# Patient Record
Sex: Female | Born: 1975 | Race: Black or African American | Hispanic: No | Smoking: Former smoker
Health system: Southern US, Community
[De-identification: ages and names within clinical notes are randomized; demographics above are authoritative.]

## PROBLEM LIST (undated history)

## (undated) DIAGNOSIS — E0590 Thyrotoxicosis, unspecified without thyrotoxic crisis or storm: Secondary | ICD-10-CM

## (undated) DIAGNOSIS — M169 Osteoarthritis of hip, unspecified: Secondary | ICD-10-CM

## (undated) DIAGNOSIS — F50819 Binge eating disorder, unspecified: Secondary | ICD-10-CM

## (undated) DIAGNOSIS — J302 Other seasonal allergic rhinitis: Secondary | ICD-10-CM

## (undated) DIAGNOSIS — G4730 Sleep apnea, unspecified: Secondary | ICD-10-CM

## (undated) DIAGNOSIS — K219 Gastro-esophageal reflux disease without esophagitis: Secondary | ICD-10-CM

## (undated) DIAGNOSIS — J310 Chronic rhinitis: Secondary | ICD-10-CM

## (undated) DIAGNOSIS — F32A Depression, unspecified: Secondary | ICD-10-CM

## (undated) DIAGNOSIS — L80 Vitiligo: Secondary | ICD-10-CM

## (undated) DIAGNOSIS — R7303 Prediabetes: Secondary | ICD-10-CM

## (undated) DIAGNOSIS — G4710 Hypersomnia, unspecified: Secondary | ICD-10-CM

## (undated) DIAGNOSIS — G4733 Obstructive sleep apnea (adult) (pediatric): Secondary | ICD-10-CM

## (undated) DIAGNOSIS — R32 Unspecified urinary incontinence: Secondary | ICD-10-CM

## (undated) DIAGNOSIS — E669 Obesity, unspecified: Secondary | ICD-10-CM

## (undated) DIAGNOSIS — I10 Essential (primary) hypertension: Secondary | ICD-10-CM

## (undated) DIAGNOSIS — E0500 Thyrotoxicosis with diffuse goiter without thyrotoxic crisis or storm: Secondary | ICD-10-CM

## (undated) DIAGNOSIS — F419 Anxiety disorder, unspecified: Secondary | ICD-10-CM

## (undated) HISTORY — PX: TOOTH EXTRACTION: SUR596

## (undated) HISTORY — DX: Hypersomnia, unspecified: G47.10

## (undated) HISTORY — DX: Obstructive sleep apnea (adult) (pediatric): G47.33

## (undated) HISTORY — DX: Unspecified urinary incontinence: R32

## (undated) HISTORY — DX: Other seasonal allergic rhinitis: J30.2

## (undated) HISTORY — DX: Chronic rhinitis: J31.0

## (undated) HISTORY — DX: Hypersomnia, unspecified: G47.30

## (undated) HISTORY — DX: Anxiety disorder, unspecified: F41.9

## (undated) HISTORY — DX: Thyrotoxicosis with diffuse goiter without thyrotoxic crisis or storm: E05.00

## (undated) HISTORY — DX: Vitiligo: L80

## (undated) HISTORY — DX: Thyrotoxicosis, unspecified without thyrotoxic crisis or storm: E05.90

## (undated) HISTORY — PX: CHOLECYSTECTOMY: SHX55

## (undated) HISTORY — DX: Depression, unspecified: F32.A

## (undated) HISTORY — DX: Prediabetes: R73.03

## (undated) HISTORY — DX: Osteoarthritis of hip, unspecified: M16.9

## (undated) HISTORY — DX: Obesity, unspecified: E66.9

## (undated) HISTORY — DX: Gastro-esophageal reflux disease without esophagitis: K21.9

## (undated) HISTORY — DX: Binge eating disorder, unspecified: F50.819

---

## 1998-03-18 ENCOUNTER — Emergency Department (HOSPITAL_COMMUNITY): Admission: EM | Admit: 1998-03-18 | Discharge: 1998-03-18 | Payer: Self-pay | Admitting: Emergency Medicine

## 1998-06-17 ENCOUNTER — Emergency Department (HOSPITAL_COMMUNITY): Admission: EM | Admit: 1998-06-17 | Discharge: 1998-06-17 | Payer: Self-pay | Admitting: Emergency Medicine

## 1998-11-28 ENCOUNTER — Other Ambulatory Visit: Admission: RE | Admit: 1998-11-28 | Discharge: 1998-11-28 | Payer: Self-pay | Admitting: Obstetrics

## 1999-02-06 ENCOUNTER — Other Ambulatory Visit: Admission: RE | Admit: 1999-02-06 | Discharge: 1999-02-06 | Payer: Self-pay | Admitting: Obstetrics

## 1999-03-27 ENCOUNTER — Encounter: Payer: Self-pay | Admitting: Emergency Medicine

## 1999-03-27 ENCOUNTER — Emergency Department (HOSPITAL_COMMUNITY): Admission: EM | Admit: 1999-03-27 | Discharge: 1999-03-27 | Payer: Self-pay | Admitting: Emergency Medicine

## 1999-07-26 ENCOUNTER — Emergency Department (HOSPITAL_COMMUNITY): Admission: EM | Admit: 1999-07-26 | Discharge: 1999-07-26 | Payer: Self-pay | Admitting: Obstetrics

## 1999-11-20 ENCOUNTER — Other Ambulatory Visit: Admission: RE | Admit: 1999-11-20 | Discharge: 1999-11-20 | Payer: Self-pay | Admitting: Obstetrics

## 2000-03-27 ENCOUNTER — Inpatient Hospital Stay (HOSPITAL_COMMUNITY): Admission: EM | Admit: 2000-03-27 | Discharge: 2000-03-27 | Payer: Self-pay | Admitting: Obstetrics

## 2001-05-24 ENCOUNTER — Emergency Department (HOSPITAL_COMMUNITY): Admission: EM | Admit: 2001-05-24 | Discharge: 2001-05-24 | Payer: Self-pay | Admitting: Emergency Medicine

## 2001-07-11 ENCOUNTER — Encounter: Admission: RE | Admit: 2001-07-11 | Discharge: 2001-10-03 | Payer: Self-pay | Admitting: Orthopedic Surgery

## 2001-08-08 ENCOUNTER — Emergency Department (HOSPITAL_COMMUNITY): Admission: EM | Admit: 2001-08-08 | Discharge: 2001-08-08 | Payer: Self-pay | Admitting: *Deleted

## 2001-08-14 ENCOUNTER — Encounter: Admission: RE | Admit: 2001-08-14 | Discharge: 2001-08-14 | Payer: Self-pay | Admitting: Cardiology

## 2001-08-14 ENCOUNTER — Encounter: Payer: Self-pay | Admitting: Cardiology

## 2001-11-03 ENCOUNTER — Ambulatory Visit (HOSPITAL_BASED_OUTPATIENT_CLINIC_OR_DEPARTMENT_OTHER): Admission: RE | Admit: 2001-11-03 | Discharge: 2001-11-03 | Payer: Self-pay | Admitting: Cardiology

## 2001-11-25 ENCOUNTER — Emergency Department (HOSPITAL_COMMUNITY): Admission: EM | Admit: 2001-11-25 | Discharge: 2001-11-25 | Payer: Self-pay | Admitting: Emergency Medicine

## 2002-02-05 ENCOUNTER — Ambulatory Visit (HOSPITAL_BASED_OUTPATIENT_CLINIC_OR_DEPARTMENT_OTHER): Admission: RE | Admit: 2002-02-05 | Discharge: 2002-02-05 | Payer: Self-pay | Admitting: Internal Medicine

## 2002-03-12 ENCOUNTER — Encounter: Payer: Self-pay | Admitting: Emergency Medicine

## 2002-03-12 ENCOUNTER — Emergency Department (HOSPITAL_COMMUNITY): Admission: EM | Admit: 2002-03-12 | Discharge: 2002-03-12 | Payer: Self-pay | Admitting: Emergency Medicine

## 2002-07-13 ENCOUNTER — Encounter: Payer: Self-pay | Admitting: Cardiology

## 2002-07-13 ENCOUNTER — Encounter: Admission: RE | Admit: 2002-07-13 | Discharge: 2002-07-13 | Payer: Self-pay | Admitting: Cardiology

## 2003-01-10 ENCOUNTER — Emergency Department (HOSPITAL_COMMUNITY): Admission: EM | Admit: 2003-01-10 | Discharge: 2003-01-10 | Payer: Self-pay | Admitting: Emergency Medicine

## 2003-03-25 ENCOUNTER — Emergency Department (HOSPITAL_COMMUNITY): Admission: EM | Admit: 2003-03-25 | Discharge: 2003-03-26 | Payer: Self-pay

## 2004-04-29 ENCOUNTER — Emergency Department (HOSPITAL_COMMUNITY): Admission: EM | Admit: 2004-04-29 | Discharge: 2004-04-29 | Payer: Self-pay | Admitting: Emergency Medicine

## 2006-06-30 ENCOUNTER — Inpatient Hospital Stay (HOSPITAL_COMMUNITY): Admission: AD | Admit: 2006-06-30 | Discharge: 2006-07-03 | Payer: Self-pay | Admitting: Obstetrics

## 2008-01-31 ENCOUNTER — Emergency Department (HOSPITAL_COMMUNITY): Admission: EM | Admit: 2008-01-31 | Discharge: 2008-01-31 | Payer: Self-pay | Admitting: Emergency Medicine

## 2010-06-11 ENCOUNTER — Emergency Department (HOSPITAL_COMMUNITY): Admission: EM | Admit: 2010-06-11 | Discharge: 2010-06-11 | Payer: Self-pay | Admitting: Emergency Medicine

## 2010-10-16 ENCOUNTER — Ambulatory Visit
Admission: RE | Admit: 2010-10-16 | Discharge: 2010-10-16 | Payer: Self-pay | Source: Home / Self Care | Attending: Internal Medicine | Admitting: Internal Medicine

## 2010-10-16 DIAGNOSIS — G4710 Hypersomnia, unspecified: Secondary | ICD-10-CM | POA: Insufficient documentation

## 2010-10-16 DIAGNOSIS — G4730 Sleep apnea, unspecified: Secondary | ICD-10-CM

## 2010-10-17 ENCOUNTER — Encounter: Payer: Self-pay | Admitting: Internal Medicine

## 2010-10-17 ENCOUNTER — Ambulatory Visit (HOSPITAL_BASED_OUTPATIENT_CLINIC_OR_DEPARTMENT_OTHER)
Admission: RE | Admit: 2010-10-17 | Discharge: 2010-10-17 | Payer: Self-pay | Source: Home / Self Care | Attending: Internal Medicine | Admitting: Internal Medicine

## 2010-11-01 ENCOUNTER — Ambulatory Visit
Admission: RE | Admit: 2010-11-01 | Discharge: 2010-11-01 | Payer: Self-pay | Source: Home / Self Care | Attending: Internal Medicine | Admitting: Internal Medicine

## 2010-11-01 DIAGNOSIS — R404 Transient alteration of awareness: Secondary | ICD-10-CM | POA: Insufficient documentation

## 2010-11-09 NOTE — Assessment & Plan Note (Signed)
Summary: sleep study f/u ///kp   Copy to:  Maurice Small MD Primary Provider/Referring Provider:  Maurice Small MD  CC:  Follow upvisit-sleep study results..  History of Present Illness:  CC:  Sleep Consult. Pt states she hasn't used her cpap for more than 10 years. Pt c/o fatigue, frequent migraines, and short term memory focusing. Marland Kitchen  History of Present Illness: October 16, 2010- Sleep Consult at kind request of Dr Maurice Small for this 6 yoF with obstructive sleep apnea. . Pt states she hasn't used her cpap for more than 10 years. Pt c/o fatigue, frequent migraines, short term memory focusing, loud snoring Former patient at the old practice. NPSG 11/03/01 RDI 45/hr with CPAP to 15.  She didn't returnfor follow-up after finding CPAP uncomfortable. She now admits tiredness while driving or sitting quietly. Bedtime 10-12MN, latency 1-2 minutes, waking 1-2 times before waking at 6 AM. No ENT surgery except dental extraction.   November 01, 2010- OSA Nurse-CC: Follow up visit-sleep study results. NPSG- 10/17/10- AHI 0.9/hr- some leg jerks, but not OSA. Dozes off easily in car. Bedtime around 1130, up 7AM sleeps through night but tired everyday. It interferes with on-line school. Never an alert time of day. No caffeine- migraine trigger. Thyroid ok. Work sits at [employer]. Denies cataplexy.   Preventive Screening-Counseling & Management  Alcohol-Tobacco     Smoking Status: quit     Packs/Day: 0.5     Year Started: early 20's     Year Quit: 2001  Current Medications (verified): 1)  Tylenol Allergy Sinus 2-30-500 Mg Tabs (Chlorphen-Pseudoephed-Apap) .... As Needed 2)  Fluticasone Propionate 50 Mcg/act Susp (Fluticasone Propionate) .Marland Kitchen.. 1 Puff Each Nostrile Once Daily 3)  Sprintec 28 0.25-35 Mg-Mcg Tabs (Norgestimate-Eth Estradiol) .... Once Daily 4)  Imitrex 25 Mg Tabs (Sumatriptan Succinate) .... As Needed  Allergies (verified): No Known Drug Allergies  Past History:  Past  Surgical History: Last updated: 10/16/2010 Cholecystectomy tooth extraction  Family History: Last updated: 10/16/2010 diabetes: father, mother  Father- died DM htn: mother, brother COPD: mother high cholesterol: brother Rheumatism--mother  Social History: Last updated: 10/16/2010 Patient states former smoker. 2001. 1/2 ppd Occupation: [employer] married- husband has had second dual lung transplant children: 3 natural, 3 step  Risk Factors: Smoking Status: quit (11/01/2010) Packs/Day: 0.5 (11/01/2010)  Past Medical History: Asthma Rhinitis GERD Migraine Hypersomnia with obstructive sleep apnea- NPSG 11/05/01- AHI 45/hr                                                                        NPSG 10/17/10- WNL AHI 0.9/hr  Review of Systems      See HPI  The patient denies shortness of breath with activity, shortness of breath at rest, productive cough, non-productive cough, coughing up blood, chest pain, irregular heartbeats, acid heartburn, indigestion, loss of appetite, weight change, abdominal pain, difficulty swallowing, sore throat, tooth/dental problems, headaches, nasal congestion/difficulty breathing through nose, and sneezing.    Vital Signs:  Patient profile:   35 year old female Height:      64 inches Weight:      303.25 pounds BMI:     52.24 O2 Sat:      100 % on Room air Pulse rate:  77 / minute BP sitting:   122 / 82  (left arm) Cuff size:   large  Vitals Entered By: Reynaldo Minium CMA (November 01, 2010 9:50 AM)  O2 Flow:  Room air CC: Follow upvisit-sleep study results.   Physical Exam  Additional Exam:  General: A/Ox3; pleasant and cooperative, NAD, obese, intelligent  SKIN: no rash, lesions NODES: no lymphadenopathy HEENT: [place]/AT, EOM- WNL, Conjuctivae- clear, PERRLA, TM-WNL, Nose- clear, Throat- clear and wnl, Mallampati  IV NECK: Supple w/ fair ROM, JVD- none, normal carotid impulses w/o bruits Thyroid- normal to palpation CHEST: Clear to  P&A, clear, unlabored, no cough or wheeze HEART: RRR, no m/g/r heard ABDOMEN: Soft and nl;  ZOX:WRUE, nl pulses, no edema  NEURO: Grossly intact to observation      Impression & Recommendations:  Problem # 1:  HYPERSOMNIA WITH SLEEP APNEA UNSPECIFIED (ICD-780.53)  We can't demonstrate OSA now. We have worked on sleep hygiene and ways including contolled naps, exercise, caffeine, to stay awake in daytime especially for driving safety and work. We will have her try light dose caffeine. Considering need for an MSLT looking for ideopathic hypersomnia. She assures me thyroid tested normal. Weight loss and regular exercise may help. Disucssed how to control naps at work.   Other Orders: Est. Patient Level IV (45409)  Patient Instructions: 1)  Please schedule a follow-up appointment in 6 months. 2)  If the sslpeepiness doesn't get better, then we can discuss a different kind of sleep test.  3)  Try contoilled naps at lunch time 4)  Try light use of caffeine as tolerated. One way is to get caffeine tablet "caplets" otc. You can break these in half to adjust dose and take a couple of times a day if needed.

## 2010-11-09 NOTE — Assessment & Plan Note (Signed)
Summary: f/u and reset cpap/jd   Visit Type:  Initial Consult Copy to:  Maurice Small MD Primary Provider/Referring Provider:  Maurice Small MD  CC:  Sleep Consult. Pt states she hasn't used her cpap for more than 10 years. Pt c/o fatigue, frequent migraines, and short term memory focusing. Marland Kitchen  History of Present Illness: October 16, 2010- Sleep Consult at kind request of Dr Maurice Small for this 35 yoF with obstructive sleep apnea. . Pt states she hasn't used her cpap for more than 10 years. Pt c/o fatigue, frequent migraines, short term memory focusing, loud snoring Former patient at the old practice. NPSG 11/03/01 RDI 45/hr with CPAP to 15.  She didn't returnfor follow-up after finding CPAP uncomfortable. She now admits tiredness while driving or sitting quietly. Bedtime 10-12MN, latency 1-2 minutes, waking 1-2 times before waking at 6 AM. No ENT surgery except dental extraction.   Preventive Screening-Counseling & Management  Alcohol-Tobacco     Smoking Status: quit     Packs/Day: 0.5     Year Started: early 20's     Year Quit: 2001  Current Medications (verified): 1)  Tylenol Allergy Sinus 2-30-500 Mg Tabs (Chlorphen-Pseudoephed-Apap) .... As Needed 2)  Fluticasone Propionate 50 Mcg/act Susp (Fluticasone Propionate) .Marland Kitchen.. 1 Puff Each Nostrile Once Daily 3)  Sprintec 28 0.25-35 Mg-Mcg Tabs (Norgestimate-Eth Estradiol) .... Once Daily 4)  Imitrex 25 Mg Tabs (Sumatriptan Succinate) .... As Needed  Allergies (verified): No Known Drug Allergies  Past History:  Family History: Last updated: 10/16/2010 diabetes: father, mother  Father- died DM htn: mother, brother COPD: mother high cholesterol: brother Rheumatism--mother  Social History: Last updated: 10/16/2010 Patient states former smoker. 2001. 1/2 ppd Occupation: [employer] married- husband has had second dual lung transplant children: 3 natural, 3 step  Risk Factors: Smoking Status: quit  (10/16/2010) Packs/Day: 0.5 (10/16/2010)  Past Medical History: Asthma Rhinitis GERD Migraine Obstructive sleep apnea.  Past Surgical History: Cholecystectomy tooth extraction  Family History: diabetes: father, mother  Father- died DM htn: mother, brother COPD: mother high cholesterol: brother Rheumatism--mother  Social History: Patient states former smoker. 2001. 1/2 ppd Occupation: [employer] married- husband has had second dual lung transplant children: 3 natural, 3 step Packs/Day:  0.5  Review of Systems      See HPI       The patient complains of headaches.  The patient denies shortness of breath with activity, shortness of breath at rest, productive cough, non-productive cough, coughing up blood, chest pain, irregular heartbeats, acid heartburn, indigestion, loss of appetite, weight change, abdominal pain, difficulty swallowing, sore throat, tooth/dental problems, nasal congestion/difficulty breathing through nose, sneezing, itching, ear ache, anxiety, depression, hand/feet swelling, joint stiffness or pain, rash, change in color of mucus, and fever.    Vital Signs:  Patient profile:   35 year old female Height:      64 inches Weight:      297.50 pounds BMI:     51.25 O2 Sat:      100 % on Room air Pulse rate:   88 / minute BP sitting:   160 / 90  (left arm) Cuff size:   large  Vitals Entered By: Carver Fila (October 16, 2010 3:48 PM)  O2 Flow:  Room air CC: Sleep Consult. Pt states she hasn't used her cpap for more than 10 years. Pt c/o fatigue, frequent migraines, short term memory focusing.  Comments meds and allergies updated Phone number updated [facility]  October 16, 2010 3:48 PM    Physical Exam  Additional Exam:  General: A/Ox3; pleasant and cooperative, NAD, obese, intelligent  SKIN: no rash, lesions NODES: no lymphadenopathy HEENT: [place]/AT, EOM- WNL, Conjuctivae- clear, PERRLA, TM-WNL, Nose- clear, Throat- clear and wnl, Mallampati   IV NECK: Supple w/ fair ROM, JVD- none, normal carotid impulses w/o bruits Thyroid- normal to palpation CHEST: Clear to P&A, clear, unlabored, no cough or wheeze HEART: RRR, no m/g/r heard ABDOMEN: Soft and nl; nml bowel sounds; no organomegaly or masses noted QIO:NGEX, nl pulses, no edema  NEURO: Grossly intact to observation      Impression & Recommendations:  Problem # 1:  HYPERSOMNIA WITH SLEEP APNEA UNSPECIFIED (ICD-780.53)  We reviewed the physiology and diagnostic process and will get an updated sleep study since her last was 8 years ago. We discussed weight loss as a goal and discussed her responsibility to drive safely.   Medications Added to Medication List This Visit: 1)  Imitrex 25 Mg Tabs (Sumatriptan succinate) .... As needed  Other Orders: Est. Patient Level IV (52841) Sleep Study (Sleep Study)  Patient Instructions: 1)  Please schedule a follow-up appointment in 1 month. 2)  See [facility] to schedule sleep study.    Immunization History:  Influenza Immunization History:    Influenza:  historical (07/08/2010)

## 2011-05-02 ENCOUNTER — Ambulatory Visit: Payer: Self-pay | Admitting: Internal Medicine

## 2012-01-28 ENCOUNTER — Other Ambulatory Visit (HOSPITAL_COMMUNITY): Payer: Self-pay | Admitting: Obstetrics

## 2012-01-28 DIAGNOSIS — Z1231 Encounter for screening mammogram for malignant neoplasm of breast: Secondary | ICD-10-CM

## 2012-02-20 ENCOUNTER — Ambulatory Visit (HOSPITAL_COMMUNITY): Payer: Self-pay | Attending: Obstetrics

## 2013-02-14 ENCOUNTER — Emergency Department (HOSPITAL_COMMUNITY): Payer: 59

## 2013-02-14 ENCOUNTER — Emergency Department (HOSPITAL_COMMUNITY)
Admission: EM | Admit: 2013-02-14 | Discharge: 2013-02-14 | Disposition: A | Discharge status: Home / Self Care | Payer: 59 | Attending: Emergency Medicine | Admitting: Emergency Medicine

## 2013-02-14 ENCOUNTER — Encounter (HOSPITAL_COMMUNITY): Payer: Self-pay

## 2013-02-14 DIAGNOSIS — Z8719 Personal history of other diseases of the digestive system: Secondary | ICD-10-CM | POA: Insufficient documentation

## 2013-02-14 DIAGNOSIS — Z8669 Personal history of other diseases of the nervous system and sense organs: Secondary | ICD-10-CM | POA: Insufficient documentation

## 2013-02-14 DIAGNOSIS — J45909 Unspecified asthma, uncomplicated: Secondary | ICD-10-CM | POA: Insufficient documentation

## 2013-02-14 DIAGNOSIS — G8929 Other chronic pain: Secondary | ICD-10-CM

## 2013-02-14 DIAGNOSIS — G43909 Migraine, unspecified, not intractable, without status migrainosus: Secondary | ICD-10-CM | POA: Insufficient documentation

## 2013-02-14 DIAGNOSIS — M25579 Pain in unspecified ankle and joints of unspecified foot: Secondary | ICD-10-CM | POA: Insufficient documentation

## 2013-02-14 DIAGNOSIS — Z87891 Personal history of nicotine dependence: Secondary | ICD-10-CM | POA: Insufficient documentation

## 2013-02-14 DIAGNOSIS — J310 Chronic rhinitis: Secondary | ICD-10-CM | POA: Insufficient documentation

## 2013-02-14 DIAGNOSIS — Z79899 Other long term (current) drug therapy: Secondary | ICD-10-CM | POA: Insufficient documentation

## 2013-02-14 MED ORDER — TRAMADOL HCL 50 MG PO TABS
50.0000 mg | ORAL_TABLET | Freq: Once | ORAL | Status: AC
  Administered 2013-02-14: 50 mg via ORAL
  Filled 2013-02-14: qty 1

## 2013-02-14 MED ORDER — TRAMADOL HCL 50 MG PO TABS: 50.0000 mg | ORAL_TABLET | Freq: Four times a day (QID) | ORAL | Status: DC | PRN

## 2013-02-14 MED ORDER — IBUPROFEN 200 MG PO TABS
400.0000 mg | ORAL_TABLET | Freq: Once | ORAL | Status: AC
  Administered 2013-02-14: 400 mg via ORAL
  Filled 2013-02-14: qty 2

## 2013-02-14 NOTE — ED Notes (Signed)
Pt c/o LT ankle pain "for a while."  States she was dx'd w/bursitis.  States she did jumping jacks yesterday and made it worse.

## 2013-02-14 NOTE — ED Provider Notes (Signed)
History     CSN: [id number]  Arrival date & time 02/14/13  1722   First MD Initiated Contact with Patient 02/14/13 1736      Chief Complaint  Patient presents with  . Ankle Pain    (Consider location/radiation/quality/duration/timing/severity/associated sxs/prior treatment) HPI  Alicia Hoover is a 37 y.o. female complaining of exacerbation of left Achilles pain over the last 24 hours. Patient states she has a history of bursitis in that area she was jumping rope with her child yesterday and the pain has become more severe, it is rated as 6/10, she has not taken any pain medication. She denies rolling the ankle, numbness, paresthesia.  Past Medical History  Diagnosis Date  . Asthma   . Rhinitis   . GERD (gastroesophageal reflux disease)   . Migraine   . Hypersomnia with sleep apnea     Past Surgical History  Procedure Laterality Date  . Cholecystectomy    . Tooth extraction      Family History  Problem Relation Age of Onset  . Diabetes Father   . Diabetes Mother   . COPD Mother   . Hyperlipidemia Brother   . Rheum arthritis Mother     History  Substance Use Topics  . Smoking status: Former Smoker    Quit date: 10/09/1999  . Smokeless tobacco: Not on file  . Alcohol Use: No    OB History   Grav Para Term Preterm Abortions TAB SAB Ect Mult Living                  Review of Systems  Constitutional: Negative for fever.  Respiratory: Negative for shortness of breath.   Cardiovascular: Negative for chest pain.  Gastrointestinal: Negative for nausea, vomiting, abdominal pain and diarrhea.  Musculoskeletal: Positive for arthralgias.  All other systems reviewed and are negative.    Allergies  Review of patient's allergies indicates not on file.  Home Medications   Current Outpatient Rx  Name  Route  Sig  Dispense  Refill  . chlorpheniramine-pseudoephedrine-acetaminophen (SINUTAB) 2-30-500 MG per tablet   Oral   Take 1 tablet by mouth every 4 (four)  hours as needed.           . fluticasone (FLOVENT DISKUS) 50 MCG/BLIST diskus inhaler   Inhalation   Inhale 1 puff into the lungs daily.           . norgestimate-ethinyl estradiol (SPRINTEC 28) 0.25-35 MG-MCG per tablet   Oral   Take 1 tablet by mouth daily.           . SUMAtriptan (IMITREX) 25 MG tablet   Oral   Take 25 mg by mouth as needed.             BP 124/72  Pulse 98  Temp(Src) 98.2 F (36.8 C) (Oral)  SpO2 99%  LMP 01/23/2013  Physical Exam  Nursing note and vitals reviewed. Constitutional: She is oriented to person, place, and time. She appears well-developed and well-nourished. No distress.  HENT:  Head: Normocephalic.  Eyes: Conjunctivae and EOM are normal.  Cardiovascular: Normal rate.   Pulmonary/Chest: Effort normal. No stridor.  Musculoskeletal: Normal range of motion.  Patient and relates with mildly antalgic gait. No deformity to the left ankle, neurovascularly intact, excellent range of motion to toes and ankle. Patient has tenderness to palpation of the Achilles tendon, pain with dorsi flexion.   Neurological: She is alert and oriented to person, place, and time.  Psychiatric: She has a normal  mood and affect.    ED Course  Procedures (including critical care time)  Labs Reviewed - No data to display Dg Ankle Complete Left  02/14/2013  *RADIOLOGY REPORT*  Clinical Data: Heel pain.  LEFT ANKLE COMPLETE - 3+ VIEW  Comparison: None.  Findings: Mild soft tissue swelling is present over lateral malleolus.  There is no underlying fracture.  Degenerative changes are noted at the Achilles tendon insertion.  The calcaneus is otherwise unremarkable.  There is no significant ankle effusion. The ankle joint is located.  IMPRESSION:  1.  Mild soft tissue swelling over lateral malleolus without an underlying fracture. 2.  No acute abnormality of the calcaneus.   Original Report Authenticated By: Marin Roberts, M.D.      1. Ankle pain, chronic, left        MDM   Alicia Hoover is a 37 y.o. female with exacerbation of chronic left Achilles tendon pain. Plain films are negative. Pain is well-controlled with Motrin and tramadol. Orthopedic followup given.    Filed Vitals:   02/14/13 1735  BP: 124/72  Pulse: 98  Temp: 98.2 F (36.8 C)  TempSrc: Oral  SpO2: 99%     VSS and patient is appropriate for, and amenable to, discharge at this time. Pt verbalized understanding and agrees with care plan. Outpatient follow-up and return precautions given.    Discharge Medication List as of 02/14/2013  6:46 PM    START taking these medications   Details  traMADol (ULTRAM) 50 MG tablet Take 1 tablet (50 mg total) by mouth every 6 (six) hours as needed for pain., Starting 02/14/2013, Until Discontinued, Print              Alicia Emery, PA-C 02/14/13 1949

## 2013-02-14 NOTE — ED Notes (Signed)
Pt reports she was exercising, using jump rope yesterday and she has L ankle pain rated 6/10 today.

## 2013-02-15 NOTE — ED Provider Notes (Signed)
Medical screening examination/treatment/procedure(s) were performed by non-physician practitioner and as supervising physician I was immediately available for consultation/collaboration.  Journee Bobrowski, MD 02/15/13 1542 

## 2013-02-19 ENCOUNTER — Other Ambulatory Visit (HOSPITAL_COMMUNITY): Payer: Self-pay | Admitting: Obstetrics

## 2013-02-19 DIAGNOSIS — Z1231 Encounter for screening mammogram for malignant neoplasm of breast: Secondary | ICD-10-CM

## 2013-02-20 ENCOUNTER — Ambulatory Visit (HOSPITAL_COMMUNITY)
Admission: RE | Admit: 2013-02-20 | Discharge: 2013-02-20 | Disposition: A | Discharge status: Home / Self Care | Payer: 59 | Source: Ambulatory Visit | Attending: Obstetrics | Admitting: Obstetrics

## 2013-02-20 DIAGNOSIS — Z1231 Encounter for screening mammogram for malignant neoplasm of breast: Secondary | ICD-10-CM | POA: Insufficient documentation

## 2013-06-18 ENCOUNTER — Emergency Department (HOSPITAL_COMMUNITY)
Admission: EM | Admit: 2013-06-18 | Discharge: 2013-06-18 | Disposition: A | Discharge status: Home / Self Care | Payer: 59 | Attending: Emergency Medicine | Admitting: Emergency Medicine

## 2013-06-18 ENCOUNTER — Encounter (HOSPITAL_COMMUNITY): Payer: Self-pay | Admitting: Family Medicine

## 2013-06-18 DIAGNOSIS — M545 Low back pain, unspecified: Secondary | ICD-10-CM | POA: Insufficient documentation

## 2013-06-18 DIAGNOSIS — Z87891 Personal history of nicotine dependence: Secondary | ICD-10-CM | POA: Insufficient documentation

## 2013-06-18 DIAGNOSIS — J45909 Unspecified asthma, uncomplicated: Secondary | ICD-10-CM | POA: Insufficient documentation

## 2013-06-18 DIAGNOSIS — I10 Essential (primary) hypertension: Secondary | ICD-10-CM | POA: Insufficient documentation

## 2013-06-18 DIAGNOSIS — Z8719 Personal history of other diseases of the digestive system: Secondary | ICD-10-CM | POA: Insufficient documentation

## 2013-06-18 DIAGNOSIS — Z79899 Other long term (current) drug therapy: Secondary | ICD-10-CM | POA: Insufficient documentation

## 2013-06-18 DIAGNOSIS — IMO0002 Reserved for concepts with insufficient information to code with codable children: Secondary | ICD-10-CM | POA: Insufficient documentation

## 2013-06-18 HISTORY — DX: Essential (primary) hypertension: I10

## 2013-06-18 MED ORDER — METHOCARBAMOL 500 MG PO TABS: 500.0000 mg | ORAL_TABLET | Freq: Two times a day (BID) | ORAL | Status: DC | PRN

## 2013-06-18 MED ORDER — OXYCODONE-ACETAMINOPHEN 5-325 MG PO TABS
2.0000 | ORAL_TABLET | Freq: Once | ORAL | Status: AC
  Administered 2013-06-18: 2 via ORAL
  Filled 2013-06-18: qty 2

## 2013-06-18 MED ORDER — TRAMADOL HCL 50 MG PO TABS: 50.0000 mg | ORAL_TABLET | Freq: Four times a day (QID) | ORAL | Status: DC | PRN

## 2013-06-18 MED ORDER — MELOXICAM 7.5 MG PO TABS: 15.0000 mg | ORAL_TABLET | Freq: Every day | ORAL | Status: DC

## 2013-06-18 NOTE — ED Provider Notes (Signed)
Medical screening examination/treatment/procedure(s) were performed by non-physician practitioner and as supervising physician I was immediately available for consultation/collaboration.  Artez Regis M Jobe Mutch, MD 06/18/13 0644 

## 2013-06-18 NOTE — ED Notes (Signed)
Patient states that she has had lower left side back pain for 3 days. States pain in left leg with ambulation. Denies dysuria or fever. Took Tylenol at 945pm and put Flector patch on her back without relief of symptoms.

## 2013-06-18 NOTE — ED Provider Notes (Signed)
CSN: [id number]     Arrival date & time 06/18/13  0034 History   First MD Initiated Contact with Patient 06/18/13 0054     Chief Complaint  Patient presents with  . Back Pain   (Consider location/radiation/quality/duration/timing/severity/associated sxs/prior Treatment) HPI Comments: Patient is a 37 y/o female who presents for L lumbar back pain x 3 days. Patient states that pain is intermittent, throbbing/sharp, and radiating to her L hip. Patient states pain is improved with nonmovement and worsened when ambulating. She has tried OTC antiinflammatories without relief. She denies any trauma or injury inciting her pain. Patient further denies associated fever, dysuria, hematuria, N/V, diarrhea, vaginal c/o, perianal numbness, bowel/bladder incontinence, numbess/tingling, and a hx of CA and IVDU. Patient has been ambulatory without assistance.  Patient is a 37 y.o. female presenting with back pain. The history is provided by the patient. No language interpreter was used.  Back Pain Associated symptoms: no dysuria, no fever, no numbness and no weakness     Past Medical History  Diagnosis Date  . Asthma   . Rhinitis   . GERD (gastroesophageal reflux disease)   . Migraine   . Hypersomnia with sleep apnea   . Hypertension    Past Surgical History  Procedure Laterality Date  . Cholecystectomy    . Tooth extraction     Family History  Problem Relation Age of Onset  . Diabetes Father   . Diabetes Mother   . COPD Mother   . Hyperlipidemia Brother   . Rheum arthritis Mother    History  Substance Use Topics  . Smoking status: Former Smoker    Quit date: 10/09/1999  . Smokeless tobacco: Not on file  . Alcohol Use: No   OB History   Grav Para Term Preterm Abortions TAB SAB Ect Mult Living                 Review of Systems  Constitutional: Negative for fever.  Gastrointestinal: Negative for nausea and vomiting.  Genitourinary: Negative for dysuria.  Musculoskeletal: Positive  for back pain.  Neurological: Negative for weakness and numbness.  All other systems reviewed and are negative.    Allergies  Codeine  Home Medications   Current Outpatient Rx  Name  Route  Sig  Dispense  Refill  . benazepril-hydrochlorthiazide (LOTENSIN HCT) 20-12.5 MG per tablet   Oral   Take 1 tablet by mouth daily.         . cetirizine (ZYRTEC) 10 MG tablet   Oral   Take 10 mg by mouth daily.         . montelukast (SINGULAIR) 10 MG tablet   Oral   Take 10 mg by mouth at bedtime.         . norgestimate-ethinyl estradiol (SPRINTEC 28) 0.25-35 MG-MCG per tablet   Oral   Take 1 tablet by mouth daily.           . fluticasone (FLONASE) 50 MCG/ACT nasal spray   Nasal   Place 2 sprays into the nose daily as needed for allergies.          . meloxicam (MOBIC) 7.5 MG tablet   Oral   Take 2 tablets (15 mg total) by mouth daily.   30 tablet   0   . methocarbamol (ROBAXIN) 500 MG tablet   Oral   Take 1 tablet (500 mg total) by mouth 2 (two) times daily as needed.   20 tablet   0   . traMADol (ULTRAM)  50 MG tablet   Oral   Take 1 tablet (50 mg total) by mouth every 6 (six) hours as needed for pain.   13 tablet   0    BP 118/78  Pulse 74  Temp(Src) 98.5 F (36.9 C) (Oral)  Resp 21  Ht 5\' 5"  (1.651 m)  Wt 315 lb (142.883 kg)  BMI 52.42 kg/m2  SpO2 96%  LMP 06/14/2013  Physical Exam  Nursing note and vitals reviewed. Constitutional: She is oriented to person, place, and time. She appears well-developed and well-nourished. No distress.  HENT:  Head: Normocephalic and atraumatic.  Eyes: Conjunctivae and EOM are normal. No scleral icterus.  Neck: Normal range of motion.  Cardiovascular: Normal rate, regular rhythm and intact distal pulses.   DP and PT pulses 2+ bilaterally  Pulmonary/Chest: Effort normal. No respiratory distress.  Musculoskeletal: Normal range of motion.  Tenderness to palpation of the left lumbar paraspinal muscles. No tenderness to  palpation of the lumbosacral midline. No bony deformities or step-offs palpated.  Neurological: She is alert and oriented to person, place, and time.  No sensory or motor deficits appreciated. DTRs normal and symmetric.  Skin: Skin is warm and dry. No rash noted. She is not diaphoretic. No erythema. No pallor.  Psychiatric: She has a normal mood and affect. Her behavior is normal.    ED Course  Procedures (including critical care time) Labs Review Labs Reviewed - No data to display  Imaging Review No results found.  MDM   1. Back pain, lumbosacral    Uncomplicated low-back pain. Patient well and nontoxic appearing, hemodynamically stable, and afebrile. Physical exam findings as above. Patient neurovascularly intact. No red flags or signs concerning for cauda equina. Patient denies history of cancer and IV drug use. Do not believe further workup with imaging is warranted at this time. Patient appropriate for discharge with instructions for rest, ice to the affected area, and anti-inflammatories as well as muscle relaxer for symptoms. Short course of tramadol prescribed for severe pain. Return precautions discussed and patient agreeable to plan with no unaddressed concerns.    Antony Madura, PA-C 06/18/13 [phone]

## 2013-08-13 ENCOUNTER — Other Ambulatory Visit: Payer: Self-pay

## 2013-12-11 ENCOUNTER — Emergency Department (HOSPITAL_COMMUNITY)
Admission: EM | Admit: 2013-12-11 | Discharge: 2013-12-11 | Discharge status: Absent without leave | Payer: 59 | Source: Home / Self Care

## 2013-12-29 ENCOUNTER — Emergency Department (HOSPITAL_COMMUNITY)
Admission: EM | Admit: 2013-12-29 | Discharge: 2013-12-29 | Disposition: A | Discharge status: Home / Self Care | Payer: 59 | Attending: Emergency Medicine | Admitting: Emergency Medicine

## 2013-12-29 ENCOUNTER — Encounter (HOSPITAL_COMMUNITY): Payer: Self-pay | Admitting: Emergency Medicine

## 2013-12-29 DIAGNOSIS — I10 Essential (primary) hypertension: Secondary | ICD-10-CM | POA: Insufficient documentation

## 2013-12-29 DIAGNOSIS — S4980XA Other specified injuries of shoulder and upper arm, unspecified arm, initial encounter: Secondary | ICD-10-CM | POA: Insufficient documentation

## 2013-12-29 DIAGNOSIS — S46909A Unspecified injury of unspecified muscle, fascia and tendon at shoulder and upper arm level, unspecified arm, initial encounter: Secondary | ICD-10-CM | POA: Insufficient documentation

## 2013-12-29 DIAGNOSIS — S0993XA Unspecified injury of face, initial encounter: Secondary | ICD-10-CM | POA: Insufficient documentation

## 2013-12-29 DIAGNOSIS — Z8719 Personal history of other diseases of the digestive system: Secondary | ICD-10-CM | POA: Insufficient documentation

## 2013-12-29 DIAGNOSIS — Z87891 Personal history of nicotine dependence: Secondary | ICD-10-CM | POA: Insufficient documentation

## 2013-12-29 DIAGNOSIS — J45909 Unspecified asthma, uncomplicated: Secondary | ICD-10-CM | POA: Insufficient documentation

## 2013-12-29 DIAGNOSIS — Z79899 Other long term (current) drug therapy: Secondary | ICD-10-CM | POA: Insufficient documentation

## 2013-12-29 DIAGNOSIS — T148XXA Other injury of unspecified body region, initial encounter: Secondary | ICD-10-CM

## 2013-12-29 DIAGNOSIS — S199XXA Unspecified injury of neck, initial encounter: Secondary | ICD-10-CM

## 2013-12-29 DIAGNOSIS — Y92410 Unspecified street and highway as the place of occurrence of the external cause: Secondary | ICD-10-CM | POA: Insufficient documentation

## 2013-12-29 DIAGNOSIS — IMO0002 Reserved for concepts with insufficient information to code with codable children: Secondary | ICD-10-CM | POA: Insufficient documentation

## 2013-12-29 DIAGNOSIS — H538 Other visual disturbances: Secondary | ICD-10-CM | POA: Insufficient documentation

## 2013-12-29 DIAGNOSIS — Y9389 Activity, other specified: Secondary | ICD-10-CM | POA: Insufficient documentation

## 2013-12-29 DIAGNOSIS — Z791 Long term (current) use of non-steroidal anti-inflammatories (NSAID): Secondary | ICD-10-CM | POA: Insufficient documentation

## 2013-12-29 MED ORDER — METHOCARBAMOL 500 MG PO TABS: 1000.0000 mg | ORAL_TABLET | Freq: Four times a day (QID) | ORAL | Status: DC

## 2013-12-29 MED ORDER — KETOROLAC TROMETHAMINE 60 MG/2ML IM SOLN
60.0000 mg | Freq: Once | INTRAMUSCULAR | Status: AC
  Administered 2013-12-29: 60 mg via INTRAMUSCULAR
  Filled 2013-12-29: qty 2

## 2013-12-29 MED ORDER — NAPROXEN 500 MG PO TABS: 500.0000 mg | ORAL_TABLET | Freq: Two times a day (BID) | ORAL | Status: DC
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
## 2013-12-30 NOTE — ED Provider Notes (Signed)
  Medical screening examination/treatment/procedure(s) were performed by non-physician practitioner and as supervising physician I was immediately available for consultation/collaboration.   EKG Interpretation None         Carmin Muskrat, MD 12/30/13 0002

## 2014-01-20 LAB — HM PAP SMEAR: HM Pap smear: NORMAL

## 2014-02-24 ENCOUNTER — Other Ambulatory Visit (INDEPENDENT_AMBULATORY_CARE_PROVIDER_SITE_OTHER): Payer: 59

## 2014-02-24 ENCOUNTER — Ambulatory Visit: Payer: 59 | Admitting: Physician Assistant

## 2014-02-24 ENCOUNTER — Encounter: Payer: Self-pay | Admitting: Internal Medicine

## 2014-02-24 ENCOUNTER — Ambulatory Visit (INDEPENDENT_AMBULATORY_CARE_PROVIDER_SITE_OTHER): Payer: 59 | Admitting: Internal Medicine

## 2014-02-24 VITALS — BP 138/82 | HR 81 | Temp 98.5°F | Resp 16 | Ht 65.5 in | Wt 321.0 lb

## 2014-02-24 DIAGNOSIS — I10 Essential (primary) hypertension: Secondary | ICD-10-CM

## 2014-02-24 DIAGNOSIS — R404 Transient alteration of awareness: Secondary | ICD-10-CM

## 2014-02-24 DIAGNOSIS — A5901 Trichomonal vulvovaginitis: Secondary | ICD-10-CM | POA: Insufficient documentation

## 2014-02-24 DIAGNOSIS — J309 Allergic rhinitis, unspecified: Secondary | ICD-10-CM

## 2014-02-24 DIAGNOSIS — Z0000 Encounter for general adult medical examination without abnormal findings: Secondary | ICD-10-CM | POA: Insufficient documentation

## 2014-02-24 DIAGNOSIS — G4710 Hypersomnia, unspecified: Secondary | ICD-10-CM

## 2014-02-24 DIAGNOSIS — G4730 Sleep apnea, unspecified: Principal | ICD-10-CM

## 2014-02-24 LAB — COMPREHENSIVE METABOLIC PANEL
ALK PHOS: 57 U/L (ref 39–117)
ALT: 23 U/L (ref 0–35)
AST: 19 U/L (ref 0–37)
Albumin: 3.6 g/dL (ref 3.5–5.2)
BILIRUBIN TOTAL: 0.3 mg/dL (ref 0.2–1.2)
BUN: 13 mg/dL (ref 6–23)
CALCIUM: 8.8 mg/dL (ref 8.4–10.5)
CHLORIDE: 103 meq/L (ref 96–112)
CO2: 29 mEq/L (ref 19–32)
CREATININE: 0.9 mg/dL (ref 0.4–1.2)
GFR: 89.63 mL/min (ref >60.00)
Glucose, Bld: 85 mg/dL (ref 70–99)
Potassium: 3.8 mEq/L (ref 3.5–5.1)
Sodium: 138 mEq/L (ref 135–145)
Total Protein: 6.7 g/dL (ref 6.0–8.3)

## 2014-02-24 LAB — CBC WITH DIFFERENTIAL/PLATELET
BASOS PCT: 0.4 % (ref 0.0–3.0)
Basophils Absolute: 0 10*3/uL (ref 0.0–0.1)
Eosinophils Absolute: 0.2 10*3/uL (ref 0.0–0.7)
Eosinophils Relative: 1.9 % (ref 0.0–5.0)
HEMATOCRIT: 38.5 % (ref 36.0–46.0)
Hemoglobin: 12.3 g/dL (ref 12.0–15.0)
LYMPHS ABS: 2.8 10*3/uL (ref 0.7–4.0)
LYMPHS PCT: 31.7 % (ref 12.0–46.0)
MCHC: 31.9 g/dL (ref 30.0–36.0)
MCV: 76.2 fl — ABNORMAL LOW (ref 78.0–100.0)
Monocytes Absolute: 0.6 10*3/uL (ref 0.1–1.0)
Monocytes Relative: 7 % (ref 3.0–12.0)
NEUTROS ABS: 5.2 10*3/uL (ref 1.4–7.7)
Neutrophils Relative %: 59 % (ref 43.0–77.0)
Platelets: 374 10*3/uL (ref 150.0–400.0)
RBC: 5.06 Mil/uL (ref 3.87–5.11)
RDW: 15.3 % (ref 11.5–15.5)
WBC: 8.9 10*3/uL (ref 4.0–10.5)

## 2014-02-24 LAB — URINALYSIS, ROUTINE W REFLEX MICROSCOPIC
BILIRUBIN URINE: NEGATIVE
Ketones, ur: NEGATIVE
NITRITE: NEGATIVE
Specific Gravity, Urine: 1.015 (ref 1.000–1.030)
Urine Glucose: NEGATIVE
Urobilinogen, UA: 0.2 (ref 0.0–1.0)
pH: 7.5 (ref 5.0–8.0)

## 2014-02-24 LAB — LIPID PANEL
Cholesterol: 127 mg/dL (ref 0–200)
HDL: 40.6 mg/dL (ref >39.00)
LDL CALC: 66 mg/dL (ref 0–99)
TRIGLYCERIDES: 100 mg/dL (ref 0.0–149.0)
Total CHOL/HDL Ratio: 3
VLDL: 20 mg/dL (ref 0.0–40.0)

## 2014-02-24 LAB — HCG, QUANTITATIVE, PREGNANCY: hCG, Beta Chain, Quant, S: 0.67 m[IU]/mL

## 2014-02-24 LAB — TSH: TSH: 1.83 u[IU]/mL (ref 0.35–4.50)

## 2014-02-24 LAB — HEMOGLOBIN A1C: Hgb A1c MFr Bld: 6.1 % (ref 4.6–6.5)

## 2014-02-24 MED ORDER — METRONIDAZOLE 500 MG PO TABS
500.0000 mg | ORAL_TABLET | Freq: Three times a day (TID) | ORAL | Status: AC
Start: 2014-02-24 — End: 2014-03-06

## 2014-02-24 MED ORDER — NALTREXONE-BUPROPION HCL ER 8-90 MG PO TB12: 2.0000 | ORAL_TABLET | Freq: Two times a day (BID) | ORAL | Status: DC

## 2014-02-24 NOTE — Assessment & Plan Note (Signed)
I think she has a lot of s/s related to this so I have asked her to f/up with sleep medicine

## 2014-02-24 NOTE — Assessment & Plan Note (Signed)
F/up with sleep med

## 2014-02-24 NOTE — Progress Notes (Signed)
Subjective:    Patient ID: Alicia Hoover, female    DOB: 10-26-1975, 38 y.o.   MRN: [id number]  Hypertension This is a chronic problem. The current episode started more than 1 year ago. The problem has been gradually improving since onset. The problem is controlled. Associated symptoms include headaches (she has headaches in the AM that resolve during the day) and malaise/fatigue. Pertinent negatives include no anxiety, blurred vision, chest pain, neck pain, orthopnea, palpitations, peripheral edema, PND, shortness of breath or sweats. Past treatments include ACE inhibitors and diuretics. The current treatment provides moderate improvement. Compliance problems include diet and exercise.  Identifiable causes of hypertension include sleep apnea.      Review of Systems  Constitutional: Positive for malaise/fatigue, fatigue and unexpected weight change (wt gain). Negative for fever, chills, diaphoresis, activity change and appetite change.  HENT: Positive for congestion, postnasal drip, rhinorrhea and sneezing. Negative for ear pain, nosebleeds, sinus pressure, sore throat, tinnitus, trouble swallowing and voice change.   Eyes: Negative.  Negative for blurred vision.  Respiratory: Positive for apnea. Negative for cough, choking, chest tightness, shortness of breath, wheezing and stridor.   Cardiovascular: Negative.  Negative for chest pain, palpitations, orthopnea, leg swelling and PND.  Gastrointestinal: Negative.  Negative for nausea, vomiting, abdominal pain, constipation and blood in stool.  Endocrine: Negative.   Genitourinary: Negative.  Negative for difficulty urinating.  Musculoskeletal: Negative.  Negative for neck pain.  Skin: Negative.   Allergic/Immunologic: Negative.   Neurological: Positive for headaches (she has headaches in the AM that resolve during the day). Negative for dizziness, tremors, seizures, syncope, facial asymmetry, speech difficulty, weakness, light-headedness and  numbness.  Hematological: Negative.  Negative for adenopathy. Does not bruise/bleed easily.  Psychiatric/Behavioral: Negative.        Objective:   Physical Exam  Vitals reviewed. Constitutional: She is oriented to person, place, and time. She appears well-developed and well-nourished. No distress.  HENT:  Head: Normocephalic and atraumatic.  Right Ear: Hearing, tympanic membrane, external ear and ear canal normal.  Left Ear: Hearing, tympanic membrane, external ear and ear canal normal.  Nose: Mucosal edema present. No rhinorrhea, nose lacerations, sinus tenderness, nasal deformity, septal deviation or nasal septal hematoma. No epistaxis.  No foreign bodies. Right sinus exhibits no maxillary sinus tenderness and no frontal sinus tenderness. Left sinus exhibits no maxillary sinus tenderness and no frontal sinus tenderness.  Mouth/Throat: Oropharynx is clear and moist and mucous membranes are normal. Mucous membranes are not pale, not dry and not cyanotic. No oral lesions. No trismus in the jaw. No uvula swelling. No oropharyngeal exudate, posterior oropharyngeal edema, posterior oropharyngeal erythema or tonsillar abscesses.  Eyes: Conjunctivae are normal. Right eye exhibits no discharge. Left eye exhibits no discharge. No scleral icterus.  Neck: Normal range of motion. Neck supple. No JVD present. No tracheal deviation present. No thyromegaly present.  Cardiovascular: Normal rate, regular rhythm, normal heart sounds and intact distal pulses.  Exam reveals no gallop and no friction rub.   No murmur heard. Pulmonary/Chest: Effort normal and breath sounds normal. No stridor. No respiratory distress. She has no wheezes. She has no rales. She exhibits no tenderness.  Abdominal: Soft. Bowel sounds are normal. She exhibits no distension and no mass. There is no tenderness. There is no rebound and no guarding.  Musculoskeletal: Normal range of motion. She exhibits no edema and no tenderness.    Lymphadenopathy:    She has no cervical adenopathy.  Neurological: She is alert and oriented to  person, place, and time. She has normal reflexes. She displays normal reflexes. No cranial nerve deficit. She exhibits normal muscle tone. Coordination normal.  Skin: Skin is warm and dry. No rash noted. She is not diaphoretic. No erythema. No pallor.  Psychiatric: She has a normal mood and affect. Her behavior is normal. Judgment and thought content normal.     No results found for this basename: WBC, HGB, HCT, PLT, GLUCOSE, CHOL, TRIG, HDL, LDLDIRECT, LDLCALC, ALT, AST, NA, K, CL, CREATININE, BUN, CO2, TSH, PSA, INR, GLUF, HGBA1C, MICROALBUR       Assessment & Plan:

## 2014-02-24 NOTE — Assessment & Plan Note (Signed)
Her BP is well controlled I will monitor her lytes and renal function today 

## 2014-02-24 NOTE — Assessment & Plan Note (Signed)
She is ready to lose weight so I have asked her to try contrave and to cont working on her lifestyle modifications

## 2014-02-24 NOTE — Progress Notes (Signed)
Pre visit review using our clinic review tool, if applicable. No additional management support is needed unless otherwise documented below in the visit note. 

## 2014-02-24 NOTE — Assessment & Plan Note (Signed)
Exam done Vaccines were documented Labs ordered Pt ed material was given

## 2014-02-24 NOTE — Addendum Note (Signed)
Addended by: [name] on: 02/24/2014 02:00 PM   Modules accepted: Orders

## 2014-02-24 NOTE — Patient Instructions (Signed)
Preventive Care for Adults, Female A healthy lifestyle and preventive care can promote health and wellness. Preventive health guidelines for women include the following key practices.  A routine yearly physical is a good way to check with your health care provider about your health and preventive screening. It is a chance to share any concerns and updates on your health and to receive a thorough exam.  Visit your dentist for a routine exam and preventive care every 6 months. Brush your teeth twice a day and floss once a day. Good oral hygiene prevents tooth decay and gum disease.  The frequency of eye exams is based on your age, health, family medical history, use of contact lenses, and other factors. Follow your health care provider's recommendations for frequency of eye exams.  Eat a healthy diet. Foods like vegetables, fruits, whole grains, low-fat dairy products, and lean protein foods contain the nutrients you need without too many calories. Decrease your intake of foods high in solid fats, added sugars, and salt. Eat the right amount of calories for you.Get information about a proper diet from your health care provider, if necessary.  Regular physical exercise is one of the most important things you can do for your health. Most adults should get at least 150 minutes of moderate-intensity exercise (any activity that increases your heart rate and causes you to sweat) each week. In addition, most adults need muscle-strengthening exercises on 2 or more days a week.  Maintain a healthy weight. The body mass index (BMI) is a screening tool to identify possible weight problems. It provides an estimate of body fat based on height and weight. Your health care provider can find your BMI, and can help you achieve or maintain a healthy weight.For adults 20 years and older:  A BMI below 18.5 is considered underweight.  A BMI of 18.5 to 24.9 is normal.  A BMI of 25 to 29.9 is considered overweight.  A  BMI of 30 and above is considered obese.  Maintain normal blood lipids and cholesterol levels by exercising and minimizing your intake of saturated fat. Eat a balanced diet with plenty of fruit and vegetables. Blood tests for lipids and cholesterol should begin at age 62 and be repeated every 5 years. If your lipid or cholesterol levels are high, you are over 50, or you are at high risk for heart disease, you may need your cholesterol levels checked more frequently.Ongoing high lipid and cholesterol levels should be treated with medicines if diet and exercise are not working.  If you smoke, find out from your health care provider how to quit. If you do not use tobacco, do not start.  Lung cancer screening is recommended for adults aged 36 80 years who are at high risk for developing lung cancer because of a history of smoking. A yearly low-dose CT scan of the lungs is recommended for people who have at least a 30-pack-year history of smoking and are a current smoker or have quit within the past 15 years. A pack year of smoking is smoking an average of 1 pack of cigarettes a day for 1 year (for example: 1 pack a day for 30 years or 2 packs a day for 15 years). Yearly screening should continue until the smoker has stopped smoking for at least 15 years. Yearly screening should be stopped for people who develop a health problem that would prevent them from having lung cancer treatment.  If you are pregnant, do not drink alcohol. If you  are breastfeeding, be very cautious about drinking alcohol. If you are not pregnant and choose to drink alcohol, do not have more than 1 drink per day. One drink is considered to be 12 ounces (355 mL) of beer, 5 ounces (148 mL) of wine, or 1.5 ounces (44 mL) of liquor.  Avoid use of street drugs. Do not share needles with anyone. Ask for help if you need support or instructions about stopping the use of drugs.  High blood pressure causes heart disease and increases the risk  of stroke. Your blood pressure should be checked at least every 1 to 2 years. Ongoing high blood pressure should be treated with medicines if weight loss and exercise do not work.  If you are 39 39 years old, ask your health care provider if you should take aspirin to prevent strokes.  Diabetes screening involves taking a blood sample to check your fasting blood sugar level. This should be done once every 3 years, after age 56, if you are within normal weight and without risk factors for diabetes. Testing should be considered at a younger age or be carried out more frequently if you are overweight and have at least 1 risk factor for diabetes.  Breast cancer screening is essential preventive care for women. You should practice "breast self-awareness." This means understanding the normal appearance and feel of your breasts and may include breast self-examination. Any changes detected, no matter how small, should be reported to a health care provider. Women in their 40s and 30s should have a clinical breast exam (CBE) by a health care provider as part of a regular health exam every 1 to 3 years. After age 28, women should have a CBE every year. Starting at age 72, women should consider having a mammogram (breast X-ray test) every year. Women who have a family history of breast cancer should talk to their health care provider about genetic screening. Women at a high risk of breast cancer should talk to their health care providers about having an MRI and a mammogram every year.  Breast cancer gene (BRCA)-related cancer risk assessment is recommended for women who have family members with BRCA-related cancers. BRCA-related cancers include breast, ovarian, tubal, and peritoneal cancers. Having family members with these cancers may be associated with an increased risk for harmful changes (mutations) in the breast cancer genes BRCA1 and BRCA2. Results of the assessment will determine the need for genetic counseling  and BRCA1 and BRCA2 testing.  The Pap test is a screening test for cervical cancer. A Pap test can show cell changes on the cervix that might become cervical cancer if left untreated. A Pap test is a procedure in which cells are obtained and examined from the lower end of the uterus (cervix).  Women should have a Pap test starting at age 59.  Between ages 42 and 13, Pap tests should be repeated every 2 years.  Beginning at age 53, you should have a Pap test every 3 years as long as the past 3 Pap tests have been normal.  Some women have medical problems that increase the chance of getting cervical cancer. Talk to your health care provider about these problems. It is especially important to talk to your health care provider if a new problem develops soon after your last Pap test. In these cases, your health care provider may recommend more frequent screening and Pap tests.  The above recommendations are the same for women who have or have not gotten the vaccine  for human papillomavirus (HPV).  If you had a hysterectomy for a problem that was not cancer or a condition that could lead to cancer, then you no longer need Pap tests. Even if you no longer need a Pap test, a regular exam is a good idea to make sure no other problems are starting.  If you are between ages 58 and 10 years, and you have had normal Pap tests going back 10 years, you no longer need Pap tests. Even if you no longer need a Pap test, a regular exam is a good idea to make sure no other problems are starting.  If you have had past treatment for cervical cancer or a condition that could lead to cancer, you need Pap tests and screening for cancer for at least 20 years after your treatment.  If Pap tests have been discontinued, risk factors (such as a new sexual partner) need to be reassessed to determine if screening should be resumed.  The HPV test is an additional test that may be used for cervical cancer screening. The HPV test  looks for the virus that can cause the cell changes on the cervix. The cells collected during the Pap test can be tested for HPV. The HPV test could be used to screen women aged 67 years and older, and should be used in women of any age who have unclear Pap test results. After the age of 65, women should have HPV testing at the same frequency as a Pap test.  Colorectal cancer can be detected and often prevented. Most routine colorectal cancer screening begins at the age of 25 years and continues through age 66 years. However, your health care provider may recommend screening at an earlier age if you have risk factors for colon cancer. On a yearly basis, your health care provider may provide home test kits to check for hidden blood in the stool. Use of a small camera at the end of a tube, to directly examine the colon (sigmoidoscopy or colonoscopy), can detect the earliest forms of colorectal cancer. Talk to your health care provider about this at age 79, when routine screening begins. Direct exam of the colon should be repeated every 5 10 years through age 47 years, unless early forms of pre-cancerous polyps or small growths are found.  People who are at an increased risk for hepatitis B should be screened for this virus. You are considered at high risk for hepatitis B if:  You were born in a country where hepatitis B occurs often. Talk with your health care provider about which countries are considered high risk.  Your parents were born in a high-risk country and you have not received a shot to protect against hepatitis B (hepatitis B vaccine).  You have HIV or AIDS.  You use needles to inject street drugs.  You live with, or have sex with, someone who has Hepatitis B.  You get hemodialysis treatment.  You take certain medicines for conditions like cancer, organ transplantation, and autoimmune conditions.  Hepatitis C blood testing is recommended for all people born from [birth year] through 1965 and  any individual with known risks for hepatitis C.  Practice safe sex. Use condoms and avoid high-risk sexual practices to reduce the spread of sexually transmitted infections (STIs). STIs include gonorrhea, chlamydia, syphilis, trichomonas, herpes, HPV, and human immunodeficiency virus (HIV). Herpes, HIV, and HPV are viral illnesses that have no cure. They can result in disability, cancer, and death. Sexually active women aged 66  years and younger should be checked for chlamydia. Older women with new or multiple partners should also be tested for chlamydia. Testing for other STIs is recommended if you are sexually active and at increased risk.  Osteoporosis is a disease in which the bones lose minerals and strength with aging. This can result in serious bone fractures or breaks. The risk of osteoporosis can be identified using a bone density scan. Women ages 18 years and over and women at risk for fractures or osteoporosis should discuss screening with their health care providers. Ask your health care provider whether you should take a calcium supplement or vitamin D to reduce the rate of osteoporosis.  Menopause can be associated with physical symptoms and risks. Hormone replacement therapy is available to decrease symptoms and risks. You should talk to your health care provider about whether hormone replacement therapy is right for you.  Use sunscreen. Apply sunscreen liberally and repeatedly throughout the day. You should seek shade when your shadow is shorter than you. Protect yourself by wearing long sleeves, pants, a wide-brimmed hat, and sunglasses year round, whenever you are outdoors.  Once a month, do a whole body skin exam, using a mirror to look at the skin on your back. Tell your health care provider of new moles, moles that have irregular borders, moles that are larger than a pencil eraser, or moles that have changed in shape or color.  Stay current with required vaccines  (immunizations).  Influenza vaccine. All adults should be immunized every year.  Tetanus, diphtheria, and acellular pertussis (Td, Tdap) vaccine. Pregnant women should receive 1 dose of Tdap vaccine during each pregnancy. The dose should be obtained regardless of the length of time since the last dose. Immunization is preferred during the 27th 36th week of gestation. An adult who has not previously received Tdap or who does not know her vaccine status should receive 1 dose of Tdap. This initial dose should be followed by tetanus and diphtheria toxoids (Td) booster doses every 10 years. Adults with an unknown or incomplete history of completing a 3-dose immunization series with Td-containing vaccines should begin or complete a primary immunization series including a Tdap dose. Adults should receive a Td booster every 10 years.  Varicella vaccine. An adult without evidence of immunity to varicella should receive 2 doses or a second dose if she has previously received 1 dose. Pregnant females who do not have evidence of immunity should receive the first dose after pregnancy. This first dose should be obtained before leaving the health care facility. The second dose should be obtained 4 8 weeks after the first dose.  Human papillomavirus (HPV) vaccine. Females aged 9 26 years who have not received the vaccine previously should obtain the 3-dose series. The vaccine is not recommended for use in pregnant females. However, pregnancy testing is not needed before receiving a dose. If a female is found to be pregnant after receiving a dose, no treatment is needed. In that case, the remaining doses should be delayed until after the pregnancy. Immunization is recommended for any person with an immunocompromised condition through the age of 51 years if she did not get any or all doses earlier. During the 3-dose series, the second dose should be obtained 4 8 weeks after the first dose. The third dose should be obtained  24 weeks after the first dose and 16 weeks after the second dose.  Zoster vaccine. One dose is recommended for adults aged 57 years or older unless certain  conditions are present.  Measles, mumps, and rubella (MMR) vaccine. Adults born before [birth year] generally are considered immune to measles and mumps. Adults born in [birth year] or later should have 1 or more doses of MMR vaccine unless there is a contraindication to the vaccine or there is laboratory evidence of immunity to each of the three diseases. A routine second dose of MMR vaccine should be obtained at least 28 days after the first dose for students attending postsecondary schools, health care workers, or international travelers. People who received inactivated measles vaccine or an unknown type of measles vaccine during 1963 1967 should receive 2 doses of MMR vaccine. People who received inactivated mumps vaccine or an unknown type of mumps vaccine before 1979 and are at high risk for mumps infection should consider immunization with 2 doses of MMR vaccine. For females of childbearing age, rubella immunity should be determined. If there is no evidence of immunity, females who are not pregnant should be vaccinated. If there is no evidence of immunity, females who are pregnant should delay immunization until after pregnancy. Unvaccinated health care workers born before [birth year] who lack laboratory evidence of measles, mumps, or rubella immunity or laboratory confirmation of disease should consider measles and mumps immunization with 2 doses of MMR vaccine or rubella immunization with 1 dose of MMR vaccine.  Pneumococcal 13-valent conjugate (PCV13) vaccine. When indicated, a person who is uncertain of her immunization history and has no record of immunization should receive the PCV13 vaccine. An adult aged 42 years or older who has certain medical conditions and has not been previously immunized should receive 1 dose of PCV13 vaccine. This PCV13 should be followed  with a dose of pneumococcal polysaccharide (PPSV23) vaccine. The PPSV23 vaccine dose should be obtained at least 8 weeks after the dose of PCV13 vaccine. An adult aged 4 years or older who has certain medical conditions and previously received 1 or more doses of PPSV23 vaccine should receive 1 dose of PCV13. The PCV13 vaccine dose should be obtained 1 or more years after the last PPSV23 vaccine dose.  Pneumococcal polysaccharide (PPSV23) vaccine. When PCV13 is also indicated, PCV13 should be obtained first. All adults aged 27 years and older should be immunized. An adult younger than age 33 years who has certain medical conditions should be immunized. Any person who resides in a nursing home or long-term care facility should be immunized. An adult smoker should be immunized. People with an immunocompromised condition and certain other conditions should receive both PCV13 and PPSV23 vaccines. People with human immunodeficiency virus (HIV) infection should be immunized as soon as possible after diagnosis. Immunization during chemotherapy or radiation therapy should be avoided. Routine use of PPSV23 vaccine is not recommended for American Indians, [place] Natives, or people younger than 65 years unless there are medical conditions that require PPSV23 vaccine. When indicated, people who have unknown immunization and have no record of immunization should receive PPSV23 vaccine. One-time revaccination 5 years after the first dose of PPSV23 is recommended for people aged 13 64 years who have chronic kidney failure, nephrotic syndrome, asplenia, or immunocompromised conditions. People who received 1 2 doses of PPSV23 before age 66 years should receive another dose of PPSV23 vaccine at age 27 years or later if at least 5 years have passed since the previous dose. Doses of PPSV23 are not needed for people immunized with PPSV23 at or after age 33 years.  Meningococcal vaccine. Adults with asplenia or persistent complement  component deficiencies should receive 2  doses of quadrivalent meningococcal conjugate (MenACWY-D) vaccine. The doses should be obtained at least 2 months apart. Microbiologists working with certain meningococcal bacteria, [place] recruits, people at risk during an outbreak, and people who travel to or live in countries with a high rate of meningitis should be immunized. A first-year college student up through age 49 years who is living in a residence hall should receive a dose if she did not receive a dose on or after her 16th birthday. Adults who have certain high-risk conditions should receive one or more doses of vaccine.  Hepatitis A vaccine. Adults who wish to be protected from this disease, have certain high-risk conditions, work with hepatitis A-infected animals, work in hepatitis A research labs, or travel to or work in countries with a high rate of hepatitis A should be immunized. Adults who were previously unvaccinated and who anticipate close contact with an international adoptee during the first 60 days after arrival in the [place] from a country with a high rate of hepatitis A should be immunized.  Hepatitis B vaccine. Adults who wish to be protected from this disease, have certain high-risk conditions, may be exposed to blood or other infectious body fluids, are household contacts or sex partners of hepatitis B positive people, are clients or workers in certain care facilities, or travel to or work in countries with a high rate of hepatitis B should be immunized.  Haemophilus influenzae type b (Hib) vaccine. A previously unvaccinated person with asplenia or sickle cell disease or having a scheduled splenectomy should receive 1 dose of Hib vaccine. Regardless of previous immunization, a recipient of a hematopoietic stem cell transplant should receive a 3-dose series 6 12 months after her successful transplant. Hib vaccine is not recommended for adults with HIV infection. Preventive  Services / Frequency Ages 24 to 39years  Blood pressure check.** / Every 1 to 2 years.  Lipid and cholesterol check.** / Every 5 years beginning at age 66.  Clinical breast exam.** / Every 3 years for women in their 12s and 24s.  BRCA-related cancer risk assessment.** / For women who have family members with a BRCA-related cancer (breast, ovarian, tubal, or peritoneal cancers).  Pap test.** / Every 2 years from ages 31 through 69. Every 3 years starting at age 64 through age 76 or 89 with a history of 3 consecutive normal Pap tests.  HPV screening.** / Every 3 years from ages 10 through ages 10 to 96 with a history of 3 consecutive normal Pap tests.  Hepatitis C blood test.** / For any individual with known risks for hepatitis C.  Skin self-exam. / Monthly.  Influenza vaccine. / Every year.  Tetanus, diphtheria, and acellular pertussis (Tdap, Td) vaccine.** / Consult your health care provider. Pregnant women should receive 1 dose of Tdap vaccine during each pregnancy. 1 dose of Td every 10 years.  Varicella vaccine.** / Consult your health care provider. Pregnant females who do not have evidence of immunity should receive the first dose after pregnancy.  HPV vaccine. / 3 doses over 6 months, if 90 and younger. The vaccine is not recommended for use in pregnant females. However, pregnancy testing is not needed before receiving a dose.  Measles, mumps, rubella (MMR) vaccine.** / You need at least 1 dose of MMR if you were born in 1957 or later. You may also need a 2nd dose. For females of childbearing age, rubella immunity should be determined. If there is no evidence of immunity, females who are not  pregnant should be vaccinated. If there is no evidence of immunity, females who are pregnant should delay immunization until after pregnancy.  Pneumococcal 13-valent conjugate (PCV13) vaccine.** / Consult your health care provider.  Pneumococcal polysaccharide (PPSV23) vaccine.** / 1 to 2  doses if you smoke cigarettes or if you have certain conditions.  Meningococcal vaccine.** / 1 dose if you are age 88 to 6 years and a Market researcher living in a residence hall, or have one of several medical conditions, you need to get vaccinated against meningococcal disease. You may also need additional booster doses.  Hepatitis A vaccine.** / Consult your health care provider.  Hepatitis B vaccine.** / Consult your health care provider.  Haemophilus influenzae type b (Hib) vaccine.** / Consult your health care provider. Ages 23 to 64years  Blood pressure check.** / Every 1 to 2 years.  Lipid and cholesterol check.** / Every 5 years beginning at age 20 years.  Lung cancer screening. / Every year if you are aged 51 80 years and have a 30-pack-year history of smoking and currently smoke or have quit within the past 15 years. Yearly screening is stopped once you have quit smoking for at least 15 years or develop a health problem that would prevent you from having lung cancer treatment.  Clinical breast exam.** / Every year after age 8 years.  BRCA-related cancer risk assessment.** / For women who have family members with a BRCA-related cancer (breast, ovarian, tubal, or peritoneal cancers).  Mammogram.** / Every year beginning at age 10 years and continuing for as long as you are in good health. Consult with your health care provider.  Pap test.** / Every 3 years starting at age 30 years through age 5 or 61 years with a history of 3 consecutive normal Pap tests.  HPV screening.** / Every 3 years from ages 39 years through ages 72 to 19 years with a history of 3 consecutive normal Pap tests.  Fecal occult blood test (FOBT) of stool. / Every year beginning at age 59 years and continuing until age 27 years. You may not need to do this test if you get a colonoscopy every 10 years.  Flexible sigmoidoscopy or colonoscopy.** / Every 5 years for a flexible sigmoidoscopy or every  10 years for a colonoscopy beginning at age 110 years and continuing until age 63 years.  Hepatitis C blood test.** / For all people born from [birth year] through 1966 and any individual with known risks for hepatitis C.  Skin self-exam. / Monthly.  Influenza vaccine. / Every year.  Tetanus, diphtheria, and acellular pertussis (Tdap/Td) vaccine.** / Consult your health care provider. Pregnant women should receive 1 dose of Tdap vaccine during each pregnancy. 1 dose of Td every 10 years.  Varicella vaccine.** / Consult your health care provider. Pregnant females who do not have evidence of immunity should receive the first dose after pregnancy.  Zoster vaccine.** / 1 dose for adults aged 46 years or older.  Measles, mumps, rubella (MMR) vaccine.** / You need at least 1 dose of MMR if you were born in 1958 or later. You may also need a 2nd dose. For females of childbearing age, rubella immunity should be determined. If there is no evidence of immunity, females who are not pregnant should be vaccinated. If there is no evidence of immunity, females who are pregnant should delay immunization until after pregnancy.  Pneumococcal 13-valent conjugate (PCV13) vaccine.** / Consult your health care provider.  Pneumococcal polysaccharide (PPSV23) vaccine.** / 1  to 2 doses if you smoke cigarettes or if you have certain conditions.  Meningococcal vaccine.** / Consult your health care provider.  Hepatitis A vaccine.** / Consult your health care provider.  Hepatitis B vaccine.** / Consult your health care provider.  Haemophilus influenzae type b (Hib) vaccine.** / Consult your health care provider. Ages 57 years and over  Blood pressure check.** / Every 1 to 2 years.  Lipid and cholesterol check.** / Every 5 years beginning at age 53 years.  Lung cancer screening. / Every year if you are aged 55 80 years and have a 30-pack-year history of smoking and currently smoke or have quit within the past 15 years.  Yearly screening is stopped once you have quit smoking for at least 15 years or develop a health problem that would prevent you from having lung cancer treatment.  Clinical breast exam.** / Every year after age 35 years.  BRCA-related cancer risk assessment.** / For women who have family members with a BRCA-related cancer (breast, ovarian, tubal, or peritoneal cancers).  Mammogram.** / Every year beginning at age 26 years and continuing for as long as you are in good health. Consult with your health care provider.  Pap test.** / Every 3 years starting at age 62 years through age 80 or 74 years with 3 consecutive normal Pap tests. Testing can be stopped between 65 and 70 years with 3 consecutive normal Pap tests and no abnormal Pap or HPV tests in the past 10 years.  HPV screening.** / Every 3 years from ages 64 years through ages 79 or 110 years with a history of 3 consecutive normal Pap tests. Testing can be stopped between 65 and 70 years with 3 consecutive normal Pap tests and no abnormal Pap or HPV tests in the past 10 years.  Fecal occult blood test (FOBT) of stool. / Every year beginning at age 46 years and continuing until age 57 years. You may not need to do this test if you get a colonoscopy every 10 years.  Flexible sigmoidoscopy or colonoscopy.** / Every 5 years for a flexible sigmoidoscopy or every 10 years for a colonoscopy beginning at age 85 years and continuing until age 9 years.  Hepatitis C blood test.** / For all people born from [birth year] through 1966 and any individual with known risks for hepatitis C.  Osteoporosis screening.** / A one-time screening for women ages 75 years and over and women at risk for fractures or osteoporosis.  Skin self-exam. / Monthly.  Influenza vaccine. / Every year.  Tetanus, diphtheria, and acellular pertussis (Tdap/Td) vaccine.** / 1 dose of Td every 10 years.  Varicella vaccine.** / Consult your health care provider.  Zoster vaccine.** / 1  dose for adults aged 66 years or older.  Pneumococcal 13-valent conjugate (PCV13) vaccine.** / Consult your health care provider.  Pneumococcal polysaccharide (PPSV23) vaccine.** / 1 dose for all adults aged 51 years and older.  Meningococcal vaccine.** / Consult your health care provider.  Hepatitis A vaccine.** / Consult your health care provider.  Hepatitis B vaccine.** / Consult your health care provider.  Haemophilus influenzae type b (Hib) vaccine.** / Consult your health care provider. ** Family history and personal history of risk and conditions may change your health care provider's recommendations. Document Released: 11/20/2001 Document Revised: 07/15/2013 Document Reviewed: 02/19/2011 [facility] Patient Information 2014 [place], [place]. Hypertension As your heart beats, it forces blood through your arteries. This force is your blood pressure. If the pressure is too high, it is  called hypertension (HTN) or high blood pressure. HTN is dangerous because you may have it and not know it. High blood pressure may mean that your heart has to work harder to pump blood. Your arteries may be narrow or stiff. The extra work puts you at risk for heart disease, stroke, and other problems.  Blood pressure consists of two numbers, a higher number over a lower, 110/72, for example. It is stated as "110 over 72." The ideal is below 120 for the top number (systolic) and under 80 for the bottom (diastolic). Write down your blood pressure today. You should pay close attention to your blood pressure if you have certain conditions such as:  Heart failure.  Prior heart attack.  Diabetes  Chronic kidney disease.  Prior stroke.  Multiple risk factors for heart disease. To see if you have HTN, your blood pressure should be measured while you are seated with your arm held at the level of the heart. It should be measured at least twice. A one-time elevated blood pressure reading (especially in the  Emergency Department) does not mean that you need treatment. There may be conditions in which the blood pressure is different between your right and left arms. It is important to see your caregiver soon for a recheck. Most people have essential hypertension which means that there is not a specific cause. This type of high blood pressure may be lowered by changing lifestyle factors such as:  Stress.  Smoking.  Lack of exercise.  Excessive weight.  Drug/tobacco/alcohol use.  Eating less salt. Most people do not have symptoms from high blood pressure until it has caused damage to the body. Effective treatment can often prevent, delay or reduce that damage. TREATMENT  When a cause has been identified, treatment for high blood pressure is directed at the cause. There are a large number of medications to treat HTN. These fall into several categories, and your caregiver will help you select the medicines that are best for you. Medications may have side effects. You should review side effects with your caregiver. If your blood pressure stays high after you have made lifestyle changes or started on medicines,   Your medication(s) may need to be changed.  Other problems may need to be addressed.  Be certain you understand your prescriptions, and know how and when to take your medicine.  Be sure to follow up with your caregiver within the time frame advised (usually within two weeks) to have your blood pressure rechecked and to review your medications.  If you are taking more than one medicine to lower your blood pressure, make sure you know how and at what times they should be taken. Taking two medicines at the same time can result in blood pressure that is too low. SEEK IMMEDIATE MEDICAL CARE IF:  You develop a severe headache, blurred or changing vision, or confusion.  You have unusual weakness or numbness, or a faint feeling.  You have severe chest or abdominal pain, vomiting, or breathing  problems. MAKE SURE YOU:   Understand these instructions.  Will watch your condition.  Will get help right away if you are not doing well or get worse. Document Released: 09/24/2005 Document Revised: 12/17/2011 Document Reviewed: 05/14/2008 [facility] Patient Information 2014 [place].

## 2014-03-04 ENCOUNTER — Encounter: Payer: Self-pay | Admitting: Internal Medicine

## 2014-03-12 ENCOUNTER — Other Ambulatory Visit: Payer: Self-pay | Admitting: Internal Medicine

## 2014-03-12 MED ORDER — BENAZEPRIL-HYDROCHLOROTHIAZIDE 20-12.5 MG PO TABS: 1.0000 | ORAL_TABLET | Freq: Every day | ORAL | Status: DC

## 2014-03-24 ENCOUNTER — Encounter: Payer: 59 | Attending: Internal Medicine | Admitting: Dietician

## 2014-03-24 ENCOUNTER — Encounter: Payer: Self-pay | Admitting: Dietician

## 2014-03-24 DIAGNOSIS — Z713 Dietary counseling and surveillance: Secondary | ICD-10-CM | POA: Diagnosis not present

## 2014-03-24 NOTE — Progress Notes (Signed)
Medical Nutrition Therapy:  Appt start time: 0930 end time:  1030.  Assessment:  Primary concerns today: Obesity, Pre-DM. Pt has lost 5.5 lbs since appointment with Dr. Ronnald Ramp about 1 month ago. Pt also concerned about her 2 daughters, 3 and 39 years old, as both are overweight, and 39 year old is not very active.   Preferred Learning Style:   No preference indicated   Learning Readiness:   Change in progress  MEDICATIONS: see list. Pt reports recent appetite reduction secondary to Contrave.    DIETARY INTAKE: Recently portion control and cravings for high sugar or fat snack foods has decreased substantially due to contrave. Usual eating pattern includes 3 meals and 2-3 snacks per day. Everyday foods include fruit, egg white mcmuffin.  Avoided foods include candy, chips, sodas, sweet tea.    24-hr recall:  B ( AM): lately 1-2 egg white on english muffin with cheese from [employer] and blueberry pomegranate smoothie. Used to be bacon, egg, and cheese, bagel.   Snk ( AM): fruits or nothing. Used to choose mainly chips and candy bars, candy. L ( PM): chix alfredo with broccoli. Salads (lettuce, tomato, cucumbers, sometimes with steak and feta cheese). Used to be routinely [place]. Now smaller portions of chinese food with more focus on vegetable. water Snk ( PM): used to be candy, chips. Now fruit or nothing.  D ( PM): olive garden- salad and breadsticks only. Pizza, hamburgers, hot dogs, leftover fettucine alfredo. Steak salad. Water. Sometimes a cup or two of OJ.  Snk ( PM): ice cream single serving cup x 2. Most often canned fruit in juice.  Beverages: water, juice, smoothie, EtOH only about once per month, no kool aid, no lemonade, no sweet tea, no coffee  Usual physical activity: cleaning after kids and using steps some at home. Apartment complex has gym, but pt does not use regularly. Pt occupation is very sedentary, working at a call center. Pt also has a bone spur in ankle that limits  walking and many traditional exercises.  Progress Towards Goal(s):  In progress.   Nutritional Diagnosis:  [place]-3.3 Overweight/obesity As related to hx of high kcal food choices, very low physical activity.  As evidenced by BMI>30, pt reports of previous routine intake of large portions, high kcal snack foods, no exercise.    Intervention:  Nutrition counseling. Primary points of emphasis were choosing lower fat proteins, continuing to control portions of starches at meals, increasing portions of nonstarchy vegetables, best snack options, keeping protein intake high while on low kcal diet to optimize fat loss. RD demonstrated the Plate Method for best portion control, discussed alterations to restaurant choices, and emphasized goal of 4 oz. Protein food at each meal. RD provided protein powder options to keep protein intake high at meals when pt is not overly hungry. RD provided kcal Rx to be tracked on smartphone- 2050 kcal per day with minimum 125 g protein per day.   Teaching Method Utilized:  Visual Auditory  Handouts given during visit include:  Best pro, fat, and carb rich foods for health  Supplement recommendations  Barriers to learning/adherence to lifestyle change: hx of preferences for high sugar and high fat foods, minimal exercise hx  Demonstrated degree of understanding via:  Teach Back   Monitoring/Evaluation:  Dietary intake, exercise, portion control, and body weight in 4 month(s).

## 2014-04-05 ENCOUNTER — Encounter: Payer: Self-pay | Admitting: Internal Medicine

## 2014-04-05 ENCOUNTER — Ambulatory Visit (INDEPENDENT_AMBULATORY_CARE_PROVIDER_SITE_OTHER): Payer: 59 | Admitting: Internal Medicine

## 2014-04-05 VITALS — BP 128/70 | HR 90 | Ht 65.0 in | Wt 318.4 lb

## 2014-04-05 DIAGNOSIS — G4730 Sleep apnea, unspecified: Principal | ICD-10-CM

## 2014-04-05 DIAGNOSIS — G4710 Hypersomnia, unspecified: Secondary | ICD-10-CM

## 2014-04-05 DIAGNOSIS — G4733 Obstructive sleep apnea (adult) (pediatric): Secondary | ICD-10-CM

## 2014-04-05 NOTE — Assessment & Plan Note (Signed)
Current exam and history are strongly consistent with OSA,  Plan- Schedule split protocol sleep study. Reviewed basics of the test, medical concerns of untreated OSA, responsibility to drive safely

## 2014-04-05 NOTE — Patient Instructions (Signed)
Order- schedule NPSG  With split protocol    Dx OSA

## 2014-04-05 NOTE — Progress Notes (Signed)
04/05/14- 30 yoF former smoker Referred courtesy of Dr Karlyn Agee study in 2012(printed) Hx of daytime sleepiness, loud snoring, headaches. NPSG 2003 dx'd OSA and qualified for CPAP, which she stopped wearing. NPSG 10/17/2010- AHI 0.9/ hr, weight 290 lbs, did not qualify for CPAP. Children continue to comment on her snoring. She fights daytime sleepiness, especially after lunch, and at her sedentary job in Therapist, art. No cardiopulmonary disease except HBP. No ENT surgery. Caffeine causes headaches. Mother and son both wear PAP for OSA  Prior to Admission medications   Medication Sig Start Date End Date Taking? Authorizing Alicia Hoover  benazepril-hydrochlorthiazide (LOTENSIN HCT) 20-12.5 MG per tablet Take 1 tablet by mouth daily. 03/12/14  Yes [name], MD  cetirizine (ZYRTEC) 10 MG tablet Take 10 mg by mouth daily.   Yes Historical Kendry Pfarr, MD  fluticasone (FLONASE) 50 MCG/ACT nasal spray Place 2 sprays into the nose daily as needed for allergies.    Yes Historical Raymel Cull, MD  montelukast (SINGULAIR) 10 MG tablet Take 10 mg by mouth at bedtime.   Yes Historical Jandel Patriarca, MD  Multiple Vitamin (MULTIVITAMIN) capsule Take 1 capsule by mouth daily.   Yes Historical Vardaan Depascale, MD  Multiple Vitamins-Minerals (HAIR/SKIN/NAILS) TABS Take by mouth.   Yes Historical Shulamit Donofrio, MD  Naltrexone-Bupropion HCl ER (CONTRAVE) 8-90 MG TB12 Take 2 tablets by mouth 2 (two) times daily. 02/24/14  Yes [name], MD  norgestimate-ethinyl estradiol (C[place]ova 28) 0.25-35 MG-MCG per tablet Take 1 tablet by mouth daily.     Yes Historical Keshav Winegar, MD  omeprazole (PRILOSEC) 40 MG capsule Take 40 mg by mouth daily.   Yes Historical Kimika Streater, MD   Past Medical History  Diagnosis Date  . Asthma   . Rhinitis   . GERD (gastroesophageal reflux disease)   . Migraine   . Hypersomnia with sleep apnea   . Hypertension    Past Surgical History  Procedure Laterality Date  . Cholecystectomy    . Tooth  extraction     Family History  Problem Relation Age of Onset  . Diabetes Father   . Early death Father   . Diabetes Mother   . COPD Mother   . Rheum arthritis Mother   . Hyperlipidemia Brother   . Alcohol abuse Neg Hx   . Cancer Neg Hx   . Depression Neg Hx   . Drug abuse Neg Hx   . Hearing loss Neg Hx   . Heart disease Neg Hx   . Hypertension Neg Hx   . Stroke Neg Hx   . Thyroid disease Brother    History   Social History  . Marital Status: Widowed    Spouse Name: N/A    Number of Children: 3  . Years of Education: N/A   Occupational History  . [employer]   .     Social History Main Topics  . Smoking status: Former Smoker -- 0.10 packs/day for 1 years    Types: Cigarettes    Quit date: 10/09/1999  . Smokeless tobacco: Never Used  . Alcohol Use: Yes     Comment: 1 glass of white wine monthly  . Drug Use: No  . Sexual Activity: Yes    Birth Control/ Protection: Pill   Other Topics Concern  . Not on file   Social History Narrative  . No narrative on file   ROS-see HPI Constitutional:   No-   weight loss, night sweats, fevers, chills, +fatigue, lassitude. HEENT:   + headaches, No-difficulty swallowing, +  tooth/dental problems, sore throat,       + sneezing, No-itching, ear ache, +nasal congestion, post nasal drip,  CV:  No-   chest pain, orthopnea, PND, swelling in lower extremities, anasarca,                                  dizziness, palpitations Resp: + shortness of breath with exertion or at rest.              No-   productive cough,  No non-productive cough,  No- coughing up of blood.              No-   change in color of mucus.  No- wheezing.   Skin: No-   rash or lesions. GI:  +heartburn, indigestion, No-abdominal pain, nausea, vomiting, diarrhea,                 change in bowel habits, loss of appetite GU: No-   dysuria, change in color of urine, no urgency or frequency.  No- flank pain. MS:  No-   joint pain or swelling.  No- decreased range of  motion.  No- back pain. Neuro-     nothing unusual Psych:  No- change in mood or affect. + depression +anxiety.  No memory loss.  OBJ- Physical Exam General- Alert, Oriented, Affect-appropriate, Distress- none acute, +obese Skin- rash-none, lesions- none, excoriation- none Lymphadenopathy- none Head- atraumatic            Eyes- Gross vision intact, PERRLA, conjunctivae and secretions clear            Ears- Hearing, canals-normal            Nose- Clear, no-Septal dev, mucus, polyps, erosion, perforation             Throat- Mallampati IV , mucosa clear , drainage- none, tonsils- atrophic, own teeth Neck- flexible , trachea midline, no stridor , thyroid nl, carotid no bruit Chest - symmetrical excursion , unlabored           Heart/CV- RRR , no murmur , no gallop  , no rub, nl s1 s2                           - JVD- none , edema- none, stasis changes- none, varices- none           Lung- clear to P&A, wheeze- none, cough- none , dullness-none, rub- none           Chest wall-  Abd- tender-no, distended-no, bowel sounds-present, HSM- no Br/ Gen/ Rectal- Not done, not indicated Extrem- cyanosis- none, clubbing, none, atrophy- none, strength- nl Neuro- grossly intact to observation

## 2014-05-21 ENCOUNTER — Ambulatory Visit (HOSPITAL_BASED_OUTPATIENT_CLINIC_OR_DEPARTMENT_OTHER): Payer: 59 | Attending: Internal Medicine | Admitting: Radiology

## 2014-05-21 VITALS — Ht 65.0 in | Wt 313.0 lb

## 2014-05-21 DIAGNOSIS — G4730 Sleep apnea, unspecified: Secondary | ICD-10-CM | POA: Diagnosis present

## 2014-05-21 DIAGNOSIS — G4733 Obstructive sleep apnea (adult) (pediatric): Secondary | ICD-10-CM

## 2014-05-21 DIAGNOSIS — G4710 Hypersomnia, unspecified: Secondary | ICD-10-CM | POA: Diagnosis present

## 2014-05-22 DIAGNOSIS — G4733 Obstructive sleep apnea (adult) (pediatric): Secondary | ICD-10-CM

## 2014-05-22 NOTE — Sleep Study (Signed)
   NAME: Alicia Hoover DATE OF BIRTH:  Nov 28, 1975 MEDICAL RECORD NUMBER [id number]  LOCATION: [facility]  PHYSICIAN: YOUNG,CLINTON D  DATE OF STUDY: 05/21/2014  SLEEP STUDY TYPE: Nocturnal Polysomnogram               REFERRING PHYSICIAN: Baird Lyons D, MD  INDICATION FOR STUDY: Hypersomnia with sleep apnea  EPWORTH SLEEPINESS SCORE:   24/24 HEIGHT: 5' 5"  (165.1 cm)  WEIGHT: 313 lb (141.976 kg)    Body mass index is 52.09 kg/(m^2).  NECK SIZE: 15.5 in.  MEDICATIONS: Charted for review  SLEEP ARCHITECTURE: Total sleep time 317.5 minutes with sleep efficiency 88.3%. Stage I was 4.1%, stage II 77.3%, stage III absent, REM 18.6% of total sleep time. Sleep latency 20 minutes, REM latency 75 minutes, awake after sleep onset 22 minutes, arousal index 6.4, bedtime medication: None  RESPIRATORY DATA: Apnea hypopneas index (AHI) 9.4 per hour. 50 total events scored including 11 obstructive apneas, 7 central apneas, 32 hypopneas. Events were not positional. REM AHI 47.8 per hour. There were insufficient early events to meet protocol requirements for split CPAP titration.  OXYGEN DATA: Moderate snoring with oxygen desaturation to a nadir of 81% and mean saturation of 96.9% on room air  CARDIAC DATA: Sinus rhythm with occasional PVC and PAC  MOVEMENT/PARASOMNIA: No significant movement disturbance, no bathroom trips  IMPRESSION/ RECOMMENDATION:   1) Mild obstructive sleep apnea/hypopneas syndrome, AHI 9.4 per hour with non-positional events. REM AHI 47.8 per hour. Moderate snoring with oxygen desaturation to a nadir of 81% and mean saturation of 96.9% on room air. 2) The patient did not develop enough respiratory events in the first hours of the night to meet protocol requirements for CPAP titration on the study. She can return for dedicated CPAP titration study if appropriate. 3) A previous polysomnogram on 10/17/2010 record AHI 0.9 per hour with body weight 290  pounds.   Deneise Lever Diplomate, American Board of Sleep Medicine  ELECTRONICALLY SIGNED ON:  05/22/2014, 2:04 PM [place] PH: [phone]   FX: [phone] [place]

## 2014-05-31 ENCOUNTER — Encounter: Payer: Self-pay | Admitting: Internal Medicine

## 2014-05-31 ENCOUNTER — Ambulatory Visit (INDEPENDENT_AMBULATORY_CARE_PROVIDER_SITE_OTHER): Payer: 59 | Admitting: Internal Medicine

## 2014-05-31 VITALS — BP 148/82 | HR 85 | Ht 65.0 in | Wt 313.0 lb

## 2014-05-31 DIAGNOSIS — G4733 Obstructive sleep apnea (adult) (pediatric): Secondary | ICD-10-CM

## 2014-05-31 DIAGNOSIS — G4730 Sleep apnea, unspecified: Principal | ICD-10-CM

## 2014-05-31 DIAGNOSIS — G4710 Hypersomnia, unspecified: Secondary | ICD-10-CM

## 2014-05-31 NOTE — Assessment & Plan Note (Addendum)
Educated and willing to make an effort to work with CPAP. Weight loss emphasized. Plan- CPAP auto titration to start

## 2014-05-31 NOTE — Assessment & Plan Note (Signed)
She indicates she is working with diet and activity to lose weight.

## 2014-05-31 NOTE — Patient Instructions (Signed)
Order- New DME new CPAP autotitrate 5-20 cwp pending download for pressure compliance, mask of choice, humidifier, supplies     Dx OSA

## 2014-05-31 NOTE — Progress Notes (Signed)
04/05/14- 24 yoF former smoker Referred courtesy of Dr Karlyn Agee study in 2012(printed) Hx of daytime sleepiness, loud snoring, headaches. NPSG 2003 dx'd OSA and qualified for CPAP, which she stopped wearing. NPSG 10/17/2010- AHI 0.9/ hr, weight 290 lbs, did not qualify for CPAP. Children continue to comment on her snoring. She fights daytime sleepiness, especially after lunch, and at her sedentary job in Therapist, art. No cardiopulmonary disease except HBP. No ENT surgery. Caffeine causes headaches. Mother and son both wear PAP for OSA  05/31/14- 38 yoF former smoker followed for OSA complicated by obesity, HBP, allergic rhinitis Hx of daytime sleepiness, loud snoring, headaches. NPSG 05/21/14- Mild OSA, AHI 9.4/ hr, desat to 81%, weight 313 lbs She fights daytime sleepiness and is looking forward to getting machine. We discussed sleep hygiene, alternatives to CPAP, importance of weight, and driving responsibility. She is working on weight.  ROS-see HPI Constitutional:   No-   weight loss, night sweats, fevers, chills, +fatigue, lassitude. HEENT:   + headaches, No-difficulty swallowing, +tooth/dental problems, sore throat,       No- sneezing, No-itching, ear ache, +nasal congestion, post nasal drip,  CV:  No-   chest pain, orthopnea, PND, swelling in lower extremities, anasarca,                                  dizziness, palpitations Resp: + shortness of breath with exertion or at rest.              No-   productive cough,  No non-productive cough,  No- coughing up of blood.              No-   change in color of mucus.  No- wheezing.   Skin: No-   rash or lesions. GI:  +heartburn, indigestion, No-abdominal pain, nausea, vomiting, GU:  MS:  No-   joint pain or swelling.   Neuro-     nothing unusual Psych:  No- change in mood or affect. + depression +anxiety.  No memory loss.  OBJ- Physical Exam General- Alert, Oriented, Affect-appropriate, Distress- none acute, +obese, looks  tired Skin- rash-none, lesions- none, excoriation- none Lymphadenopathy- none Head- atraumatic            Eyes- Gross vision intact, PERRLA, conjunctivae and secretions clear            Ears- Hearing, canals-normal            Nose- Clear, no-Septal dev, mucus, polyps, erosion, perforation             Throat- Mallampati IV , mucosa clear , drainage- none, tonsils- atrophic, own teeth Neck- flexible , trachea midline, no stridor , thyroid nl, carotid no bruit Chest - symmetrical excursion , unlabored           Heart/CV- RRR , no murmur , no gallop  , no rub, nl s1 s2                           - JVD- none , edema- none, stasis changes- none, varices- none           Lung- clear to P&A, wheeze- none, cough- none , dullness-none, rub- none           Chest wall-  Abd-  Br/ Gen/ Rectal- Not done, not indicated Extrem- cyanosis- none, clubbing, none, atrophy- none, strength- nl Neuro- grossly intact to  observation

## 2014-06-07 ENCOUNTER — Encounter (HOSPITAL_COMMUNITY): Payer: Self-pay | Admitting: Emergency Medicine

## 2014-06-07 ENCOUNTER — Emergency Department (HOSPITAL_COMMUNITY)
Admission: EM | Admit: 2014-06-07 | Discharge: 2014-06-07 | Disposition: A | Discharge status: Home / Self Care | Payer: 59 | Attending: Emergency Medicine | Admitting: Emergency Medicine

## 2014-06-07 DIAGNOSIS — T733XXA Exhaustion due to excessive exertion, initial encounter: Secondary | ICD-10-CM | POA: Diagnosis not present

## 2014-06-07 DIAGNOSIS — Y9389 Activity, other specified: Secondary | ICD-10-CM | POA: Diagnosis not present

## 2014-06-07 DIAGNOSIS — IMO0002 Reserved for concepts with insufficient information to code with codable children: Secondary | ICD-10-CM | POA: Insufficient documentation

## 2014-06-07 DIAGNOSIS — X501XXA Overexertion from prolonged static or awkward postures, initial encounter: Secondary | ICD-10-CM | POA: Insufficient documentation

## 2014-06-07 DIAGNOSIS — K219 Gastro-esophageal reflux disease without esophagitis: Secondary | ICD-10-CM | POA: Insufficient documentation

## 2014-06-07 DIAGNOSIS — I10 Essential (primary) hypertension: Secondary | ICD-10-CM | POA: Insufficient documentation

## 2014-06-07 DIAGNOSIS — Z79899 Other long term (current) drug therapy: Secondary | ICD-10-CM | POA: Insufficient documentation

## 2014-06-07 DIAGNOSIS — Z87891 Personal history of nicotine dependence: Secondary | ICD-10-CM | POA: Diagnosis not present

## 2014-06-07 DIAGNOSIS — S76219A Strain of adductor muscle, fascia and tendon of unspecified thigh, initial encounter: Secondary | ICD-10-CM

## 2014-06-07 DIAGNOSIS — Y9289 Other specified places as the place of occurrence of the external cause: Secondary | ICD-10-CM | POA: Diagnosis not present

## 2014-06-07 DIAGNOSIS — J45909 Unspecified asthma, uncomplicated: Secondary | ICD-10-CM | POA: Insufficient documentation

## 2014-06-07 MED ORDER — METHOCARBAMOL 500 MG PO TABS: 1000.0000 mg | ORAL_TABLET | Freq: Four times a day (QID) | ORAL | Status: DC

## 2014-06-07 MED ORDER — NAPROXEN 500 MG PO TABS: 500.0000 mg | ORAL_TABLET | Freq: Two times a day (BID) | ORAL | Status: DC

## 2014-06-07 NOTE — ED Provider Notes (Signed)
Medical screening examination/treatment/procedure(s) were performed by non-physician practitioner and as supervising physician I was immediately available for consultation/collaboration.   EKG Interpretation None      Rolland Porter, MD, Abram Sander   Janice Norrie, MD 06/07/14 [phone]

## 2014-06-07 NOTE — ED Provider Notes (Signed)
CSN: [id number]     Arrival date & time 06/07/14  1505 History   First MD Initiated Contact with Patient 06/07/14 1545   This chart was scribed for non-physician practitioner Carlisle Cater, PA-C, working with Janice Norrie, MD by Rosary Lively, ED scribe. This patient was seen in room WTR8/WTR8 and the patient's care was started at 4:01 PM.    Chief Complaint  Patient presents with  . Groin Pain   The history is provided by the patient. No language interpreter was used.   HPI Comments:  Alicia Hoover is a 38 y.o. female who presents to the Emergency Department complaining of pain in the right groin region X 1 week. Pt reports that if she sits for a long time she feels pain, along with a pop, when she is walking. Pt reports that she cannot put a lot of pressure on leg due to the amount of pain. Pt reports that she has attempted to use a heat compress, without relief. Pt reports that she has to sleep in a certain position to prevent pain from bothering her at night. Pt denies injury but she walks a lot. Pt denies back problems, vaginal bleeding, or discharge. No urinary symptoms or diarrhea. Pt has not tried anything to modify pain.  Past Medical History  Diagnosis Date  . Asthma   . Rhinitis   . GERD (gastroesophageal reflux disease)   . Migraine   . Hypersomnia with sleep apnea   . Hypertension    Past Surgical History  Procedure Laterality Date  . Cholecystectomy    . Tooth extraction     Family History  Problem Relation Age of Onset  . Diabetes Father   . Early death Father   . Diabetes Mother   . COPD Mother   . Rheum arthritis Mother   . Hyperlipidemia Brother   . Alcohol abuse Neg Hx   . Cancer Neg Hx   . Depression Neg Hx   . Drug abuse Neg Hx   . Hearing loss Neg Hx   . Heart disease Neg Hx   . Hypertension Neg Hx   . Stroke Neg Hx   . Thyroid disease Brother    History  Substance Use Topics  . Smoking status: Former Smoker -- 0.10 packs/day for 1 years     Types: Cigarettes    Quit date: 10/09/1999  . Smokeless tobacco: Never Used  . Alcohol Use: Yes     Comment: 1 glass of white wine monthly   OB History   Grav Para Term Preterm Abortions TAB SAB Ect Mult Living                 Review of Systems  Constitutional: Negative for activity change.  Cardiovascular: Negative for leg swelling.  Gastrointestinal: Negative for nausea, vomiting and diarrhea.  Genitourinary: Negative for vaginal bleeding and vaginal discharge.  Musculoskeletal: Positive for arthralgias and myalgias. Negative for back pain, gait problem, joint swelling and neck pain.  Skin: Negative for wound.  Neurological: Negative for weakness and numbness.      Allergies  Codeine  Home Medications   Prior to Admission medications   Medication Sig Start Date End Date Taking? Authorizing Provider  benazepril-hydrochlorthiazide (LOTENSIN HCT) 20-12.5 MG per tablet Take 1 tablet by mouth daily. 03/12/14   [name], MD  cetirizine (ZYRTEC) 10 MG tablet Take 10 mg by mouth daily.    Historical Provider, MD  fluticasone (FLONASE) 50 MCG/ACT nasal spray Place 2  sprays into the nose daily as needed for allergies.     Historical Provider, MD  montelukast (SINGULAIR) 10 MG tablet Take 10 mg by mouth at bedtime.    Historical Provider, MD  Multiple Vitamin (MULTIVITAMIN) capsule Take 1 capsule by mouth daily.    Historical Provider, MD  Multiple Vitamins-Minerals (HAIR/SKIN/NAILS) TABS Take by mouth.    Historical Provider, MD  Naltrexone-Bupropion HCl ER (CONTRAVE) 8-90 MG TB12 Take 2 tablets by mouth 2 (two) times daily. 02/24/14   [name], MD  norgestimate-ethinyl estradiol ([place] 28) 0.25-35 MG-MCG per tablet Take 1 tablet by mouth daily.      Historical Provider, MD  omeprazole (PRILOSEC) 40 MG capsule Take 40 mg by mouth daily.    Historical Provider, MD   There were no vitals taken for this visit. Physical Exam  Nursing note and vitals  reviewed. Constitutional: She appears well-developed and well-nourished.  HENT:  Head: Normocephalic and atraumatic.  Eyes: Pupils are equal, round, and reactive to light.  Neck: Normal range of motion. Neck supple.  Cardiovascular: Exam reveals no decreased pulses.   Pulses:      Dorsalis pedis pulses are 2+ on the right side, and 2+ on the left side.       Posterior tibial pulses are 2+ on the right side, and 2+ on the left side.  Musculoskeletal: She exhibits no edema. Tenderness: crease R groin.       Right hip: She exhibits tenderness. She exhibits normal range of motion, normal strength, no bony tenderness and no swelling.       Right knee: Normal.       Right ankle: Normal.       Lumbar back: Normal.       Right upper leg: Normal.  Neurological: She is alert. No sensory deficit.  Motor, sensation, and vascular distal to the injury is fully intact.   Skin: Skin is warm and dry.  Psychiatric: She has a normal mood and affect.    ED Course  Procedures  DIAGNOSTIC STUDIES: Oxygen Saturation is 100% on RA, normal by my interpretation.  COORDINATION OF CARE: 4:06 PM-Discussed treatment plan which includes continued monitoring at home, anti-inflammatory drug, heat compress, stretching, along with follow-up with an orthopedist with pt at bedside and pt agreed to plan.  Labs Review Labs Reviewed - No data to display  Imaging Review No results found.   EKG Interpretation None      Vital signs reviewed and are as follows: Filed Vitals:   06/07/14 1552  BP: 124/73  Pulse: 81  Temp: 98.6 F (37 C)  Resp: 18   Patient was counseled on RICE protocol and told to rest injury, use ice for no longer than 15 minutes every hour, compress the area, and elevate above the level of their heart as much as possible to reduce swelling. Questions answered. Patient verbalized understanding.    She has orthopedist. Encouraged f/u if not improved with conservative treatment.   Patient  counseled on proper use of muscle relaxant medication.  They were told not to drink alcohol, drive any vehicle, or do any dangerous activities while taking this medication.  Patient verbalized understanding.    MDM   Final diagnoses:  Groin strain, initial encounter   Patient with muscular pain -- worse with movement, stretching and palpation. No character of neuropathic pain. Full ROM of joint. No GU symptoms. No urinary symptoms.   I personally performed the services described in this documentation, which was scribed  in my presence. The recorded information has been reviewed and is accurate.      Carlisle Cater, PA-C 06/07/14 [phone]

## 2014-06-07 NOTE — ED Notes (Signed)
Pt c/o right groin pain that been going on for about week.  Pt sits for a while then trys to get up states pain gets really bad.

## 2014-06-07 NOTE — Discharge Instructions (Signed)
Please read and follow all provided instructions.  Your diagnoses today include:  1. Groin strain, initial encounter    Tests performed today include:  Vital signs. See below for your results today.   Medications prescribed:   Naproxen - anti-inflammatory pain medication  Do not exceed 53m naproxen every 12 hours, take with food  You have been prescribed an anti-inflammatory medication or NSAID. Take with food. Take smallest effective dose for the shortest duration needed for your pain. Stop taking if you experience stomach pain or vomiting.    Robaxin (methocarbamol) - muscle relaxer medication  DO NOT drive or perform any activities that require you to be awake and alert because this medicine can make you drowsy.   Take any prescribed medications only as directed.  Home care instructions:   Follow any educational materials contained in this packet  Follow R.I.C.E. Protocol:  R - rest your injury   I  - use ice on injury without applying directly to skin  C - compress injury with bandage or splint  E - elevate the injury as much as possible  Follow-up instructions: Please follow-up with your primary care provider or the provided orthopedic physician (bone specialist) if you continue to have significant pain or trouble walking in 1 week. In this case you may have a severe injury that requires further care.   Return instructions:   Please return if your toes are numb or tingling, appear gray or blue, or you have severe pain (also elevate leg and loosen splint or wrap if you were given one)  Please return to the Emergency Department if you experience worsening symptoms.   Please return if you have any other emergent concerns.  Additional Information:  Your vital signs today were: BP 124/73   Pulse 81   Temp(Src) 98.6 F (37 C) (Oral)   Resp 18   SpO2 100% If your blood pressure (BP) was elevated above 135/85 this visit, please have this repeated by your doctor  within one month. --------------

## 2014-07-12 ENCOUNTER — Ambulatory Visit: Payer: 59 | Admitting: Internal Medicine

## 2014-07-19 ENCOUNTER — Encounter: Payer: 59 | Attending: Internal Medicine | Admitting: Dietician

## 2014-07-19 DIAGNOSIS — Z713 Dietary counseling and surveillance: Secondary | ICD-10-CM | POA: Diagnosis not present

## 2014-07-19 DIAGNOSIS — Z6841 Body Mass Index (BMI) 40.0 and over, adult: Secondary | ICD-10-CM | POA: Insufficient documentation

## 2014-07-19 NOTE — Progress Notes (Signed)
Medical Nutrition Therapy:  Appt start time: 0940 end time:  1000.  Follow-up:  Alicia Hoover returns having lost 15-20 pounds since last visit. She has been taking Contrave to suppress appetite. She reports making better choices and drinking more water and avoiding starches. However, she has not been able to exercise due to possible bulging disc causing pain in her groin. Making protein shakes at home and trying not to eat out as much. Highest weight 325 lbs. Short term goal: 280 lbs.   Wt Readings from Last 3 Encounters:  07/19/14 306 lb (138.801 kg)  05/31/14 313 lb (141.976 kg)  05/21/14 313 lb (141.976 kg)   Ht Readings from Last 3 Encounters:  05/31/14 5' 5"  (1.651 m)  05/21/14 5' 5"  (1.651 m)  04/05/14 5' 5"  (1.651 m)   Body mass index is 50.92 kg/(m^2). @BMIFA @ Normalized weight-for-age data available only for age 51 to 50 years. Normalized stature-for-age data available only for age 51 to 41 years.   Preferred Learning Style:   No preference indicated   Learning Readiness:   Change in progress  MEDICATIONS: see list. Pt reports recent appetite reduction secondary to Contrave.    DIETARY INTAKE: Recently portion control and cravings for high sugar or fat snack foods has decreased substantially due to contrave. Usual eating pattern includes 3 meals and 2-3 snacks per day. Everyday foods include fruit, egg white mcmuffin.  Avoided foods include candy, chips, sodas, sweet tea.    24-hr recall:  B ( AM): lately 1-2 egg white on english muffin with cheese from [employer] and blueberry pomegranate smoothie. Used to be bacon, egg, and cheese, bagel.   Snk ( AM): fruits or nothing. Used to choose mainly chips and candy bars, candy. L ( PM): chix alfredo with broccoli. Salads (lettuce, tomato, cucumbers, sometimes with steak and feta cheese). Used to be routinely [place]. Now smaller portions of chinese food with more focus on vegetable. water Snk ( PM): used to be candy, chips. Now  fruit or nothing.  D ( PM): olive garden- salad and breadsticks only. Pizza, hamburgers, hot dogs, leftover fettucine alfredo. Steak salad. Water. Sometimes a cup or two of OJ.  Snk ( PM): ice cream single serving cup x 2. Most often canned fruit in juice.  Beverages: water, juice, smoothie, EtOH only about once per month, no kool aid, no lemonade, no sweet tea, no coffee  Usual physical activity: cleaning after kids and using steps some at home. Apartment complex has gym, but pt does not use regularly. Pt occupation is very sedentary, working at a call center. Pt also has a bone spur in ankle that limits walking and many traditional exercises.  Progress Towards Goal(s):  In progress.   Nutritional Diagnosis:  [place]-3.3 Overweight/obesity As related to hx of high kcal food choices, very low physical activity.  As evidenced by BMI>30, pt reports of previous routine intake of large portions, high kcal snack foods, no exercise.    Intervention:  Nutrition counseling. Primary points of emphasis were choosing lower fat proteins, continuing to control portions of starches at meals, increasing portions of nonstarchy vegetables, best snack options, keeping protein intake high while on low kcal diet to optimize fat loss. RD demonstrated the Plate Method for best portion control, discussed alterations to restaurant choices, and emphasized goal of 4 oz. Protein food at each meal. RD provided protein powder options to keep protein intake high at meals when pt is not overly hungry. RD provided kcal Rx to be tracked on  smartphone- 2050 kcal per day with minimum 125 g protein per day.   Teaching Method Utilized:  Visual Auditory   Barriers to learning/adherence to lifestyle change: hx of preferences for high sugar and high fat foods, minimal exercise hx  Demonstrated degree of understanding via:  Teach Back   Monitoring/Evaluation:  Dietary intake, exercise, portion control, and body weight prn.

## 2014-08-03 ENCOUNTER — Other Ambulatory Visit: Payer: Self-pay | Admitting: Orthopedic Surgery

## 2014-08-03 DIAGNOSIS — M25551 Pain in right hip: Secondary | ICD-10-CM

## 2014-08-18 ENCOUNTER — Ambulatory Visit
Admission: RE | Admit: 2014-08-18 | Discharge: 2014-08-18 | Disposition: A | Discharge status: Home / Self Care | Payer: 59 | Source: Ambulatory Visit | Attending: Orthopedic Surgery | Admitting: Orthopedic Surgery

## 2014-08-18 DIAGNOSIS — M25551 Pain in right hip: Secondary | ICD-10-CM

## 2014-08-18 MED ORDER — METHYLPREDNISOLONE ACETATE 40 MG/ML INJ SUSP (RADIOLOG
120.0000 mg | Freq: Once | INTRAMUSCULAR | Status: AC
  Administered 2014-08-18: 120 mg via INTRA_ARTICULAR

## 2014-08-18 MED ORDER — IOHEXOL 180 MG/ML  SOLN
15.0000 mL | Freq: Once | INTRAMUSCULAR | Status: AC | PRN
  Administered 2014-08-18: 15 mL via INTRA_ARTICULAR

## 2014-08-30 ENCOUNTER — Encounter: Payer: Self-pay | Admitting: Internal Medicine

## 2014-08-31 ENCOUNTER — Ambulatory Visit: Payer: 59 | Admitting: Internal Medicine

## 2014-09-13 ENCOUNTER — Telehealth: Payer: Self-pay | Admitting: Internal Medicine

## 2014-09-13 NOTE — Telephone Encounter (Signed)
Pt called stated that Contrave is not cover by her insurance, so the asistant normally start PA to help pt. Please help, she only got 1 week left.

## 2014-09-17 NOTE — Telephone Encounter (Signed)
Started PA for [employer]

## 2014-09-20 NOTE — Telephone Encounter (Signed)
Pt called in with number to call for PA [phone]

## 2014-10-21 ENCOUNTER — Ambulatory Visit: Payer: 59 | Admitting: Internal Medicine

## 2014-10-27 ENCOUNTER — Other Ambulatory Visit: Payer: Self-pay | Admitting: Internal Medicine

## 2014-11-04 ENCOUNTER — Telehealth: Payer: Self-pay

## 2014-11-04 NOTE — Telephone Encounter (Signed)
PA for Contrave initiated via phone and approved.   Faxed PA approval to pharmacy.   Pt informed of results

## 2014-11-15 ENCOUNTER — Other Ambulatory Visit: Payer: Self-pay | Admitting: Orthopedic Surgery

## 2014-11-15 DIAGNOSIS — M545 Low back pain: Secondary | ICD-10-CM

## 2014-11-17 ENCOUNTER — Other Ambulatory Visit: Payer: Self-pay | Admitting: Internal Medicine

## 2014-11-27 ENCOUNTER — Ambulatory Visit
Admission: RE | Admit: 2014-11-27 | Discharge: 2014-11-27 | Disposition: A | Discharge status: Home / Self Care | Payer: Self-pay | Source: Ambulatory Visit | Attending: Orthopedic Surgery | Admitting: Orthopedic Surgery

## 2014-11-27 DIAGNOSIS — M545 Low back pain: Secondary | ICD-10-CM

## 2014-12-13 ENCOUNTER — Other Ambulatory Visit: Payer: Self-pay | Admitting: Internal Medicine

## 2014-12-14 ENCOUNTER — Encounter: Payer: Self-pay | Admitting: Internal Medicine

## 2014-12-14 ENCOUNTER — Other Ambulatory Visit (INDEPENDENT_AMBULATORY_CARE_PROVIDER_SITE_OTHER): Payer: 59

## 2014-12-14 ENCOUNTER — Ambulatory Visit (INDEPENDENT_AMBULATORY_CARE_PROVIDER_SITE_OTHER): Payer: 59 | Admitting: Internal Medicine

## 2014-12-14 ENCOUNTER — Telehealth: Payer: Self-pay | Admitting: Internal Medicine

## 2014-12-14 VITALS — BP 138/88 | HR 81 | Temp 98.7°F | Resp 16 | Ht 65.0 in | Wt 298.0 lb

## 2014-12-14 DIAGNOSIS — I10 Essential (primary) hypertension: Secondary | ICD-10-CM

## 2014-12-14 DIAGNOSIS — Z0000 Encounter for general adult medical examination without abnormal findings: Secondary | ICD-10-CM

## 2014-12-14 LAB — LIPID PANEL
Cholesterol: 136 mg/dL (ref 0–200)
HDL: 40.6 mg/dL (ref >39.00)
LDL CALC: 71 mg/dL (ref 0–99)
NONHDL: 95.4
Total CHOL/HDL Ratio: 3
Triglycerides: 124 mg/dL (ref 0.0–149.0)
VLDL: 24.8 mg/dL (ref 0.0–40.0)

## 2014-12-14 LAB — CBC WITH DIFFERENTIAL/PLATELET
BASOS ABS: 0 10*3/uL (ref 0.0–0.1)
Basophils Relative: 0.3 % (ref 0.0–3.0)
Eosinophils Absolute: 0.1 10*3/uL (ref 0.0–0.7)
Eosinophils Relative: 0.8 % (ref 0.0–5.0)
HCT: 37.1 % (ref 36.0–46.0)
Hemoglobin: 12.2 g/dL (ref 12.0–15.0)
LYMPHS PCT: 30 % (ref 12.0–46.0)
Lymphs Abs: 2.3 10*3/uL (ref 0.7–4.0)
MCHC: 32.8 g/dL (ref 30.0–36.0)
MCV: 75.9 fl — ABNORMAL LOW (ref 78.0–100.0)
Monocytes Absolute: 0.5 10*3/uL (ref 0.1–1.0)
Monocytes Relative: 6.9 % (ref 3.0–12.0)
Neutro Abs: 4.7 10*3/uL (ref 1.4–7.7)
Neutrophils Relative %: 62 % (ref 43.0–77.0)
Platelets: 358 10*3/uL (ref 150.0–400.0)
RBC: 4.89 Mil/uL (ref 3.87–5.11)
RDW: 15 % (ref 11.5–15.5)
WBC: 7.6 10*3/uL (ref 4.0–10.5)

## 2014-12-14 LAB — COMPREHENSIVE METABOLIC PANEL
ALT: 30 U/L (ref 0–35)
AST: 18 U/L (ref 0–37)
Albumin: 4 g/dL (ref 3.5–5.2)
Alkaline Phosphatase: 77 U/L (ref 39–117)
BUN: 14 mg/dL (ref 6–23)
CHLORIDE: 104 meq/L (ref 96–112)
CO2: 30 meq/L (ref 19–32)
Calcium: 9.1 mg/dL (ref 8.4–10.5)
Creatinine, Ser: 0.91 mg/dL (ref 0.40–1.20)
GFR: 88.13 mL/min (ref >60.00)
Glucose, Bld: 94 mg/dL (ref 70–99)
Potassium: 4 mEq/L (ref 3.5–5.1)
Sodium: 137 mEq/L (ref 135–145)
Total Bilirubin: 0.3 mg/dL (ref 0.2–1.2)
Total Protein: 7.1 g/dL (ref 6.0–8.3)

## 2014-12-14 LAB — TSH: TSH: 1.36 u[IU]/mL (ref 0.35–4.50)

## 2014-12-14 MED ORDER — BENAZEPRIL-HYDROCHLOROTHIAZIDE 20-12.5 MG PO TABS: 1.0000 | ORAL_TABLET | Freq: Every day | ORAL | Status: DC

## 2014-12-14 NOTE — Patient Instructions (Signed)
Preventive Care for Adults A healthy lifestyle and preventive care can promote health and wellness. Preventive health guidelines for women include the following key practices.  A routine yearly physical is a good way to check with your health care provider about your health and preventive screening. It is a chance to share any concerns and updates on your health and to receive a thorough exam.  Visit your dentist for a routine exam and preventive care every 6 months. Brush your teeth twice a day and floss once a day. Good oral hygiene prevents tooth decay and gum disease.  The frequency of eye exams is based on your age, health, family medical history, use of contact lenses, and other factors. Follow your health care provider's recommendations for frequency of eye exams.  Eat a healthy diet. Foods like vegetables, fruits, whole grains, low-fat dairy products, and lean protein foods contain the nutrients you need without too many calories. Decrease your intake of foods high in solid fats, added sugars, and salt. Eat the right amount of calories for you.Get information about a proper diet from your health care provider, if necessary.  Regular physical exercise is one of the most important things you can do for your health. Most adults should get at least 150 minutes of moderate-intensity exercise (any activity that increases your heart rate and causes you to sweat) each week. In addition, most adults need muscle-strengthening exercises on 2 or more days a week.  Maintain a healthy weight. The body mass index (BMI) is a screening tool to identify possible weight problems. It provides an estimate of body fat based on height and weight. Your health care provider can find your BMI and can help you achieve or maintain a healthy weight.For adults 20 years and older:  A BMI below 18.5 is considered underweight.  A BMI of 18.5 to 24.9 is normal.  A BMI of 25 to 29.9 is considered overweight.  A BMI of  30 and above is considered obese.  Maintain normal blood lipids and cholesterol levels by exercising and minimizing your intake of saturated fat. Eat a balanced diet with plenty of fruit and vegetables. Blood tests for lipids and cholesterol should begin at age 76 and be repeated every 5 years. If your lipid or cholesterol levels are high, you are over 50, or you are at high risk for heart disease, you may need your cholesterol levels checked more frequently.Ongoing high lipid and cholesterol levels should be treated with medicines if diet and exercise are not working.  If you smoke, find out from your health care provider how to quit. If you do not use tobacco, do not start.  Lung cancer screening is recommended for adults aged 22-80 years who are at high risk for developing lung cancer because of a history of smoking. A yearly low-dose CT scan of the lungs is recommended for people who have at least a 30-pack-year history of smoking and are a current smoker or have quit within the past 15 years. A pack year of smoking is smoking an average of 1 pack of cigarettes a day for 1 year (for example: 1 pack a day for 30 years or 2 packs a day for 15 years). Yearly screening should continue until the smoker has stopped smoking for at least 15 years. Yearly screening should be stopped for people who develop a health problem that would prevent them from having lung cancer treatment.  If you are pregnant, do not drink alcohol. If you are breastfeeding,  be very cautious about drinking alcohol. If you are not pregnant and choose to drink alcohol, do not have more than 1 drink per day. One drink is considered to be 12 ounces (355 mL) of beer, 5 ounces (148 mL) of wine, or 1.5 ounces (44 mL) of liquor.  Avoid use of street drugs. Do not share needles with anyone. Ask for help if you need support or instructions about stopping the use of drugs.  High blood pressure causes heart disease and increases the risk of  stroke. Your blood pressure should be checked at least every 1 to 2 years. Ongoing high blood pressure should be treated with medicines if weight loss and exercise do not work.  If you are 75-52 years old, ask your health care provider if you should take aspirin to prevent strokes.  Diabetes screening involves taking a blood sample to check your fasting blood sugar level. This should be done once every 3 years, after age 15, if you are within normal weight and without risk factors for diabetes. Testing should be considered at a younger age or be carried out more frequently if you are overweight and have at least 1 risk factor for diabetes.  Breast cancer screening is essential preventive care for women. You should practice "breast self-awareness." This means understanding the normal appearance and feel of your breasts and may include breast self-examination. Any changes detected, no matter how small, should be reported to a health care provider. Women in their 58s and 30s should have a clinical breast exam (CBE) by a health care provider as part of a regular health exam every 1 to 3 years. After age 16, women should have a CBE every year. Starting at age 53, women should consider having a mammogram (breast X-ray test) every year. Women who have a family history of breast cancer should talk to their health care provider about genetic screening. Women at a high risk of breast cancer should talk to their health care providers about having an MRI and a mammogram every year.  Breast cancer gene (BRCA)-related cancer risk assessment is recommended for women who have family members with BRCA-related cancers. BRCA-related cancers include breast, ovarian, tubal, and peritoneal cancers. Having family members with these cancers may be associated with an increased risk for harmful changes (mutations) in the breast cancer genes BRCA1 and BRCA2. Results of the assessment will determine the need for genetic counseling and  BRCA1 and BRCA2 testing.  Routine pelvic exams to screen for cancer are no longer recommended for nonpregnant women who are considered low risk for cancer of the pelvic organs (ovaries, uterus, and vagina) and who do not have symptoms. Ask your health care provider if a screening pelvic exam is right for you.  If you have had past treatment for cervical cancer or a condition that could lead to cancer, you need Pap tests and screening for cancer for at least 20 years after your treatment. If Pap tests have been discontinued, your risk factors (such as having a new sexual partner) need to be reassessed to determine if screening should be resumed. Some women have medical problems that increase the chance of getting cervical cancer. In these cases, your health care provider may recommend more frequent screening and Pap tests.  The HPV test is an additional test that may be used for cervical cancer screening. The HPV test looks for the virus that can cause the cell changes on the cervix. The cells collected during the Pap test can be  tested for HPV. The HPV test could be used to screen women aged 30 years and older, and should be used in women of any age who have unclear Pap test results. After the age of 30, women should have HPV testing at the same frequency as a Pap test.  Colorectal cancer can be detected and often prevented. Most routine colorectal cancer screening begins at the age of 50 years and continues through age 75 years. However, your health care provider may recommend screening at an earlier age if you have risk factors for colon cancer. On a yearly basis, your health care provider may provide home test kits to check for hidden blood in the stool. Use of a small camera at the end of a tube, to directly examine the colon (sigmoidoscopy or colonoscopy), can detect the earliest forms of colorectal cancer. Talk to your health care provider about this at age 50, when routine screening begins. Direct  exam of the colon should be repeated every 5-10 years through age 75 years, unless early forms of pre-cancerous polyps or small growths are found.  People who are at an increased risk for hepatitis B should be screened for this virus. You are considered at high risk for hepatitis B if:  You were born in a country where hepatitis B occurs often. Talk with your health care provider about which countries are considered high risk.  Your parents were born in a high-risk country and you have not received a shot to protect against hepatitis B (hepatitis B vaccine).  You have HIV or AIDS.  You use needles to inject street drugs.  You live with, or have sex with, someone who has hepatitis B.  You get hemodialysis treatment.  You take certain medicines for conditions like cancer, organ transplantation, and autoimmune conditions.  Hepatitis C blood testing is recommended for all people born from 1945 through 1965 and any individual with known risks for hepatitis C.  Practice safe sex. Use condoms and avoid high-risk sexual practices to reduce the spread of sexually transmitted infections (STIs). STIs include gonorrhea, chlamydia, syphilis, trichomonas, herpes, HPV, and human immunodeficiency virus (HIV). Herpes, HIV, and HPV are viral illnesses that have no cure. They can result in disability, cancer, and death.  You should be screened for sexually transmitted illnesses (STIs) including gonorrhea and chlamydia if:  You are sexually active and are younger than 24 years.  You are older than 24 years and your health care provider tells you that you are at risk for this type of infection.  Your sexual activity has changed since you were last screened and you are at an increased risk for chlamydia or gonorrhea. Ask your health care provider if you are at risk.  If you are at risk of being infected with HIV, it is recommended that you take a prescription medicine daily to prevent HIV infection. This is  called preexposure prophylaxis (PrEP). You are considered at risk if:  You are a heterosexual woman, are sexually active, and are at increased risk for HIV infection.  You take drugs by injection.  You are sexually active with a partner who has HIV.  Talk with your health care provider about whether you are at high risk of being infected with HIV. If you choose to begin PrEP, you should first be tested for HIV. You should then be tested every 3 months for as long as you are taking PrEP.  Osteoporosis is a disease in which the bones lose minerals and strength   with aging. This can result in serious bone fractures or breaks. The risk of osteoporosis can be identified using a bone density scan. Women ages 65 years and over and women at risk for fractures or osteoporosis should discuss screening with their health care providers. Ask your health care provider whether you should take a calcium supplement or vitamin D to reduce the rate of osteoporosis.  Menopause can be associated with physical symptoms and risks. Hormone replacement therapy is available to decrease symptoms and risks. You should talk to your health care provider about whether hormone replacement therapy is right for you.  Use sunscreen. Apply sunscreen liberally and repeatedly throughout the day. You should seek shade when your shadow is shorter than you. Protect yourself by wearing long sleeves, pants, a wide-brimmed hat, and sunglasses year round, whenever you are outdoors.  Once a month, do a whole body skin exam, using a mirror to look at the skin on your back. Tell your health care provider of new moles, moles that have irregular borders, moles that are larger than a pencil eraser, or moles that have changed in shape or color.  Stay current with required vaccines (immunizations).  Influenza vaccine. All adults should be immunized every year.  Tetanus, diphtheria, and acellular pertussis (Td, Tdap) vaccine. Pregnant women should  receive 1 dose of Tdap vaccine during each pregnancy. The dose should be obtained regardless of the length of time since the last dose. Immunization is preferred during the 27th-36th week of gestation. An adult who has not previously received Tdap or who does not know her vaccine status should receive 1 dose of Tdap. This initial dose should be followed by tetanus and diphtheria toxoids (Td) booster doses every 10 years. Adults with an unknown or incomplete history of completing a 3-dose immunization series with Td-containing vaccines should begin or complete a primary immunization series including a Tdap dose. Adults should receive a Td booster every 10 years.  Varicella vaccine. An adult without evidence of immunity to varicella should receive 2 doses or a second dose if she has previously received 1 dose. Pregnant females who do not have evidence of immunity should receive the first dose after pregnancy. This first dose should be obtained before leaving the health care facility. The second dose should be obtained 4-8 weeks after the first dose.  Human papillomavirus (HPV) vaccine. Females aged 13-26 years who have not received the vaccine previously should obtain the 3-dose series. The vaccine is not recommended for use in pregnant females. However, pregnancy testing is not needed before receiving a dose. If a female is found to be pregnant after receiving a dose, no treatment is needed. In that case, the remaining doses should be delayed until after the pregnancy. Immunization is recommended for any person with an immunocompromised condition through the age of 26 years if she did not get any or all doses earlier. During the 3-dose series, the second dose should be obtained 4-8 weeks after the first dose. The third dose should be obtained 24 weeks after the first dose and 16 weeks after the second dose.  Zoster vaccine. One dose is recommended for adults aged 60 years or older unless certain conditions are  present.  Measles, mumps, and rubella (MMR) vaccine. Adults born before 1957 generally are considered immune to measles and mumps. Adults born in 1957 or later should have 1 or more doses of MMR vaccine unless there is a contraindication to the vaccine or there is laboratory evidence of immunity to   each of the three diseases. A routine second dose of MMR vaccine should be obtained at least 28 days after the first dose for students attending postsecondary schools, health care workers, or international travelers. People who received inactivated measles vaccine or an unknown type of measles vaccine during 1963-1967 should receive 2 doses of MMR vaccine. People who received inactivated mumps vaccine or an unknown type of mumps vaccine before 1979 and are at high risk for mumps infection should consider immunization with 2 doses of MMR vaccine. For females of childbearing age, rubella immunity should be determined. If there is no evidence of immunity, females who are not pregnant should be vaccinated. If there is no evidence of immunity, females who are pregnant should delay immunization until after pregnancy. Unvaccinated health care workers born before 1957 who lack laboratory evidence of measles, mumps, or rubella immunity or laboratory confirmation of disease should consider measles and mumps immunization with 2 doses of MMR vaccine or rubella immunization with 1 dose of MMR vaccine.  Pneumococcal 13-valent conjugate (PCV13) vaccine. When indicated, a person who is uncertain of her immunization history and has no record of immunization should receive the PCV13 vaccine. An adult aged 19 years or older who has certain medical conditions and has not been previously immunized should receive 1 dose of PCV13 vaccine. This PCV13 should be followed with a dose of pneumococcal polysaccharide (PPSV23) vaccine. The PPSV23 vaccine dose should be obtained at least 8 weeks after the dose of PCV13 vaccine. An adult aged 19  years or older who has certain medical conditions and previously received 1 or more doses of PPSV23 vaccine should receive 1 dose of PCV13. The PCV13 vaccine dose should be obtained 1 or more years after the last PPSV23 vaccine dose.  Pneumococcal polysaccharide (PPSV23) vaccine. When PCV13 is also indicated, PCV13 should be obtained first. All adults aged 65 years and older should be immunized. An adult younger than age 65 years who has certain medical conditions should be immunized. Any person who resides in a nursing home or long-term care facility should be immunized. An adult smoker should be immunized. People with an immunocompromised condition and certain other conditions should receive both PCV13 and PPSV23 vaccines. People with human immunodeficiency virus (HIV) infection should be immunized as soon as possible after diagnosis. Immunization during chemotherapy or radiation therapy should be avoided. Routine use of PPSV23 vaccine is not recommended for American Indians, [place] Natives, or people younger than 65 years unless there are medical conditions that require PPSV23 vaccine. When indicated, people who have unknown immunization and have no record of immunization should receive PPSV23 vaccine. One-time revaccination 5 years after the first dose of PPSV23 is recommended for people aged 19-64 years who have chronic kidney failure, nephrotic syndrome, asplenia, or immunocompromised conditions. People who received 1-2 doses of PPSV23 before age 65 years should receive another dose of PPSV23 vaccine at age 65 years or later if at least 5 years have passed since the previous dose. Doses of PPSV23 are not needed for people immunized with PPSV23 at or after age 65 years.  Meningococcal vaccine. Adults with asplenia or persistent complement component deficiencies should receive 2 doses of quadrivalent meningococcal conjugate (MenACWY-D) vaccine. The doses should be obtained at least 2 months apart.  Microbiologists working with certain meningococcal bacteria, military recruits, people at risk during an outbreak, and people who travel to or live in countries with a high rate of meningitis should be immunized. A first-year college student up through age   21 years who is living in a residence hall should receive a dose if she did not receive a dose on or after her 16th birthday. Adults who have certain high-risk conditions should receive one or more doses of vaccine.  Hepatitis A vaccine. Adults who wish to be protected from this disease, have certain high-risk conditions, work with hepatitis A-infected animals, work in hepatitis A research labs, or travel to or work in countries with a high rate of hepatitis A should be immunized. Adults who were previously unvaccinated and who anticipate close contact with an international adoptee during the first 60 days after arrival in the [place] from a country with a high rate of hepatitis A should be immunized.  Hepatitis B vaccine. Adults who wish to be protected from this disease, have certain high-risk conditions, may be exposed to blood or other infectious body fluids, are household contacts or sex partners of hepatitis B positive people, are clients or workers in certain care facilities, or travel to or work in countries with a high rate of hepatitis B should be immunized.  Haemophilus influenzae type b (Hib) vaccine. A previously unvaccinated person with asplenia or sickle cell disease or having a scheduled splenectomy should receive 1 dose of Hib vaccine. Regardless of previous immunization, a recipient of a hematopoietic stem cell transplant should receive a 3-dose series 6-12 months after her successful transplant. Hib vaccine is not recommended for adults with HIV infection. Preventive Services / Frequency Ages 64 to 68 years  Blood pressure check.** / Every 1 to 2 years.  Lipid and cholesterol check.** / Every 5 years beginning at age  22.  Clinical breast exam.** / Every 3 years for women in their 88s and 53s.  BRCA-related cancer risk assessment.** / For women who have family members with a BRCA-related cancer (breast, ovarian, tubal, or peritoneal cancers).  Pap test.** / Every 2 years from ages 90 through 51. Every 3 years starting at age 21 through age 56 or 3 with a history of 3 consecutive normal Pap tests.  HPV screening.** / Every 3 years from ages 24 through ages 1 to 46 with a history of 3 consecutive normal Pap tests.  Hepatitis C blood test.** / For any individual with known risks for hepatitis C.  Skin self-exam. / Monthly.  Influenza vaccine. / Every year.  Tetanus, diphtheria, and acellular pertussis (Tdap, Td) vaccine.** / Consult your health care provider. Pregnant women should receive 1 dose of Tdap vaccine during each pregnancy. 1 dose of Td every 10 years.  Varicella vaccine.** / Consult your health care provider. Pregnant females who do not have evidence of immunity should receive the first dose after pregnancy.  HPV vaccine. / 3 doses over 6 months, if 72 and younger. The vaccine is not recommended for use in pregnant females. However, pregnancy testing is not needed before receiving a dose.  Measles, mumps, rubella (MMR) vaccine.** / You need at least 1 dose of MMR if you were born in 1957 or later. You may also need a 2nd dose. For females of childbearing age, rubella immunity should be determined. If there is no evidence of immunity, females who are not pregnant should be vaccinated. If there is no evidence of immunity, females who are pregnant should delay immunization until after pregnancy.  Pneumococcal 13-valent conjugate (PCV13) vaccine.** / Consult your health care provider.  Pneumococcal polysaccharide (PPSV23) vaccine.** / 1 to 2 doses if you smoke cigarettes or if you have certain conditions.  Meningococcal vaccine.** /  1 dose if you are age 19 to 21 years and a first-year college  student living in a residence hall, or have one of several medical conditions, you need to get vaccinated against meningococcal disease. You may also need additional booster doses.  Hepatitis A vaccine.** / Consult your health care provider.  Hepatitis B vaccine.** / Consult your health care provider.  Haemophilus influenzae type b (Hib) vaccine.** / Consult your health care provider. Ages 40 to 64 years  Blood pressure check.** / Every 1 to 2 years.  Lipid and cholesterol check.** / Every 5 years beginning at age 20 years.  Lung cancer screening. / Every year if you are aged 55-80 years and have a 30-pack-year history of smoking and currently smoke or have quit within the past 15 years. Yearly screening is stopped once you have quit smoking for at least 15 years or develop a health problem that would prevent you from having lung cancer treatment.  Clinical breast exam.** / Every year after age 40 years.  BRCA-related cancer risk assessment.** / For women who have family members with a BRCA-related cancer (breast, ovarian, tubal, or peritoneal cancers).  Mammogram.** / Every year beginning at age 40 years and continuing for as long as you are in good health. Consult with your health care provider.  Pap test.** / Every 3 years starting at age 30 years through age 65 or 70 years with a history of 3 consecutive normal Pap tests.  HPV screening.** / Every 3 years from ages 30 years through ages 65 to 70 years with a history of 3 consecutive normal Pap tests.  Fecal occult blood test (FOBT) of stool. / Every year beginning at age 50 years and continuing until age 75 years. You may not need to do this test if you get a colonoscopy every 10 years.  Flexible sigmoidoscopy or colonoscopy.** / Every 5 years for a flexible sigmoidoscopy or every 10 years for a colonoscopy beginning at age 50 years and continuing until age 75 years.  Hepatitis C blood test.** / For all people born from 1945 through  1965 and any individual with known risks for hepatitis C.  Skin self-exam. / Monthly.  Influenza vaccine. / Every year.  Tetanus, diphtheria, and acellular pertussis (Tdap/Td) vaccine.** / Consult your health care provider. Pregnant women should receive 1 dose of Tdap vaccine during each pregnancy. 1 dose of Td every 10 years.  Varicella vaccine.** / Consult your health care provider. Pregnant females who do not have evidence of immunity should receive the first dose after pregnancy.  Zoster vaccine.** / 1 dose for adults aged 60 years or older.  Measles, mumps, rubella (MMR) vaccine.** / You need at least 1 dose of MMR if you were born in 1957 or later. You may also need a 2nd dose. For females of childbearing age, rubella immunity should be determined. If there is no evidence of immunity, females who are not pregnant should be vaccinated. If there is no evidence of immunity, females who are pregnant should delay immunization until after pregnancy.  Pneumococcal 13-valent conjugate (PCV13) vaccine.** / Consult your health care provider.  Pneumococcal polysaccharide (PPSV23) vaccine.** / 1 to 2 doses if you smoke cigarettes or if you have certain conditions.  Meningococcal vaccine.** / Consult your health care provider.  Hepatitis A vaccine.** / Consult your health care provider.  Hepatitis B vaccine.** / Consult your health care provider.  Haemophilus influenzae type b (Hib) vaccine.** / Consult your health care provider. Ages 65   years and over  Blood pressure check.** / Every 1 to 2 years.  Lipid and cholesterol check.** / Every 5 years beginning at age 22 years.  Lung cancer screening. / Every year if you are aged 73-80 years and have a 30-pack-year history of smoking and currently smoke or have quit within the past 15 years. Yearly screening is stopped once you have quit smoking for at least 15 years or develop a health problem that would prevent you from having lung cancer  treatment.  Clinical breast exam.** / Every year after age 4 years.  BRCA-related cancer risk assessment.** / For women who have family members with a BRCA-related cancer (breast, ovarian, tubal, or peritoneal cancers).  Mammogram.** / Every year beginning at age 40 years and continuing for as long as you are in good health. Consult with your health care provider.  Pap test.** / Every 3 years starting at age 9 years through age 34 or 91 years with 3 consecutive normal Pap tests. Testing can be stopped between 65 and 70 years with 3 consecutive normal Pap tests and no abnormal Pap or HPV tests in the past 10 years.  HPV screening.** / Every 3 years from ages 57 years through ages 64 or 45 years with a history of 3 consecutive normal Pap tests. Testing can be stopped between 65 and 70 years with 3 consecutive normal Pap tests and no abnormal Pap or HPV tests in the past 10 years.  Fecal occult blood test (FOBT) of stool. / Every year beginning at age 15 years and continuing until age 17 years. You may not need to do this test if you get a colonoscopy every 10 years.  Flexible sigmoidoscopy or colonoscopy.** / Every 5 years for a flexible sigmoidoscopy or every 10 years for a colonoscopy beginning at age 86 years and continuing until age 71 years.  Hepatitis C blood test.** / For all people born from [birth year] through 1966 and any individual with known risks for hepatitis C.  Osteoporosis screening.** / A one-time screening for women ages 83 years and over and women at risk for fractures or osteoporosis.  Skin self-exam. / Monthly.  Influenza vaccine. / Every year.  Tetanus, diphtheria, and acellular pertussis (Tdap/Td) vaccine.** / 1 dose of Td every 10 years.  Varicella vaccine.** / Consult your health care provider.  Zoster vaccine.** / 1 dose for adults aged 61 years or older.  Pneumococcal 13-valent conjugate (PCV13) vaccine.** / Consult your health care provider.  Pneumococcal  polysaccharide (PPSV23) vaccine.** / 1 dose for all adults aged 28 years and older.  Meningococcal vaccine.** / Consult your health care provider.  Hepatitis A vaccine.** / Consult your health care provider.  Hepatitis B vaccine.** / Consult your health care provider.  Haemophilus influenzae type b (Hib) vaccine.** / Consult your health care provider. ** Family history and personal history of risk and conditions may change your health care provider's recommendations. Document Released: 11/20/2001 Document Revised: 02/08/2014 Document Reviewed: 02/19/2011 [facility] Patient Information 2015 [place], [place]. This information is not intended to replace advice given to you by your health care provider. Make sure you discuss any questions you have with your health care provider.

## 2014-12-14 NOTE — Assessment & Plan Note (Signed)
Her BP is well controlled Will monitor her lytes and renal function

## 2014-12-14 NOTE — Progress Notes (Signed)
   Subjective:    Patient ID: Alicia Hoover, female    DOB: 03/12/1976, 39 y.o.   MRN: [id number]  Hypertension This is a chronic problem. The current episode started more than 1 year ago. The problem has been gradually improving since onset. The problem is controlled. Pertinent negatives include no anxiety, blurred vision, chest pain, headaches, malaise/fatigue, neck pain, orthopnea, palpitations, peripheral edema, PND, shortness of breath or sweats. Agents associated with hypertension include NSAIDs. Risk factors for coronary artery disease include obesity. Past treatments include ACE inhibitors and diuretics. The current treatment provides significant improvement. There are no compliance problems.  Identifiable causes of hypertension include sleep apnea.      Review of Systems  Constitutional: Negative.  Negative for fever, chills, malaise/fatigue, diaphoresis, appetite change and fatigue.  HENT: Negative.   Eyes: Negative.  Negative for blurred vision.  Respiratory: Positive for apnea. Negative for cough, choking, chest tightness, shortness of breath, wheezing and stridor.   Cardiovascular: Negative.  Negative for chest pain, palpitations, orthopnea, leg swelling and PND.  Gastrointestinal: Negative.  Negative for nausea, vomiting, abdominal pain, diarrhea, constipation and blood in stool.  Endocrine: Negative.   Genitourinary: Negative.   Musculoskeletal: Positive for arthralgias. Negative for back pain, joint swelling and neck pain.  Skin: Negative.  Negative for rash.  Allergic/Immunologic: Negative.   Neurological: Negative.  Negative for dizziness, tremors, syncope, light-headedness and headaches.  Hematological: Negative.  Negative for adenopathy. Does not bruise/bleed easily.  Psychiatric/Behavioral: Negative.        Objective:   Physical Exam  Constitutional: She is oriented to person, place, and time. She appears well-developed and well-nourished. No distress.  HENT:    Head: Normocephalic and atraumatic.  Mouth/Throat: Oropharynx is clear and moist. No oropharyngeal exudate.  Eyes: Conjunctivae are normal. Right eye exhibits no discharge. Left eye exhibits no discharge. No scleral icterus.  Neck: Normal range of motion. Neck supple. No JVD present. No tracheal deviation present. No thyromegaly present.  Cardiovascular: Normal rate, regular rhythm, normal heart sounds and intact distal pulses.  Exam reveals no gallop and no friction rub.   No murmur heard. Pulmonary/Chest: Effort normal and breath sounds normal. No stridor. No respiratory distress. She has no wheezes. She has no rales. She exhibits no tenderness.  Abdominal: Soft. Bowel sounds are normal. She exhibits no distension and no mass. There is no tenderness. There is no rebound and no guarding.  Musculoskeletal: Normal range of motion. She exhibits no edema or tenderness.  Lymphadenopathy:    She has no cervical adenopathy.  Neurological: She is oriented to person, place, and time.  Skin: Skin is warm and dry. No rash noted. She is not diaphoretic. No erythema. No pallor.  Vitals reviewed.    Lab Results  Component Value Date   WBC 8.9 02/24/2014   HGB 12.3 02/24/2014   HCT 38.5 02/24/2014   PLT 374.0 02/24/2014   GLUCOSE 85 02/24/2014   CHOL 127 02/24/2014   TRIG 100.0 02/24/2014   HDL 40.60 02/24/2014   LDLCALC 66 02/24/2014   ALT 23 02/24/2014   AST 19 02/24/2014   NA 138 02/24/2014   K 3.8 02/24/2014   CL 103 02/24/2014   CREATININE 0.9 02/24/2014   BUN 13 02/24/2014   CO2 29 02/24/2014   TSH 1.83 02/24/2014   HGBA1C 6.1 02/24/2014       Assessment & Plan:

## 2014-12-14 NOTE — Progress Notes (Signed)
Pre visit review using our clinic review tool, if applicable. No additional management support is needed unless otherwise documented below in the visit note. 

## 2014-12-14 NOTE — Telephone Encounter (Signed)
Pt wanted to make sure the refill for Benzapril 20-12.5 mg is sent to [facility].

## 2014-12-14 NOTE — Assessment & Plan Note (Signed)
Improvement noted with contrave and lifestyle modifications

## 2014-12-14 NOTE — Assessment & Plan Note (Signed)
Vaccines were reviewed and updated She sees her GYN next week Exam done Labs ordered Pt ed material was given

## 2014-12-15 ENCOUNTER — Encounter: Payer: Self-pay | Admitting: Internal Medicine

## 2015-03-02 ENCOUNTER — Encounter: Payer: Self-pay | Admitting: Internal Medicine

## 2015-03-02 ENCOUNTER — Ambulatory Visit (INDEPENDENT_AMBULATORY_CARE_PROVIDER_SITE_OTHER): Payer: 59 | Admitting: Internal Medicine

## 2015-03-02 VITALS — BP 128/82 | HR 85 | Temp 97.9°F | Resp 14 | Ht 65.0 in | Wt 278.8 lb

## 2015-03-02 DIAGNOSIS — J302 Other seasonal allergic rhinitis: Secondary | ICD-10-CM | POA: Diagnosis not present

## 2015-03-02 MED ORDER — MONTELUKAST SODIUM 10 MG PO TABS: 10.0000 mg | ORAL_TABLET | Freq: Every day | ORAL | Status: DC

## 2015-03-02 MED ORDER — AMOXICILLIN 500 MG PO CAPS: 500.0000 mg | ORAL_CAPSULE | Freq: Three times a day (TID) | ORAL | Status: DC

## 2015-03-02 NOTE — Progress Notes (Signed)
Pre visit review using our clinic review tool, if applicable. No additional management support is needed unless otherwise documented below in the visit note. 

## 2015-03-02 NOTE — Progress Notes (Signed)
   Subjective:    Patient ID: Alicia Hoover, female    DOB: September 09, 1976, 39 y.o.   MRN: [id number]  HPI Her symptoms began 3 weeks ago as her typical seasonal rhinoconjunctivitis. She has this every Spring; it has been more severe this year. The symptoms include severe head and nasal congestion and postnasal drainage with clear secretions. She describes pressure in the frontal and maxillary sinus areas. She has been using Zyrtec and Flonase.  She feels somewhat lightheaded. She's had some sneezing. Cough has produced scant amount of light green sputum.   Review of Systems She denies itchy, watery eyes. She has no fever, chills, or sweats. There's been no dental pain or sore throat. She has no shortness of breath or wheezing with the cough    Objective:   Physical Exam  General appearance:Adequately nourished; no acute distress or increased work of breathing is present.    Lymphatic: No  lymphadenopathy about the head, neck, or axilla .  Eyes: No conjunctival inflammation or lid edema is present. There is no scleral icterus.  Ears:  External ear exam shows no significant lesions or deformities.  Otoscopic examination reveals clear canals, tympanic membranes are intact bilaterally without bulging, retraction, inflammation or discharge.  Nose:  External nasal examination shows no deformity or inflammation. Nasal mucosa are dry and erythematous without lesions or exudates No septal dislocation or deviation.There is significant obstruction to airflow. Hyponasal speech pattern present.  Oral exam: Dental hygiene is good; lips and gums are healthy appearing.There is no oropharyngeal erythema or exudate. Mouth breathing.  Neck:  No deformities, thyromegaly, masses, or tenderness noted.   Supple with full range of motion without pain.   Heart:  Normal rate and regular rhythm. S1 and S2 normal without gallop, murmur, click, rub or other extra sounds.   Lungs:Chest clear to auscultation; no  wheezes, rhonchi,rales ,or rubs present.  Extremities:  No cyanosis, edema, or clubbing  noted    Skin: Warm & dry w/o tenting  No significant lesions or rash.      Assessment & Plan:  #1 allergic rhinitis See orders & AVS

## 2015-03-02 NOTE — Patient Instructions (Signed)
Plain Mucinex (NOT D) for thick secretions ;force NON dairy fluids .   Nasal cleansing in the shower as discussed with lather of mild shampoo.After 10 seconds wash off lather while  exhaling through nostrils. Make sure that all residual soap is removed to prevent irritation.  Flonase OR Nasacort AQ 1 spray in each nostril twice a day as needed. Use the "crossover" technique into opposite nostril spraying toward opposite ear @ 45 degree angle, not straight up into nostril.  Plain Allegra (NOT D )  160 daily , Loratidine 10 mg , OR Zyrtec 10 mg @ bedtime  as needed for itchy eyes & sneezing.  Fill the  prescription for antibiotic if fever, discolored nasal or chest secretions or significant pain above & below eyes appear in the next 48-72 hours.

## 2015-04-28 ENCOUNTER — Other Ambulatory Visit: Payer: Self-pay | Admitting: Emergency Medicine

## 2015-04-28 DIAGNOSIS — J302 Other seasonal allergic rhinitis: Secondary | ICD-10-CM

## 2015-04-28 MED ORDER — MONTELUKAST SODIUM 10 MG PO TABS: 10.0000 mg | ORAL_TABLET | Freq: Every day | ORAL | Status: DC

## 2015-06-24 ENCOUNTER — Other Ambulatory Visit: Payer: Self-pay

## 2015-06-24 DIAGNOSIS — I10 Essential (primary) hypertension: Secondary | ICD-10-CM

## 2015-06-24 MED ORDER — NALTREXONE-BUPROPION HCL ER 8-90 MG PO TB12: 2.0000 | ORAL_TABLET | Freq: Two times a day (BID) | ORAL | Status: DC

## 2015-06-24 MED ORDER — BENAZEPRIL-HYDROCHLOROTHIAZIDE 20-12.5 MG PO TABS: 1.0000 | ORAL_TABLET | Freq: Every day | ORAL | Status: DC

## 2015-08-29 LAB — PROCEDURE REPORT - SCANNED: Pap: NEGATIVE

## 2015-12-02 ENCOUNTER — Other Ambulatory Visit: Payer: Self-pay | Admitting: Internal Medicine

## 2016-02-09 ENCOUNTER — Other Ambulatory Visit: Payer: Self-pay | Admitting: Internal Medicine

## 2016-02-17 ENCOUNTER — Ambulatory Visit (INDEPENDENT_AMBULATORY_CARE_PROVIDER_SITE_OTHER): Payer: 59 | Admitting: Family

## 2016-02-17 ENCOUNTER — Encounter: Payer: Self-pay | Admitting: Family

## 2016-02-17 VITALS — BP 120/72 | HR 68 | Temp 98.3°F | Resp 16 | Ht 65.5 in | Wt 288.0 lb

## 2016-02-17 DIAGNOSIS — R21 Rash and other nonspecific skin eruption: Secondary | ICD-10-CM | POA: Diagnosis not present

## 2016-02-17 MED ORDER — METHYLPREDNISOLONE ACETATE 80 MG/ML IJ SUSP
80.0000 mg | Freq: Once | INTRAMUSCULAR | Status: AC
  Administered 2016-02-17: 80 mg via INTRAMUSCULAR

## 2016-02-17 MED ORDER — PREDNISONE 10 MG (21) PO TBPK: ORAL_TABLET | ORAL | Status: DC

## 2016-02-17 NOTE — Patient Instructions (Signed)
Thank you for choosing [facility].  Summary/Instructions:  Continue to take your medications as prescribed.   Recommend Aveeno products to decrease itching.  If your symptoms do not improve start the prednisone dose pack.  Your prescription(s) have been submitted to your pharmacy or been printed and provided for you. Please take as directed and contact our office if you believe you are having problem(s) with the medication(s) or have any questions.  If your symptoms worsen or fail to improve, please contact our office for further instruction, or in case of emergency go directly to the emergency room at the closest medical facility.

## 2016-02-17 NOTE — Progress Notes (Signed)
Subjective:    Patient ID: Alicia Hoover, female    DOB: [birth year], 40 y.o.   MRN: [id number]  Chief Complaint  Patient presents with  . Acute Visit    arms, ankles, and back is broken out in a rash x 3 weeks - very itchy    HPI:  Alicia Hoover is a 40 y.o. female who  has a past medical history of Asthma; Rhinitis; GERD (gastroesophageal reflux disease); Migraine; Hypersomnia with sleep apnea; and Hypertension. and presents today for an acute office visit.   This is a new problem. Associated symptom of a rash located on her arms, ankles and back has been going on for about 3 weeks. Described as red and very itchy. Denies any changes to soaps, body care products, changes in medications or detergents. Believes it may be related to pollen. Modifying factors include Zyrtec, OTC hydrocortisone and Benedryl have not helped very much. Course of the symptoms has progressively worsened. Denies symptoms of anaphylactics.   Allergies  Allergen Reactions  . Codeine Hives and Nausea And Vomiting     Current Outpatient Prescriptions on File Prior to Visit  Medication Sig Dispense Refill  . benazepril-hydrochlorthiazide (LOTENSIN HCT) 20-12.5 MG per tablet Take 1 tablet by mouth daily. 90 tablet 2  . cetirizine (ZYRTEC) 10 MG tablet Take 10 mg by mouth daily.    . fluticasone (FLONASE) 50 MCG/ACT nasal spray Place 2 sprays into the nose daily as needed for allergies.     . montelukast (SINGULAIR) 10 MG tablet TAKE 1 TABLET (10 MG TOTAL) BY MOUTH AT BEDTIME. 90 tablet 1  . Multiple Vitamin (MULTIVITAMIN) capsule Take 1 capsule by mouth daily.    . Naltrexone-Bupropion HCl ER (CONTRAVE) 8-90 MG TB12 Take 2 tablets by mouth 2 (two) times daily. 360 tablet 1  . norgestimate-ethinyl estradiol (SPRINTEC 28) 0.25-35 MG-MCG per tablet Take 1 tablet by mouth daily.      Marland Kitchen omeprazole (PRILOSEC) 40 MG capsule Take 40 mg by mouth daily.    Marland Kitchen amoxicillin (AMOXIL) 500 MG capsule Take 1 capsule (500 mg  total) by mouth 3 (three) times daily. (Patient not taking: Reported on 02/17/2016) 30 capsule 0   No current facility-administered medications on file prior to visit.    Past Medical History  Diagnosis Date  . Asthma   . Rhinitis   . GERD (gastroesophageal reflux disease)   . Migraine   . Hypersomnia with sleep apnea   . Hypertension      Past Surgical History  Procedure Laterality Date  . Cholecystectomy    . Tooth extraction      Review of Systems  Constitutional: Negative for fever and chills.  Skin: Positive for rash.      Objective:    BP 120/72 mmHg  Pulse 68  Temp(Src) 98.3 F (36.8 C) (Oral)  Resp 16  Ht 5' 5.5" (1.664 m)  Wt 288 lb (130.636 kg)  BMI 47.18 kg/m2  SpO2 96%  LMP 02/17/2016 Nursing note and vital signs reviewed.  Physical Exam  Constitutional: She is oriented to person, place, and time. She appears well-developed and well-nourished. No distress.  Cardiovascular: Normal rate, regular rhythm, normal heart sounds and intact distal pulses.   Pulmonary/Chest: Effort normal and breath sounds normal.  Neurological: She is alert and oriented to person, place, and time.  Skin: Skin is warm and dry. Rash (Diffuse and sporadic) noted. Rash is urticarial.  Psychiatric: She has a normal mood and affect. Her behavior is  normal. Judgment and thought content normal.       Assessment & Plan:   Problem List Items Addressed This Visit      Musculoskeletal and Integument   Rash and nonspecific skin eruption - Primary    Symptoms and exam consistent with allergic reaction to possible pollen or other environmental source. In office of depomedrol given. Written prescription for prednisone provided. Denies symptoms of anaphylaxis. Follow up for symptom worsening.        Relevant Medications   predniSONE (STERAPRED UNI-PAK 21 TAB) 10 MG (21) TBPK tablet       I am having Ms. Brickley start on predniSONE. I am also having her maintain her  norgestimate-ethinyl estradiol, fluticasone, cetirizine, multivitamin, omeprazole, amoxicillin, benazepril-hydrochlorthiazide, Naltrexone-Bupropion HCl ER, and montelukast.   Meds ordered this encounter  Medications  . predniSONE (STERAPRED UNI-PAK 21 TAB) 10 MG (21) TBPK tablet    Sig: Take 6 tablets x 1 day, 5 tablets x 1 day, 4 tablets x 1 day, 3 tablets x 1 day, 2 tablets x 1 day, 1 tablet x 1 day    Dispense:  21 tablet    Refill:  0    Order Specific Question:  Supervising Provider    Answer:  Pricilla Holm A [[id number]]     Follow-up: No Follow-up on file.  Mauricio Po, FNP

## 2016-02-17 NOTE — Progress Notes (Signed)
Pre visit review using our clinic review tool, if applicable. No additional management support is needed unless otherwise documented below in the visit note. 

## 2016-02-17 NOTE — Assessment & Plan Note (Signed)
Symptoms and exam consistent with allergic reaction to possible pollen or other environmental source. In office of depomedrol given. Written prescription for prednisone provided. Denies symptoms of anaphylaxis. Follow up for symptom worsening.

## 2016-03-08 ENCOUNTER — Encounter: Payer: Self-pay | Admitting: Internal Medicine

## 2016-03-08 ENCOUNTER — Other Ambulatory Visit (INDEPENDENT_AMBULATORY_CARE_PROVIDER_SITE_OTHER): Payer: 59

## 2016-03-08 ENCOUNTER — Ambulatory Visit (INDEPENDENT_AMBULATORY_CARE_PROVIDER_SITE_OTHER): Payer: 59 | Admitting: Internal Medicine

## 2016-03-08 VITALS — BP 124/84 | HR 65 | Temp 98.9°F | Resp 16 | Ht 65.5 in | Wt 287.0 lb

## 2016-03-08 DIAGNOSIS — R739 Hyperglycemia, unspecified: Secondary | ICD-10-CM

## 2016-03-08 DIAGNOSIS — R7303 Prediabetes: Secondary | ICD-10-CM | POA: Insufficient documentation

## 2016-03-08 DIAGNOSIS — L200 Besnier's prurigo: Secondary | ICD-10-CM

## 2016-03-08 DIAGNOSIS — I10 Essential (primary) hypertension: Secondary | ICD-10-CM

## 2016-03-08 DIAGNOSIS — Z0000 Encounter for general adult medical examination without abnormal findings: Secondary | ICD-10-CM | POA: Diagnosis not present

## 2016-03-08 DIAGNOSIS — L239 Allergic contact dermatitis, unspecified cause: Secondary | ICD-10-CM

## 2016-03-08 LAB — LIPID PANEL
CHOL/HDL RATIO: 3
CHOLESTEROL: 148 mg/dL (ref 0–200)
HDL: 51.5 mg/dL (ref >39.00)
LDL CALC: 77 mg/dL (ref 0–99)
NonHDL: 96.76
TRIGLYCERIDES: 100 mg/dL (ref 0.0–149.0)
VLDL: 20 mg/dL (ref 0.0–40.0)

## 2016-03-08 LAB — HEMOGLOBIN A1C: HEMOGLOBIN A1C: 5.6 % (ref 4.6–6.5)

## 2016-03-08 LAB — HM PAP SMEAR

## 2016-03-08 LAB — CBC WITH DIFFERENTIAL/PLATELET
BASOS ABS: 0 10*3/uL (ref 0.0–0.1)
BASOS PCT: 0.6 % (ref 0.0–3.0)
EOS ABS: 0.1 10*3/uL (ref 0.0–0.7)
Eosinophils Relative: 0.9 % (ref 0.0–5.0)
HEMATOCRIT: 39.2 % (ref 36.0–46.0)
HEMOGLOBIN: 12.9 g/dL (ref 12.0–15.0)
LYMPHS PCT: 31.1 % (ref 12.0–46.0)
Lymphs Abs: 2.6 10*3/uL (ref 0.7–4.0)
MCHC: 32.9 g/dL (ref 30.0–36.0)
MCV: 78.3 fl (ref 78.0–100.0)
Monocytes Absolute: 0.7 10*3/uL (ref 0.1–1.0)
Monocytes Relative: 7.7 % (ref 3.0–12.0)
Neutro Abs: 5.1 10*3/uL (ref 1.4–7.7)
Neutrophils Relative %: 59.7 % (ref 43.0–77.0)
Platelets: 379 10*3/uL (ref 150.0–400.0)
RBC: 5 Mil/uL (ref 3.87–5.11)
RDW: 14.1 % (ref 11.5–15.5)
WBC: 8.5 10*3/uL (ref 4.0–10.5)

## 2016-03-08 LAB — COMPREHENSIVE METABOLIC PANEL
ALT: 20 U/L (ref 0–35)
AST: 16 U/L (ref 0–37)
Albumin: 4 g/dL (ref 3.5–5.2)
Alkaline Phosphatase: 55 U/L (ref 39–117)
BILIRUBIN TOTAL: 0.3 mg/dL (ref 0.2–1.2)
BUN: 17 mg/dL (ref 6–23)
CALCIUM: 9 mg/dL (ref 8.4–10.5)
CHLORIDE: 102 meq/L (ref 96–112)
CO2: 30 meq/L (ref 19–32)
Creatinine, Ser: 0.92 mg/dL (ref 0.40–1.20)
GFR: 86.49 mL/min (ref >60.00)
Glucose, Bld: 85 mg/dL (ref 70–99)
Potassium: 3.9 mEq/L (ref 3.5–5.1)
Sodium: 138 mEq/L (ref 135–145)
Total Protein: 7 g/dL (ref 6.0–8.3)

## 2016-03-08 LAB — TSH: TSH: 2.55 u[IU]/mL (ref 0.35–4.50)

## 2016-03-08 LAB — HM MAMMOGRAPHY: HM MAMMO: NORMAL (ref 0–4)

## 2016-03-08 MED ORDER — BENAZEPRIL-HYDROCHLOROTHIAZIDE 20-12.5 MG PO TABS: 1.0000 | ORAL_TABLET | Freq: Every day | ORAL | Status: DC

## 2016-03-08 MED ORDER — DOXEPIN HCL 10 MG PO CAPS: 10.0000 mg | ORAL_CAPSULE | Freq: Every day | ORAL | Status: DC

## 2016-03-08 MED ORDER — NALTREXONE-BUPROPION HCL ER 8-90 MG PO TB12: 2.0000 | ORAL_TABLET | Freq: Two times a day (BID) | ORAL | Status: DC

## 2016-03-08 MED ORDER — FLUOCINONIDE-E 0.05 % EX CREA: 1.0000 "application " | TOPICAL_CREAM | Freq: Two times a day (BID) | CUTANEOUS | Status: DC

## 2016-03-08 NOTE — Patient Instructions (Signed)
Eczema Eczema, also called atopic dermatitis, is a skin disorder that causes inflammation of the skin. It causes a red rash and dry, scaly skin. The skin becomes very itchy. Eczema is generally worse during the cooler winter months and often improves with the warmth of summer. Eczema usually starts showing signs in infancy. Some children outgrow eczema, but it may last through adulthood.  CAUSES  The exact cause of eczema is not known, but it appears to run in families. People with eczema often have a family history of eczema, allergies, asthma, or hay fever. Eczema is not contagious. Flare-ups of the condition may be caused by:   Contact with something you are sensitive or allergic to.   Stress. SIGNS AND SYMPTOMS  Dry, scaly skin.   Red, itchy rash.   Itchiness. This may occur before the skin rash and may be very intense.  DIAGNOSIS  The diagnosis of eczema is usually made based on symptoms and medical history. TREATMENT  Eczema cannot be cured, but symptoms usually can be controlled with treatment and other strategies. A treatment plan might include:  Controlling the itching and scratching.   Use over-the-counter antihistamines as directed for itching. This is especially useful at night when the itching tends to be worse.   Use over-the-counter steroid creams as directed for itching.   Avoid scratching. Scratching makes the rash and itching worse. It may also result in a skin infection (impetigo) due to a break in the skin caused by scratching.   Keeping the skin well moisturized with creams every day. This will seal in moisture and help prevent dryness. Lotions that contain alcohol and water should be avoided because they can dry the skin.   Limiting exposure to things that you are sensitive or allergic to (allergens).   Recognizing situations that cause stress.   Developing a plan to manage stress.  HOME CARE INSTRUCTIONS   Only take over-the-counter or  prescription medicines as directed by your health care provider.   Do not use anything on the skin without checking with your health care provider.   Keep baths or showers short (5 minutes) in warm (not hot) water. Use mild cleansers for bathing. These should be unscented. You may add nonperfumed bath oil to the bath water. It is best to avoid soap and bubble bath.   Immediately after a bath or shower, when the skin is still damp, apply a moisturizing ointment to the entire body. This ointment should be a petroleum ointment. This will seal in moisture and help prevent dryness. The thicker the ointment, the better. These should be unscented.   Keep fingernails cut short. Children with eczema may need to wear soft gloves or mittens at night after applying an ointment.   Dress in clothes made of cotton or cotton blends. Dress lightly, because heat increases itching.   A child with eczema should stay away from anyone with fever blisters or cold sores. The virus that causes fever blisters (herpes simplex) can cause a serious skin infection in children with eczema. SEEK MEDICAL CARE IF:   Your itching interferes with sleep.   Your rash gets worse or is not better within 1 week after starting treatment.   You see pus or soft yellow scabs in the rash area.   You have a fever.   You have a rash flare-up after contact with someone who has fever blisters.    This information is not intended to replace advice given to you by your health care  provider. Make sure you discuss any questions you have with your health care provider.   Document Released: 09/21/2000 Document Revised: 07/15/2013 Document Reviewed: 04/27/2013 Elsevier Interactive Patient Education [employer].

## 2016-03-08 NOTE — Progress Notes (Signed)
Subjective:  Patient ID: Alicia Hoover, female    DOB: 1975-11-22  Age: 40 y.o. MRN: [id number]  CC: Hypertension; Annual Exam; and Rash   HPI Alicia Hoover presents for a CPX.  She complains of a rash that has been present on her arms and legs for about a month or 2. She describes it as mostly itchy patches but she does comment that there've been a few whelps. She was seen here a few weeks ago and was given prednisone and Zyrtec and she says that that has helped some. She is not applying anything topically and says the symptoms are still occurring.  She tells me her blood pressures been well controlled with a combination of an ACE inhibitor and hydrochlorothiazide. She denies headache/blurred vision/chest pain/shortness of breath/edema/fatigue.  She is taking Contrave to help her keep weight off. She is tolerating it well.  Outpatient Prescriptions Prior to Visit  Medication Sig Dispense Refill  . fluticasone (FLONASE) 50 MCG/ACT nasal spray Place 2 sprays into the nose daily as needed for allergies.     . montelukast (SINGULAIR) 10 MG tablet TAKE 1 TABLET (10 MG TOTAL) BY MOUTH AT BEDTIME. 90 tablet 1  . Multiple Vitamin (MULTIVITAMIN) capsule Take 1 capsule by mouth daily.    . norgestimate-ethinyl estradiol (SPRINTEC 28) 0.25-35 MG-MCG per tablet Take 1 tablet by mouth daily.      Marland Kitchen omeprazole (PRILOSEC) 40 MG capsule Take 40 mg by mouth daily.    . benazepril-hydrochlorthiazide (LOTENSIN HCT) 20-12.5 MG per tablet Take 1 tablet by mouth daily. 90 tablet 2  . cetirizine (ZYRTEC) 10 MG tablet Take 10 mg by mouth daily.    . Naltrexone-Bupropion HCl ER (CONTRAVE) 8-90 MG TB12 Take 2 tablets by mouth 2 (two) times daily. 360 tablet 1  . amoxicillin (AMOXIL) 500 MG capsule Take 1 capsule (500 mg total) by mouth 3 (three) times daily. (Patient not taking: Reported on 02/17/2016) 30 capsule 0  . predniSONE (STERAPRED UNI-PAK 21 TAB) 10 MG (21) TBPK tablet Take 6 tablets x 1 day, 5  tablets x 1 day, 4 tablets x 1 day, 3 tablets x 1 day, 2 tablets x 1 day, 1 tablet x 1 day 21 tablet 0   No facility-administered medications prior to visit.    ROS Review of Systems  Constitutional: Negative.  Negative for fever, chills, diaphoresis, appetite change and fatigue.  HENT: Negative.  Negative for congestion, facial swelling, sinus pressure, sore throat and trouble swallowing.   Eyes: Negative.  Negative for visual disturbance.  Respiratory: Negative.  Negative for cough, choking, chest tightness, shortness of breath and stridor.   Cardiovascular: Negative.  Negative for chest pain, palpitations and leg swelling.  Gastrointestinal: Negative.  Negative for nausea, vomiting, abdominal pain, diarrhea, constipation and blood in stool.  Endocrine: Negative.   Genitourinary: Negative.   Musculoskeletal: Negative.  Negative for myalgias, back pain, joint swelling, arthralgias and neck pain.  Skin: Positive for rash. Negative for color change, pallor and wound.  Allergic/Immunologic: Negative.   Neurological: Negative.  Negative for dizziness, tremors, seizures, syncope, light-headedness, numbness and headaches.  Hematological: Negative.  Negative for adenopathy. Does not bruise/bleed easily.  Psychiatric/Behavioral: Negative.  Negative for suicidal ideas, self-injury, dysphoric mood and agitation. The patient is not nervous/anxious and is not hyperactive.     Objective:  BP 124/84 mmHg  Pulse 65  Temp(Src) 98.9 F (37.2 C) (Oral)  Resp 16  Ht 5' 5.5" (1.664 m)  Wt 287 lb (130.182 kg)  BMI 47.02 kg/m2  SpO2 96%  LMP 02/17/2016  BP Readings from Last 3 Encounters:  03/08/16 124/84  02/17/16 120/72  03/02/15 128/82    Wt Readings from Last 3 Encounters:  03/08/16 287 lb (130.182 kg)  02/17/16 288 lb (130.636 kg)  03/02/15 278 lb 12 oz (126.44 kg)    Physical Exam  Constitutional: She is oriented to person, place, and time. She appears well-developed and  well-nourished. No distress.  HENT:  Head: Normocephalic and atraumatic.  Mouth/Throat: Oropharynx is clear and moist. No oropharyngeal exudate.  Eyes: Conjunctivae are normal. Right eye exhibits no discharge. Left eye exhibits no discharge. No scleral icterus.  Neck: Normal range of motion. Neck supple. No JVD present. No tracheal deviation present. No thyromegaly present.  Cardiovascular: Normal rate, regular rhythm, normal heart sounds and intact distal pulses.  Exam reveals no gallop and no friction rub.   No murmur heard. Pulmonary/Chest: Effort normal and breath sounds normal. No stridor. No respiratory distress. She has no wheezes. She has no rales. She exhibits no tenderness.  Abdominal: Soft. Bowel sounds are normal. She exhibits no distension and no mass. There is no tenderness. There is no rebound and no guarding.  Musculoskeletal: Normal range of motion. She exhibits no edema or tenderness.  Lymphadenopathy:    She has no cervical adenopathy.  Neurological: She is oriented to person, place, and time.  Skin: Skin is warm and dry. Ecchymosis and rash noted. No abrasion, no bruising, no burn, no laceration, no lesion, no petechiae and no purpura noted. Rash is macular. Rash is not papular, not maculopapular, not nodular, not pustular, not vesicular and not urticarial. She is not diaphoretic. No cyanosis or erythema. No pallor. Nails show no clubbing.  Over both upper extremities, over the proximal surface there are patches of hyperpigmentation and faint scale. I do not see any wheals at this time. Some of these areas appear to be slightly ecchymotic.  Psychiatric: She has a normal mood and affect. Her behavior is normal. Judgment and thought content normal.  Vitals reviewed.   Lab Results  Component Value Date   WBC 8.5 03/08/2016   HGB 12.9 03/08/2016   HCT 39.2 03/08/2016   PLT 379.0 03/08/2016   GLUCOSE 85 03/08/2016   CHOL 148 03/08/2016   TRIG 100.0 03/08/2016   HDL 51.50  03/08/2016   LDLCALC 77 03/08/2016   ALT 20 03/08/2016   AST 16 03/08/2016   NA 138 03/08/2016   K 3.9 03/08/2016   CL 102 03/08/2016   CREATININE 0.92 03/08/2016   BUN 17 03/08/2016   CO2 30 03/08/2016   TSH 2.55 03/08/2016   HGBA1C 5.6 03/08/2016    Mr Lumbar Spine Wo Contrast  11/27/2014  CLINICAL DATA:  Low back pain without sciatica. Right leg pain and weakness EXAM: MRI LUMBAR SPINE WITHOUT CONTRAST TECHNIQUE: Multiplanar, multisequence MR imaging of the lumbar spine was performed. No intravenous contrast was administered. COMPARISON:  None. FINDINGS: Image quality degraded by motion and large patient size Normal lumbar alignment. Negative for fracture or mass. Conus medullaris is normal and terminates at L1-2. L1-2: Negative L2-3:  Negative L3-4: Mild disc and facet degeneration. Negative for disc protrusion or stenosis L4-5:  Normal disc.  Early facet degeneration without stenosis L5-S1: Mild disc and mild facet degeneration. Negative for disc protrusion or stenosis. IMPRESSION: Mild lumbar degenerative change without neural impingement or spinal stenosis. Electronically Signed   By: Franchot Gallo M.D.   On: 11/27/2014 13:23  Assessment & Plan:   Latanya was seen today for hypertension, annual exam and rash.  Diagnoses and all orders for this visit:  Essential hypertension, benign- her blood pressure is well-controlled, electrolytes and renal function are stable. -     benazepril-hydrochlorthiazide (LOTENSIN HCT) 20-12.5 MG tablet; Take 1 tablet by mouth daily.  Routine general medical examination at a health care facility- exam completed, labs ordered and reviewed, Pap smear and mammograms are addressed by her gynecologist, patient education material was given, vaccines were reviewed and updated. -     Lipid panel; Future -     Comprehensive metabolic panel; Future -     CBC with Differential/Platelet; Future -     TSH; Future  Hyperglycemia- she has mild prediabetes, will  continue Contrave to help with weight reduction, she agrees to work on her lifestyle modifications, no medications are needed at this time. -     Naltrexone-Bupropion HCl ER (CONTRAVE) 8-90 MG TB12; Take 2 tablets by mouth 2 (two) times daily. -     Hemoglobin A1c; Future  Morbid obesity due to excess calories (HCC) -     Naltrexone-Bupropion HCl ER (CONTRAVE) 8-90 MG TB12; Take 2 tablets by mouth 2 (two) times daily.  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 -     doxepin (SINEQUAN) 10 MG capsule; Take 1 capsule (10 mg total) by mouth at bedtime. -     fluocinonide-emollient (LIDEX-E) 0.05 % cream; Apply 1 application topically 2 (two) times daily.   I have discontinued Ms. Idris's cetirizine, amoxicillin, and predniSONE. I have also changed her benazepril-hydrochlorthiazide. Additionally, I am having her start on doxepin and fluocinonide-emollient. Lastly, I am having her maintain her norgestimate-ethinyl estradiol, fluticasone, multivitamin, omeprazole, montelukast, and Naltrexone-Bupropion HCl ER.  Meds ordered this encounter  Medications  . Naltrexone-Bupropion HCl ER (CONTRAVE) 8-90 MG TB12    Sig: Take 2 tablets by mouth 2 (two) times daily.    Dispense:  360 tablet    Refill:  1  . benazepril-hydrochlorthiazide (LOTENSIN HCT) 20-12.5 MG tablet    Sig: Take 1 tablet by mouth daily.    Dispense:  90 tablet    Refill:  1  . doxepin (SINEQUAN) 10 MG capsule    Sig: Take 1 capsule (10 mg total) by mouth at bedtime.    Dispense:  90 capsule    Refill:  3  . fluocinonide-emollient (LIDEX-E) 0.05 % cream    Sig: Apply 1 application topically 2 (two)  times daily.    Dispense:  60 g    Refill:  2     Follow-up: Return in about 3 months (around 06/08/2016).  Scarlette Calico, MD

## 2016-03-08 NOTE — Progress Notes (Signed)
Pre visit review using our clinic review tool, if applicable. No additional management support is needed unless otherwise documented below in the visit note. 

## 2016-06-07 ENCOUNTER — Other Ambulatory Visit: Payer: Self-pay | Admitting: Internal Medicine

## 2016-07-01 ENCOUNTER — Encounter: Payer: Self-pay | Admitting: *Deleted

## 2016-09-01 ENCOUNTER — Other Ambulatory Visit: Payer: Self-pay | Admitting: Internal Medicine

## 2016-09-01 DIAGNOSIS — I10 Essential (primary) hypertension: Secondary | ICD-10-CM

## 2016-09-02 ENCOUNTER — Encounter (HOSPITAL_COMMUNITY): Payer: Self-pay | Admitting: *Deleted

## 2016-09-02 ENCOUNTER — Emergency Department (HOSPITAL_COMMUNITY)
Admission: EM | Admit: 2016-09-02 | Discharge: 2016-09-02 | Disposition: A | Discharge status: Home / Self Care | Payer: 59 | Attending: Emergency Medicine | Admitting: Emergency Medicine

## 2016-09-02 ENCOUNTER — Emergency Department (HOSPITAL_COMMUNITY): Payer: 59

## 2016-09-02 DIAGNOSIS — I10 Essential (primary) hypertension: Secondary | ICD-10-CM | POA: Insufficient documentation

## 2016-09-02 DIAGNOSIS — J45909 Unspecified asthma, uncomplicated: Secondary | ICD-10-CM | POA: Insufficient documentation

## 2016-09-02 DIAGNOSIS — S93401A Sprain of unspecified ligament of right ankle, initial encounter: Secondary | ICD-10-CM

## 2016-09-02 DIAGNOSIS — Y999 Unspecified external cause status: Secondary | ICD-10-CM | POA: Diagnosis not present

## 2016-09-02 DIAGNOSIS — S99911A Unspecified injury of right ankle, initial encounter: Secondary | ICD-10-CM | POA: Diagnosis present

## 2016-09-02 DIAGNOSIS — Z87891 Personal history of nicotine dependence: Secondary | ICD-10-CM | POA: Insufficient documentation

## 2016-09-02 DIAGNOSIS — Z79899 Other long term (current) drug therapy: Secondary | ICD-10-CM | POA: Insufficient documentation

## 2016-09-02 DIAGNOSIS — Y9389 Activity, other specified: Secondary | ICD-10-CM | POA: Insufficient documentation

## 2016-09-02 DIAGNOSIS — Y929 Unspecified place or not applicable: Secondary | ICD-10-CM | POA: Diagnosis not present

## 2016-09-02 MED ORDER — HYDROCODONE-ACETAMINOPHEN 5-325 MG PO TABS: 1.0000 | ORAL_TABLET | ORAL | 0 refills | Status: DC | PRN

## 2016-09-02 MED ORDER — IBUPROFEN 600 MG PO TABS: 600.0000 mg | ORAL_TABLET | Freq: Four times a day (QID) | ORAL | 0 refills | Status: DC | PRN

## 2016-09-02 MED ORDER — HYDROCODONE-ACETAMINOPHEN 5-325 MG PO TABS
1.0000 | ORAL_TABLET | Freq: Once | ORAL | Status: AC
  Administered 2016-09-02: 1 via ORAL
  Filled 2016-09-02: qty 1
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
## 2016-09-02 NOTE — ED Notes (Signed)
Paged ortho tech
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
## 2016-09-21 ENCOUNTER — Other Ambulatory Visit (HOSPITAL_COMMUNITY)
Admission: RE | Admit: 2016-09-21 | Discharge: 2016-09-21 | Disposition: A | Discharge status: Home / Self Care | Payer: 59 | Source: Ambulatory Visit | Attending: Nurse Practitioner | Admitting: Nurse Practitioner

## 2016-09-21 ENCOUNTER — Other Ambulatory Visit: Payer: 59

## 2016-09-21 ENCOUNTER — Ambulatory Visit (INDEPENDENT_AMBULATORY_CARE_PROVIDER_SITE_OTHER): Payer: 59 | Admitting: Nurse Practitioner

## 2016-09-21 ENCOUNTER — Encounter: Payer: Self-pay | Admitting: Nurse Practitioner

## 2016-09-21 VITALS — BP 138/78 | HR 77 | Temp 98.3°F | Ht 65.5 in | Wt 305.0 lb

## 2016-09-21 DIAGNOSIS — Z9189 Other specified personal risk factors, not elsewhere classified: Secondary | ICD-10-CM | POA: Diagnosis not present

## 2016-09-21 DIAGNOSIS — B9689 Other specified bacterial agents as the cause of diseases classified elsewhere: Secondary | ICD-10-CM | POA: Diagnosis not present

## 2016-09-21 DIAGNOSIS — N760 Acute vaginitis: Secondary | ICD-10-CM | POA: Diagnosis not present

## 2016-09-21 DIAGNOSIS — N898 Other specified noninflammatory disorders of vagina: Secondary | ICD-10-CM

## 2016-09-21 DIAGNOSIS — R300 Dysuria: Secondary | ICD-10-CM | POA: Diagnosis not present

## 2016-09-21 DIAGNOSIS — Z113 Encounter for screening for infections with a predominantly sexual mode of transmission: Secondary | ICD-10-CM | POA: Insufficient documentation

## 2016-09-21 LAB — POCT URINALYSIS DIPSTICK
GLUCOSE UA: NEGATIVE
Ketones, UA: NEGATIVE
Nitrite, UA: POSITIVE
SPEC GRAV UA: 1.025
Urobilinogen, UA: 1
pH, UA: 6

## 2016-09-21 LAB — WET PREP, GENITAL
Trich, Wet Prep: NONE SEEN — AB
WBC, Wet Prep HPF POC: NONE SEEN — AB
Yeast Wet Prep HPF POC: NONE SEEN — AB

## 2016-09-21 MED ORDER — METRONIDAZOLE 0.75 % VA GEL: 1.0000 | Freq: Every day | VAGINAL | 0 refills | Status: DC

## 2016-09-21 MED ORDER — NITROFURANTOIN MONOHYD MACRO 100 MG PO CAPS: 100.0000 mg | ORAL_CAPSULE | Freq: Two times a day (BID) | ORAL | 0 refills | Status: AC

## 2016-09-21 MED ORDER — PHENAZOPYRIDINE HCL 100 MG PO TABS: 100.0000 mg | ORAL_TABLET | Freq: Three times a day (TID) | ORAL | 0 refills | Status: DC | PRN

## 2016-09-21 NOTE — Patient Instructions (Signed)
Dysuria Introduction Dysuria is pain or discomfort while urinating. The pain or discomfort may be felt in the tube that carries urine out of the bladder (urethra) or in the surrounding tissue of the genitals. The pain may also be felt in the groin area, lower abdomen, and lower back. You may have to urinate frequently or have the sudden feeling that you have to urinate (urgency). Dysuria can affect both men and women, but is more common in women. Dysuria can be caused by many different things, including:  Urinary tract infection in women.  Infection of the kidney or bladder.  Kidney stones or bladder stones.  Certain sexually transmitted infections (STIs), such as chlamydia.  Dehydration.  Inflammation of the vagina.  Use of certain medicines.  Use of certain soaps or scented products that cause irritation. Follow these instructions at home: Watch your dysuria for any changes. The following actions may help to reduce any discomfort you are feeling:  Drink enough fluid to keep your urine clear or pale yellow.  Empty your bladder often. Avoid holding urine for long periods of time.  After a bowel movement or urination, women should cleanse from front to back, using each tissue only once.  Empty your bladder after sexual intercourse.  Take medicines only as directed by your health care provider.  If you were prescribed an antibiotic medicine, finish it all even if you start to feel better.  Avoid caffeine, tea, and alcohol. They can irritate the bladder and make dysuria worse. In men, alcohol may irritate the prostate.  Keep all follow-up visits as directed by your health care provider. This is important.  If you had any tests done to find the cause of dysuria, it is your responsibility to obtain your test results. Ask the lab or department performing the test when and how you will get your results. Talk with your health care provider if you have any questions about your  results. Contact a health care provider if:  You develop pain in your back or sides.  You have a fever.  You have nausea or vomiting.  You have blood in your urine.  You are not urinating as often as you usually do. Get help right away if:  You pain is severe and not relieved with medicines.  You are unable to hold down any fluids.  You or someone else notices a change in your mental function.  You have a rapid heartbeat at rest.  You have shaking or chills.  You feel extremely weak. This information is not intended to replace advice given to you by your health care provider. Make sure you discuss any questions you have with your health care provider. Document Released: 06/22/2004 Document Revised: 03/01/2016 Document Reviewed: 05/20/2014  2017 [employer]
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
## 2016-09-21 NOTE — Progress Notes (Signed)
Pre visit review using our clinic review tool, if applicable. No additional management support is needed unless otherwise documented below in the visit note. 

## 2016-09-21 NOTE — Progress Notes (Signed)
Reviewed with patient in office. See office note

## 2016-09-21 NOTE — Addendum Note (Signed)
Addended by: Wilfred Lacy L on: 09/21/2016 10:36 AM   Modules accepted: Orders

## 2016-09-24 LAB — URINE CULTURE

## 2016-09-26 LAB — URINE CYTOLOGY ANCILLARY ONLY
CHLAMYDIA, DNA PROBE: NEGATIVE
NEISSERIA GONORRHEA: NEGATIVE

## 2016-09-26 NOTE — Progress Notes (Signed)
Normal results, see office note

## 2016-10-04 ENCOUNTER — Encounter: Payer: Self-pay | Admitting: Nurse Practitioner

## 2016-10-10 ENCOUNTER — Ambulatory Visit: Payer: 59 | Admitting: Obstetrics

## 2016-10-30 ENCOUNTER — Ambulatory Visit (INDEPENDENT_AMBULATORY_CARE_PROVIDER_SITE_OTHER): Payer: 59 | Admitting: Obstetrics

## 2016-10-30 ENCOUNTER — Encounter: Payer: Self-pay | Admitting: Obstetrics

## 2016-10-30 VITALS — BP 169/90 | HR 84 | Ht 65.5 in | Wt 299.0 lb

## 2016-10-30 DIAGNOSIS — N946 Dysmenorrhea, unspecified: Secondary | ICD-10-CM

## 2016-10-30 DIAGNOSIS — Z113 Encounter for screening for infections with a predominantly sexual mode of transmission: Secondary | ICD-10-CM

## 2016-10-30 DIAGNOSIS — Z01419 Encounter for gynecological examination (general) (routine) without abnormal findings: Secondary | ICD-10-CM | POA: Diagnosis not present

## 2016-10-30 DIAGNOSIS — Z3041 Encounter for surveillance of contraceptive pills: Secondary | ICD-10-CM

## 2016-10-30 DIAGNOSIS — Z1239 Encounter for other screening for malignant neoplasm of breast: Secondary | ICD-10-CM

## 2016-10-30 DIAGNOSIS — Z0000 Encounter for general adult medical examination without abnormal findings: Secondary | ICD-10-CM | POA: Diagnosis not present

## 2016-10-30 DIAGNOSIS — Z124 Encounter for screening for malignant neoplasm of cervix: Secondary | ICD-10-CM

## 2016-10-30 MED ORDER — NORGESTIMATE-ETH ESTRADIOL 0.25-35 MG-MCG PO TABS: 1.0000 | ORAL_TABLET | Freq: Every day | ORAL | 12 refills | Status: DC

## 2016-10-30 MED ORDER — IBUPROFEN 800 MG PO TABS: 800.0000 mg | ORAL_TABLET | Freq: Three times a day (TID) | ORAL | 5 refills | Status: DC | PRN

## 2016-10-30 NOTE — Progress Notes (Signed)
Subjective:        Alicia Hoover is a 41 y.o. female here for a routine exam.  Current complaints: None.    Personal health questionnaire:  Is patient [name], have a family history of breast and/or ovarian cancer: no Is there a family history of uterine cancer diagnosed at age < 84, gastrointestinal cancer, urinary tract cancer, family member who is a Field seismologist syndrome-associated carrier: no Is the patient overweight and hypertensive, family history of diabetes, personal history of gestational diabetes, preeclampsia or PCOS: no Is patient over 42, have PCOS,  family history of premature CHD under age 71, diabetes, smoke, have hypertension or peripheral artery disease:  no At any time, has a partner hit, kicked or otherwise hurt or frightened you?: no Over the past 2 weeks, have you felt down, depressed or hopeless?: no Over the past 2 weeks, have you felt little interest or pleasure in doing things?:no   Gynecologic History Patient's last menstrual period was 10/29/2016. Contraception: OCP (estrogen/progesterone) Last Pap: 2016. Results were: normal Last mammogram: 2017. Results were: normal  Obstetric History OB History  Gravida Para Term Preterm AB Living  3 3 3     3   SAB TAB Ectopic Multiple Live Births          3    # Outcome Date GA Lbr Len/2nd Weight Sex Delivery Anes PTL Lv  3 Term 07/01/06 [redacted]w[redacted]d  F Vag-Spont EPI  LIV  2 Term 08/31/96 426w0d F Vag-Spont EPI  LIV  1 Term 04/21/90 [id number] Vag-Spont Gen  LIV      Past Medical History:  Diagnosis Date  . Asthma   . GERD (gastroesophageal reflux disease)   . Hypersomnia with sleep apnea   . Hypertension   . Migraine   . Rhinitis     Past Surgical History:  Procedure Laterality Date  . CHOLECYSTECTOMY    . TOOTH EXTRACTION       Current Outpatient Prescriptions:  .  benazepril-hydrochlorthiazide (LOTENSIN HCT) 20-12.5 MG tablet, TAKE 1 TABLET BY MOUTH DAILY., Disp: 90 tablet, Rfl: 1 .   Multiple Vitamin (MULTIVITAMIN) capsule, Take 1 capsule by mouth daily., Disp: , Rfl:  .  Naltrexone-Bupropion HCl ER (CONTRAVE) 8-90 MG TB12, Take 2 tablets by mouth 2 (two) times daily., Disp: 360 tablet, Rfl: 1 .  norgestimate-ethinyl estradiol (SPRINTEC 28) 0.25-35 MG-MCG per tablet, Take 1 tablet by mouth daily.  , Disp: , Rfl:  .  omeprazole (PRILOSEC) 40 MG capsule, Take 40 mg by mouth daily., Disp: , Rfl:  .  doxepin (SINEQUAN) 10 MG capsule, Take 1 capsule (10 mg total) by mouth at bedtime. (Patient not taking: Reported on 10/30/2016), Disp: 90 capsule, Rfl: 3 .  fluocinonide-emollient (LIDEX-E) 0.05 % cream, Apply 1 application topically 2 (two) times daily. (Patient not taking: Reported on 10/30/2016), Disp: 60 g, Rfl: 2 .  fluticasone (FLONASE) 50 MCG/ACT nasal spray, Place 2 sprays into the nose daily as needed for allergies. , Disp: , Rfl:  .  HYDROcodone-acetaminophen (NORCO/VICODIN) 5-325 MG tablet, Take 1-2 tablets by mouth every 4 (four) hours as needed. (Patient not taking: Reported on 10/30/2016), Disp: 20 tablet, Rfl: 0 .  ibuprofen (ADVIL,MOTRIN) 600 MG tablet, Take 1 tablet (600 mg total) by mouth every 6 (six) hours as needed. (Patient not taking: Reported on 10/30/2016), Disp: 30 tablet, Rfl: 0 .  metroNIDAZOLE (METROGEL) 0.75 % vaginal gel, Place 1 Applicatorful vaginally at bedtime. (Patient not taking: Reported  on 10/30/2016), Disp: 70 g, Rfl: 0 .  montelukast (SINGULAIR) 10 MG tablet, TAKE 1 TABLET (10 MG TOTAL) BY MOUTH AT BEDTIME. (Patient not taking: Reported on 10/30/2016), Disp: 90 tablet, Rfl: 1 Allergies  Allergen Reactions  . Codeine Hives and Nausea And Vomiting  . Tramadol     Social History  Substance Use Topics  . Smoking status: Former Smoker    Packs/day: 0.10    Years: 1.00    Types: Cigarettes    Quit date: 10/09/1999  . Smokeless tobacco: Never Used  . Alcohol use Yes     Comment: 1 glass of white wine monthly    Family History  Problem Relation Age  of Onset  . Diabetes Father   . Early death Father   . Diabetes Mother   . COPD Mother   . Rheum arthritis Mother   . Hyperlipidemia Brother   . Thyroid disease Brother   . Alcohol abuse Neg Hx   . Cancer Neg Hx   . Depression Neg Hx   . Drug abuse Neg Hx   . Hearing loss Neg Hx   . Heart disease Neg Hx   . Hypertension Neg Hx   . Stroke Neg Hx       Review of Systems  Constitutional: negative for fatigue and weight loss Respiratory: negative for cough and wheezing Cardiovascular: negative for chest pain, fatigue and palpitations Gastrointestinal: negative for abdominal pain and change in bowel habits Musculoskeletal:negative for myalgias Neurological: negative for gait problems and tremors Behavioral/Psych: negative for abusive relationship, depression Endocrine: negative for temperature intolerance    Genitourinary:negative for abnormal menstrual periods, genital lesions, hot flashes, sexual problems and vaginal discharge Integument/breast: negative for breast lump, breast tenderness, nipple discharge and skin lesion(s)    Objective:       BP (!) 169/90   Pulse 84   Ht 5' 5.5" (1.664 m)   Wt 299 lb (135.6 kg)   LMP 10/29/2016   BMI 49.00 kg/m  General:   alert  Skin:   no rash or abnormalities  Lungs:   clear to auscultation bilaterally  Heart:   regular rate and rhythm, S1, S2 normal, no murmur, click, rub or gallop  Breasts:   normal without suspicious masses, skin or nipple changes or axillary nodes  Abdomen:  normal findings: no organomegaly, soft, non-tender and no hernia  Pelvis:  External genitalia: normal general appearance Urinary system: urethral meatus normal and bladder without fullness, nontender Vaginal: normal without tenderness, induration or masses Cervix: normal appearance Adnexa: normal bimanual exam Uterus: anteverted and non-tender, normal size   Lab Review Urine pregnancy test Labs reviewed yes Radiologic studies reviewed yes  50%  of 20 min visit spent on counseling and coordination of care.    Assessment:    Healthy female exam.    Plan:    Education reviewed: calcium supplements, low fat, low cholesterol diet, safe sex/STD prevention, self breast exams and weight bearing exercise. Contraception: OCP (estrogen/progesterone). Mammogram ordered. Follow up in: 1 year.   No orders of the defined types were placed in this encounter.  Orders Placed This Encounter  Procedures  . Hepatitis B surface antigen  . Hepatitis C antibody  . HIV antibody  . RPR      Patient ID: FAITHANN NATAL, female   DOB: 05-Jun-1976, 41 y.o.   MRN: [id number]

## 2016-10-30 NOTE — Progress Notes (Signed)
Pt presents for annual, STD testing, and pap smear. Pt c/o migraines right before period and dysmenorrhea. She reqs rf on Sprintec.

## 2016-10-31 ENCOUNTER — Emergency Department (HOSPITAL_COMMUNITY)
Admission: EM | Admit: 2016-10-31 | Discharge: 2016-10-31 | Disposition: A | Discharge status: Home / Self Care | Payer: 59 | Attending: Emergency Medicine | Admitting: Emergency Medicine

## 2016-10-31 ENCOUNTER — Encounter (HOSPITAL_COMMUNITY): Payer: Self-pay | Admitting: Emergency Medicine

## 2016-10-31 ENCOUNTER — Telehealth: Payer: Self-pay | Admitting: Internal Medicine

## 2016-10-31 DIAGNOSIS — I10 Essential (primary) hypertension: Secondary | ICD-10-CM | POA: Insufficient documentation

## 2016-10-31 DIAGNOSIS — J45909 Unspecified asthma, uncomplicated: Secondary | ICD-10-CM | POA: Insufficient documentation

## 2016-10-31 DIAGNOSIS — R002 Palpitations: Secondary | ICD-10-CM | POA: Diagnosis present

## 2016-10-31 DIAGNOSIS — Z87891 Personal history of nicotine dependence: Secondary | ICD-10-CM | POA: Diagnosis not present

## 2016-10-31 LAB — BASIC METABOLIC PANEL
Anion gap: 8 (ref 5–15)
BUN: 13 mg/dL (ref 6–20)
CO2: 25 mmol/L (ref 22–32)
Calcium: 8.4 mg/dL — ABNORMAL LOW (ref 8.9–10.3)
Chloride: 102 mmol/L (ref 101–111)
Creatinine, Ser: 0.82 mg/dL (ref 0.44–1.00)
GFR calc Af Amer: >60 mL/min (ref >60)
GFR calc non Af Amer: >60 mL/min (ref >60)
Glucose, Bld: 114 mg/dL — ABNORMAL HIGH (ref 65–99)
Potassium: 4 mmol/L (ref 3.5–5.1)
Sodium: 135 mmol/L (ref 135–145)

## 2016-10-31 LAB — CBC WITH DIFFERENTIAL/PLATELET
Basophils Absolute: 0 10*3/uL (ref 0.0–0.1)
Basophils Relative: 0 %
Eosinophils Absolute: 0.1 10*3/uL (ref 0.0–0.7)
Eosinophils Relative: 1 %
HCT: 38 % (ref 36.0–46.0)
Hemoglobin: 12.4 g/dL (ref 12.0–15.0)
Lymphocytes Relative: 29 %
Lymphs Abs: 2.7 10*3/uL (ref 0.7–4.0)
MCH: 24.9 pg — ABNORMAL LOW (ref 26.0–34.0)
MCHC: 32.6 g/dL (ref 30.0–36.0)
MCV: 76.5 fL — ABNORMAL LOW (ref 78.0–100.0)
Monocytes Absolute: 0.6 10*3/uL (ref 0.1–1.0)
Monocytes Relative: 6 %
Neutro Abs: 6.1 10*3/uL (ref 1.7–7.7)
Neutrophils Relative %: 64 %
Platelets: 337 10*3/uL (ref 150–400)
RBC: 4.97 MIL/uL (ref 3.87–5.11)
RDW: 14.4 % (ref 11.5–15.5)
WBC: 9.5 10*3/uL (ref 4.0–10.5)

## 2016-10-31 LAB — CERVICOVAGINAL ANCILLARY ONLY
BACTERIAL VAGINITIS: POSITIVE — AB
CANDIDA VAGINITIS: NEGATIVE
Chlamydia: NEGATIVE
Neisseria Gonorrhea: NEGATIVE
Trichomonas: NEGATIVE

## 2016-10-31 LAB — HEPATITIS B SURFACE ANTIGEN: Hepatitis B Surface Ag: NEGATIVE

## 2016-10-31 LAB — RPR: RPR Ser Ql: NONREACTIVE

## 2016-10-31 LAB — HIV ANTIBODY (ROUTINE TESTING W REFLEX): HIV Screen 4th Generation wRfx: NONREACTIVE

## 2016-10-31 LAB — HEPATITIS C ANTIBODY: Hep C Virus Ab: <0.1 s/co ratio (ref 0.0–0.9)

## 2016-10-31 NOTE — Telephone Encounter (Signed)
Patient Name: MAIZIE GARNO DOB: Jun 05, 1976 Initial Comment caller states she has flutters in her chest and discomfort Nurse Assessment Nurse: Vallery Sa, RN, [place] Date/Time (Eastern Time): 10/31/2016 12:02:19 PM Confirm and document reason for call. If symptomatic, describe symptoms. ---Caller states she developed skipped heart beats this morning. No chest pain. No severe breathing difficulty. No injury in the past 3 days. No fever. Alert and responsive. Does the patient have any new or worsening symptoms? ---Yes Will a triage be completed? ---Yes Related visit to physician within the last 2 weeks? ---No Does the PT have any chronic conditions? (i.e. diabetes, asthma, etc.) ---Yes List chronic conditions. ---High Blood Pressure, Sleep Apnea Is the patient pregnant or possibly pregnant? (Ask all females between the ages of 48-55) ---No Is this a behavioral health or substance abuse call? ---No Guidelines Guideline Title Affirmed Question Affirmed Notes Heart Rate and Heartbeat Questions Dizziness, lightheadedness, or weakness Final Disposition User Go to ED Now Vallery Sa, RN, [place] Referrals [facility] - ED Disagree/Comply: Comply

## 2016-10-31 NOTE — ED Provider Notes (Signed)
[facility] DEPT Provider Note   CSN: [id number] Arrival date & time: 10/31/16  1221 By signing my name below, I, Georgette Shell, attest that this documentation has been prepared under the direction and in the presence of Virgel Manifold, MD. Electronically Signed: Georgette Shell, ED Scribe. 10/31/16. 2:59 PM.  History   Chief Complaint Chief Complaint  Patient presents with  . chest fluttering   HPI The history is provided by the patient. No language interpreter was used.   HPI Comments: Alicia Hoover is a 41 y.o. female with h/o asthma, GERD, and HTN, who presents to the Emergency Department complaining of sensation of heart palpations onset this morning. Pt states she woke up and noticed her symptoms. She has not tried any OTC medications PTA. No abnormal caffeine use or new medications. Denies h/o similar symptoms. Pt further denies chest pain, shortness of breath, dizziness, light-headedness, or any other associated symptoms. Pt is in no pain at this time.   Past Medical History:  Diagnosis Date  . Asthma   . GERD (gastroesophageal reflux disease)   . Hypersomnia with sleep apnea   . Hypertension   . Migraine   . Rhinitis     Patient Active Problem List   Diagnosis Date Noted  . Hyperglycemia 03/08/2016  . Eczema, allergic 03/08/2016  . Rash and nonspecific skin eruption 02/17/2016  . Allergic rhinitis, cause unspecified 02/24/2014  . Routine general medical examination at a health care facility 02/24/2014  . Essential hypertension, benign 02/24/2014  . Morbid obesity ([place]) 11/01/2010  . HYPERSOMNIA WITH SLEEP APNEA UNSPECIFIED 10/16/2010    Past Surgical History:  Procedure Laterality Date  . CHOLECYSTECTOMY    . TOOTH EXTRACTION      OB History    Gravida Para Term Preterm AB Living   3 3 3     3    SAB TAB Ectopic Multiple Live Births           3       Home Medications    Prior to Admission medications   Medication Sig Start Date End Date Taking?  Authorizing Provider  benazepril-hydrochlorthiazide (LOTENSIN HCT) 20-12.5 MG tablet TAKE 1 TABLET BY MOUTH DAILY. 09/02/16  Yes [name], MD  fluticasone (FLONASE) 50 MCG/ACT nasal spray Place 2 sprays into the nose daily as needed for allergies.    Yes Historical Provider, MD  ibuprofen (ADVIL,MOTRIN) 800 MG tablet Take 1 tablet (800 mg total) by mouth every 8 (eight) hours as needed. 10/30/16  Yes Shelly Bombard, MD  Naltrexone-Bupropion HCl ER (CONTRAVE) 8-90 MG TB12 Take 2 tablets by mouth 2 (two) times daily. 03/08/16  Yes [name], MD  norgestimate-ethinyl estradiol ([place] 28) 0.25-35 MG-MCG tablet Take 1 tablet by mouth daily. 10/30/16  Yes Shelly Bombard, MD  omeprazole (PRILOSEC) 40 MG capsule Take 40 mg by mouth daily.   Yes Historical Provider, MD  doxepin (SINEQUAN) 10 MG capsule Take 1 capsule (10 mg total) by mouth at bedtime. Patient not taking: Reported on 10/30/2016 03/08/16   [name], MD  fluocinonide-emollient (LIDEX-E) 0.05 % cream Apply 1 application topically 2 (two) times daily. Patient not taking: Reported on 10/30/2016 03/08/16   [name], MD  HYDROcodone-acetaminophen (NORCO/VICODIN) 5-325 MG tablet Take 1-2 tablets by mouth every 4 (four) hours as needed. Patient not taking: Reported on 10/30/2016 09/02/16   Delos Haring, PA-C  ibuprofen (ADVIL,MOTRIN) 600 MG tablet Take 1 tablet (600 mg total) by mouth every 6 (six) hours  as needed. Patient not taking: Reported on 10/30/2016 09/02/16   Delos Haring, PA-C  metroNIDAZOLE (METROGEL) 0.75 % vaginal gel Place 1 Applicatorful vaginally at bedtime. Patient not taking: Reported on 10/30/2016 09/21/16   Charlene Brooke Nche, NP  montelukast (SINGULAIR) 10 MG tablet TAKE 1 TABLET (10 MG TOTAL) BY MOUTH AT BEDTIME. Patient not taking: Reported on 10/30/2016 09/02/16   [name], MD    Family History Family History  Problem Relation Age of Onset  . Diabetes Father   . Early death Father   . Diabetes  Mother   . COPD Mother   . Rheum arthritis Mother   . Hyperlipidemia Brother   . Thyroid disease Brother   . Alcohol abuse Neg Hx   . Cancer Neg Hx   . Depression Neg Hx   . Drug abuse Neg Hx   . Hearing loss Neg Hx   . Heart disease Neg Hx   . Hypertension Neg Hx   . Stroke Neg Hx     Social History Social History  Substance Use Topics  . Smoking status: Former Smoker    Packs/day: 0.10    Years: 1.00    Types: Cigarettes    Quit date: 10/09/1999  . Smokeless tobacco: Never Used  . Alcohol use Yes     Comment: 1 glass of white wine monthly     Allergies   Codeine and Tramadol   Review of Systems Review of Systems  Constitutional: Negative for chills and fever.  Respiratory: Negative for shortness of breath.   Cardiovascular: Positive for palpitations. Negative for chest pain.  Neurological: Negative for dizziness and light-headedness.  All other systems reviewed and are negative.  Physical Exam Updated Vital Signs BP 126/78 (BP Location: Left Arm)   Pulse 77   Temp 98.4 F (36.9 C) (Oral)   Resp 20   Ht 5' 6"  (1.676 m)   Wt 299 lb (135.6 kg)   LMP 10/29/2016   SpO2 99%   BMI 48.26 kg/m   Physical Exam  Constitutional: She is oriented to person, place, and time. She appears well-developed and well-nourished. No distress.  HENT:  Head: Normocephalic and atraumatic.  Nose: Nose normal.  Mouth/Throat: Oropharynx is clear and moist. No oropharyngeal exudate.  Eyes: Conjunctivae and EOM are normal. Pupils are equal, round, and reactive to light. No scleral icterus.  Neck: Normal range of motion. Neck supple. No JVD present. No tracheal deviation present. No thyromegaly present.  Cardiovascular: Normal rate, regular rhythm and normal heart sounds.  Exam reveals no gallop and no friction rub.   No murmur heard. Pulmonary/Chest: Effort normal and breath sounds normal. No respiratory distress. She has no wheezes. She exhibits no tenderness.  Abdominal: Soft.  Bowel sounds are normal. She exhibits no distension and no mass. There is no tenderness. There is no rebound and no guarding.  Musculoskeletal: Normal range of motion. She exhibits no edema or tenderness.  Lymphadenopathy:    She has no cervical adenopathy.  Neurological: She is alert and oriented to person, place, and time. No cranial nerve deficit. She exhibits normal muscle tone.  Skin: Skin is warm and dry. No rash noted. No erythema. No pallor.  Nursing note and vitals reviewed.    ED Treatments / Results  DIAGNOSTIC STUDIES: Oxygen Saturation is 99% on RA, normal by my interpretation.    COORDINATION OF CARE: 2:59 PM Discussed treatment plan with pt at bedside and pt agreed to plan.  Labs (all labs ordered are  listed, but only abnormal results are displayed) Labs Reviewed - No data to display  EKG  EKG Interpretation  Date/Time:  Wednesday October 31 2016 12:32:21 EST Ventricular Rate:  82 PR Interval:    QRS Duration: 80 QT Interval:  357 QTC Calculation: 417 R Axis:   69 Text Interpretation:  Sinus rhythm Confirmed by Wilson Singer  MD, Thetis Schwimmer [phone]) on 10/31/2016 2:51:38 PM       Radiology No results found.  Procedures Procedures (including critical care time)  Medications Ordered in ED Medications - No data to display   Initial Impression / Assessment and Plan / ED Course  I have reviewed the triage vital signs and the nursing notes.  Pertinent labs & imaging results that were available during my care of the patient were reviewed by me and considered in my medical decision making (see chart for details).     Final Clinical Impressions(s) / ED Diagnoses   Final diagnoses:  Palpitations    New Prescriptions New Prescriptions   No medications on file   I personally preformed the services scribed in my presence. The recorded information has been reviewed is accurate. Virgel Manifold, MD.     Virgel Manifold, MD 11/12/16 [phone]

## 2016-10-31 NOTE — ED Triage Notes (Signed)
Per pt, states she woke up with her chest "fluttering"-no chest pain, no other complaints

## 2016-11-01 ENCOUNTER — Other Ambulatory Visit: Payer: Self-pay | Admitting: Obstetrics

## 2016-11-01 DIAGNOSIS — B9689 Other specified bacterial agents as the cause of diseases classified elsewhere: Secondary | ICD-10-CM

## 2016-11-01 DIAGNOSIS — N760 Acute vaginitis: Principal | ICD-10-CM

## 2016-11-01 MED ORDER — TINIDAZOLE 500 MG PO TABS: 1000.0000 mg | ORAL_TABLET | Freq: Every day | ORAL | 2 refills | Status: DC

## 2016-11-02 LAB — CYTOLOGY - PAP
Diagnosis: UNDETERMINED — AB
HPV: NOT DETECTED

## 2016-11-16 ENCOUNTER — Ambulatory Visit: Payer: 59

## 2017-03-20 ENCOUNTER — Encounter: Payer: Self-pay | Admitting: Internal Medicine

## 2017-03-20 ENCOUNTER — Other Ambulatory Visit: Payer: Self-pay | Admitting: Internal Medicine

## 2017-03-20 DIAGNOSIS — I10 Essential (primary) hypertension: Secondary | ICD-10-CM

## 2017-03-20 MED ORDER — BENAZEPRIL-HYDROCHLOROTHIAZIDE 20-12.5 MG PO TABS: 1.0000 | ORAL_TABLET | Freq: Every day | ORAL | 0 refills | Status: DC

## 2017-03-27 ENCOUNTER — Other Ambulatory Visit (INDEPENDENT_AMBULATORY_CARE_PROVIDER_SITE_OTHER): Payer: 59

## 2017-03-27 ENCOUNTER — Ambulatory Visit (INDEPENDENT_AMBULATORY_CARE_PROVIDER_SITE_OTHER)
Admission: RE | Admit: 2017-03-27 | Discharge: 2017-03-27 | Disposition: A | Discharge status: Home / Self Care | Payer: 59 | Source: Ambulatory Visit | Attending: Internal Medicine | Admitting: Internal Medicine

## 2017-03-27 ENCOUNTER — Ambulatory Visit (INDEPENDENT_AMBULATORY_CARE_PROVIDER_SITE_OTHER): Payer: 59 | Admitting: Internal Medicine

## 2017-03-27 ENCOUNTER — Encounter: Payer: Self-pay | Admitting: Internal Medicine

## 2017-03-27 VITALS — BP 132/76 | HR 86 | Temp 98.6°F | Resp 16 | Ht 65.0 in | Wt 311.0 lb

## 2017-03-27 DIAGNOSIS — R739 Hyperglycemia, unspecified: Secondary | ICD-10-CM | POA: Diagnosis not present

## 2017-03-27 DIAGNOSIS — Z0000 Encounter for general adult medical examination without abnormal findings: Secondary | ICD-10-CM

## 2017-03-27 DIAGNOSIS — R7989 Other specified abnormal findings of blood chemistry: Secondary | ICD-10-CM

## 2017-03-27 DIAGNOSIS — M1712 Unilateral primary osteoarthritis, left knee: Secondary | ICD-10-CM

## 2017-03-27 DIAGNOSIS — M25562 Pain in left knee: Secondary | ICD-10-CM

## 2017-03-27 DIAGNOSIS — G8929 Other chronic pain: Secondary | ICD-10-CM | POA: Insufficient documentation

## 2017-03-27 DIAGNOSIS — I10 Essential (primary) hypertension: Secondary | ICD-10-CM | POA: Diagnosis not present

## 2017-03-27 DIAGNOSIS — Z1231 Encounter for screening mammogram for malignant neoplasm of breast: Secondary | ICD-10-CM | POA: Insufficient documentation

## 2017-03-27 LAB — TSH: TSH: 1.15 u[IU]/mL (ref 0.35–4.50)

## 2017-03-27 LAB — LIPID PANEL
CHOL/HDL RATIO: 3
CHOLESTEROL: 126 mg/dL (ref 0–200)
HDL: 38.6 mg/dL — ABNORMAL LOW (ref >39.00)
NonHDL: 87.06
TRIGLYCERIDES: 216 mg/dL — AB (ref 0.0–149.0)
VLDL: 43.2 mg/dL — AB (ref 0.0–40.0)

## 2017-03-27 LAB — COMPREHENSIVE METABOLIC PANEL
ALBUMIN: 3.9 g/dL (ref 3.5–5.2)
ALT: 16 U/L (ref 0–35)
AST: 16 U/L (ref 0–37)
Alkaline Phosphatase: 59 U/L (ref 39–117)
BILIRUBIN TOTAL: 0.2 mg/dL (ref 0.2–1.2)
BUN: 15 mg/dL (ref 6–23)
CO2: 29 mEq/L (ref 19–32)
CREATININE: 0.8 mg/dL (ref 0.40–1.20)
Calcium: 9.1 mg/dL (ref 8.4–10.5)
Chloride: 103 mEq/L (ref 96–112)
GFR: 101.11 mL/min (ref >60.00)
Glucose, Bld: 94 mg/dL (ref 70–99)
Potassium: 4.1 mEq/L (ref 3.5–5.1)
SODIUM: 137 meq/L (ref 135–145)
TOTAL PROTEIN: 6.6 g/dL (ref 6.0–8.3)

## 2017-03-27 LAB — CBC WITH DIFFERENTIAL/PLATELET
BASOS ABS: 0 10*3/uL (ref 0.0–0.1)
Basophils Relative: 0.4 % (ref 0.0–3.0)
EOS ABS: 0.2 10*3/uL (ref 0.0–0.7)
Eosinophils Relative: 1.8 % (ref 0.0–5.0)
HCT: 37.4 % (ref 36.0–46.0)
HEMOGLOBIN: 12.1 g/dL (ref 12.0–15.0)
Lymphocytes Relative: 30.6 % (ref 12.0–46.0)
Lymphs Abs: 3.5 10*3/uL (ref 0.7–4.0)
MCHC: 32.3 g/dL (ref 30.0–36.0)
MCV: 76.4 fl — ABNORMAL LOW (ref 78.0–100.0)
MONO ABS: 0.9 10*3/uL (ref 0.1–1.0)
Monocytes Relative: 7.8 % (ref 3.0–12.0)
Neutro Abs: 6.8 10*3/uL (ref 1.4–7.7)
Neutrophils Relative %: 59.4 % (ref 43.0–77.0)
Platelets: 371 10*3/uL (ref 150.0–400.0)
RBC: 4.89 Mil/uL (ref 3.87–5.11)
RDW: 14.8 % (ref 11.5–15.5)
WBC: 11.5 10*3/uL — AB (ref 4.0–10.5)

## 2017-03-27 LAB — HEMOGLOBIN A1C: Hgb A1c MFr Bld: 6.1 % (ref 4.6–6.5)

## 2017-03-27 LAB — LDL CHOLESTEROL, DIRECT: LDL DIRECT: 57 mg/dL

## 2017-03-27 NOTE — Patient Instructions (Signed)
Knee Pain, Adult Many things can cause knee pain. The pain often goes away on its own with time and rest. If the pain does not go away, tests may be done to find out what is causing the pain. Follow these instructions at home: Activity  Rest your knee.  Do not do things that cause pain.  Avoid activities where both feet leave the ground at the same time (high-impact activities). Examples are running, jumping rope, and doing jumping jacks. General instructions  Take medicines only as told by your doctor.  Raise (elevate) your knee when you are resting. Make sure your knee is higher than your heart.  Sleep with a pillow under your knee.  If told, put ice on the knee: ? Put ice in a plastic bag. ? Place a towel between your skin and the bag. ? Leave the ice on for 20 minutes, 2-3 times a day.  Ask your doctor if you should wear an elastic knee support.  Lose weight if you are overweight. Being overweight can make your knee hurt more.  Do not use any tobacco products. These include cigarettes, chewing tobacco, or electronic cigarettes. If you need help quitting, ask your doctor. Smoking may slow down healing. Contact a doctor if:  The pain does not stop.  The pain changes or gets worse.  You have a fever along with knee pain.  Your knee gives out or locks up.  Your knee swells, and becomes worse. Get help right away if:  Your knee feels warm.  You cannot move your knee.  You have very bad knee pain.  You have chest pain.  You have trouble breathing. Summary  Many things can cause knee pain. The pain often goes away on its own with time and rest.  Avoid activities that put stress on your knee. These include running and jumping rope.  Get help right away if you cannot move your knee, or if your knee feels warm, or if you have trouble breathing. This information is not intended to replace advice given to you by your health care provider. Make sure you discuss any  questions you have with your health care provider. Document Released: 12/21/2008 Document Revised: 09/18/2016 Document Reviewed: 09/18/2016 Elsevier Interactive Patient Education  2017 [employer].

## 2017-03-27 NOTE — Progress Notes (Signed)
Subjective:  Patient ID: Alicia Hoover, female    DOB: [birth year]  Age: 41 y.o. MRN: [id number]  CC: Hypertension; Annual Exam; and Osteoarthritis   HPI SEVANA GRANDINETTI presents for a CPX - She complains of a 6 week history of pain in her left knee with activity. She denies trauma or injury. She does hear grinding in her knee but has not noticed popping or clicking. She's not noticed any instability or buckling. She takes ibuprofen for symptom relief.  Outpatient Medications Prior to Visit  Medication Sig Dispense Refill  . APPLE CIDER VINEGAR PO Take 1 capsule by mouth daily.    Marland Kitchen BIOTIN PO Take 1 capsule by mouth daily.    . fluticasone (FLONASE) 50 MCG/ACT nasal spray Place 2 sprays into the nose daily as needed for allergies.     Marland Kitchen ibuprofen (ADVIL,MOTRIN) 800 MG tablet Take 1 tablet (800 mg total) by mouth every 8 (eight) hours as needed. 30 tablet 5  . norgestimate-ethinyl estradiol (SPRINTEC 28) 0.25-35 MG-MCG tablet Take 1 tablet by mouth daily. 1 Package 12  . omeprazole (PRILOSEC) 40 MG capsule Take 40 mg by mouth daily.    . benazepril-hydrochlorthiazide (LOTENSIN HCT) 20-12.5 MG tablet Take 1 tablet by mouth daily. Must keep June 29th appt for future refills 30 tablet 0  . doxepin (SINEQUAN) 10 MG capsule Take 1 capsule (10 mg total) by mouth at bedtime. (Patient not taking: Reported on 10/30/2016) 90 capsule 3  . montelukast (SINGULAIR) 10 MG tablet TAKE 1 TABLET (10 MG TOTAL) BY MOUTH AT BEDTIME. (Patient not taking: Reported on 10/30/2016) 90 tablet 1  . Naltrexone-Bupropion HCl ER (CONTRAVE) 8-90 MG TB12 Take 2 tablets by mouth 2 (two) times daily. 360 tablet 1  . tinidazole (TINDAMAX) 500 MG tablet Take 2 tablets (1,000 mg total) by mouth daily with breakfast. 10 tablet 2   No facility-administered medications prior to visit.     ROS Review of Systems  Constitutional: Negative.  Negative for chills, diaphoresis, fatigue, fever and unexpected weight change.  HENT:  Negative.  Negative for trouble swallowing.   Eyes: Negative.   Respiratory: Negative.  Negative for apnea, cough, chest tightness, shortness of breath, wheezing and stridor.   Cardiovascular: Negative for chest pain, palpitations and leg swelling.  Gastrointestinal: Negative for abdominal pain, constipation, diarrhea, nausea and vomiting.  Endocrine: Negative.   Genitourinary: Negative.  Negative for difficulty urinating.  Musculoskeletal: Positive for arthralgias. Negative for back pain, joint swelling and myalgias.  Skin: Negative.  Negative for rash.  Allergic/Immunologic: Negative.   Neurological: Negative.  Negative for dizziness, weakness, numbness and headaches.  Hematological: Negative for adenopathy. Does not bruise/bleed easily.  Psychiatric/Behavioral: Negative.     Objective:  BP 110/80 (BP Location: Left Arm, Patient Position: Sitting, Cuff Size: Large)   Pulse 86   Temp 98.6 F (37 C) (Oral)   Ht 5' 5"  (1.651 m)   Wt (!) 311 lb (141.1 kg)   LMP 03/10/2017 (Approximate)   SpO2 100%   BMI 51.75 kg/m   BP Readings from Last 3 Encounters:  03/27/17 110/80  10/31/16 135/90  10/30/16 (!) 169/90    Wt Readings from Last 3 Encounters:  03/27/17 (!) 311 lb (141.1 kg)  10/31/16 299 lb (135.6 kg)  10/30/16 299 lb (135.6 kg)    Physical Exam  Constitutional: She is oriented to person, place, and time. No distress.  HENT:  Mouth/Throat: Oropharynx is clear and moist. No oropharyngeal exudate.  Eyes: Conjunctivae are normal.  Right eye exhibits no discharge. No scleral icterus.  Neck: Normal range of motion. Neck supple. No JVD present. No thyromegaly present.  Cardiovascular: Normal rate, regular rhythm and intact distal pulses.  Exam reveals no gallop.   No murmur heard. Pulmonary/Chest: Effort normal and breath sounds normal. No respiratory distress. She has no wheezes. She has no rales. She exhibits no tenderness.  Abdominal: Soft. Bowel sounds are normal. She  exhibits no distension and no mass. There is no tenderness. There is no rebound and no guarding.  Musculoskeletal: Normal range of motion. She exhibits no edema or tenderness.       Left knee: She exhibits deformity (DJD). She exhibits normal range of motion, no swelling, no effusion, no erythema, normal alignment, no LCL laxity, no bony tenderness and no MCL laxity. No tenderness found.  She has mild valgus deformity in both lower extremities  Lymphadenopathy:    She has no cervical adenopathy.  Neurological: She is alert and oriented to person, place, and time.  Skin: Skin is warm and dry. No rash noted. She is not diaphoretic. No erythema. No pallor.  Vitals reviewed.   Lab Results  Component Value Date   WBC 11.5 (H) 03/27/2017   HGB 12.1 03/27/2017   HCT 37.4 03/27/2017   PLT 371.0 03/27/2017   GLUCOSE 94 03/27/2017   CHOL 126 03/27/2017   TRIG 216.0 (H) 03/27/2017   HDL 38.60 (L) 03/27/2017   LDLDIRECT 57.0 03/27/2017   LDLCALC 77 03/08/2016   ALT 16 03/27/2017   AST 16 03/27/2017   NA 137 03/27/2017   K 4.1 03/27/2017   CL 103 03/27/2017   CREATININE 0.80 03/27/2017   BUN 15 03/27/2017   CO2 29 03/27/2017   TSH 1.15 03/27/2017   HGBA1C 6.1 03/27/2017    No results found.  Assessment & Plan:   Rebakah was seen today for hypertension, annual exam and osteoarthritis.  Diagnoses and all orders for this visit:  Visit for screening mammogram -     MM DIGITAL SCREENING BILATERAL; Future  Hyperglycemia- her A1c is up to 6.1%, she is prediabetic, she agrees to work on her lifestyle modifications. -     Hemoglobin A1c; Future  Essential hypertension, benign- her blood pressure is well-controlled, electrolytes and renal function are normal.  Routine general medical examination at a health care facility- exam completed, labs ordered and reviewed, vaccines reviewed, her Pap is up-to-date, she was referred for mammogram, patient education material was given. -      Comprehensive metabolic panel; Future -     CBC with Differential/Platelet; Future -     Lipid panel; Future -     TSH; Future  Chronic pain of left knee -     DG Knee Complete 4 Views Left; Future -     Ambulatory referral to Physical Therapy  Primary osteoarthritis of left knee- exam and x-rays are consistent with DJD, I've asked her to start physical therapy to treat this. -     Ambulatory referral to Physical Therapy   I have discontinued Ms. Mewborn's Naltrexone-Bupropion HCl ER, doxepin, montelukast, and tinidazole. I am also having her maintain her fluticasone, omeprazole, norgestimate-ethinyl estradiol, ibuprofen, BIOTIN PO, and APPLE CIDER VINEGAR PO.  No orders of the defined types were placed in this encounter.    Follow-up: Return in about 3 months (around 06/27/2017).  Scarlette Calico, MD

## 2017-03-28 ENCOUNTER — Encounter: Payer: Self-pay | Admitting: Internal Medicine

## 2017-03-28 ENCOUNTER — Other Ambulatory Visit: Payer: Self-pay | Admitting: Internal Medicine

## 2017-03-28 DIAGNOSIS — I10 Essential (primary) hypertension: Secondary | ICD-10-CM

## 2017-03-28 DIAGNOSIS — M1712 Unilateral primary osteoarthritis, left knee: Secondary | ICD-10-CM | POA: Insufficient documentation

## 2017-03-28 MED ORDER — BENAZEPRIL-HYDROCHLOROTHIAZIDE 20-12.5 MG PO TABS: 1.0000 | ORAL_TABLET | Freq: Every day | ORAL | 3 refills | Status: DC

## 2017-04-02 ENCOUNTER — Encounter: Payer: Self-pay | Admitting: Internal Medicine

## 2017-04-02 ENCOUNTER — Encounter: Payer: Self-pay | Admitting: Physical Therapy

## 2017-04-02 ENCOUNTER — Ambulatory Visit: Payer: 59 | Attending: Internal Medicine | Admitting: Physical Therapy

## 2017-04-02 DIAGNOSIS — M25562 Pain in left knee: Principal | ICD-10-CM

## 2017-04-02 DIAGNOSIS — R262 Difficulty in walking, not elsewhere classified: Secondary | ICD-10-CM | POA: Insufficient documentation

## 2017-04-02 DIAGNOSIS — G8929 Other chronic pain: Secondary | ICD-10-CM

## 2017-04-02 DIAGNOSIS — M25561 Pain in right knee: Principal | ICD-10-CM

## 2017-04-02 NOTE — Therapy (Addendum)
[place], [place], [address] Phone: [phone]   Fax:  [phone]  Physical Therapy Evaluation  Patient Details  Name: Alicia Hoover MRN: [id number] Date of Birth: 02/07/1976 Referring Provider: [name], MD  Encounter Date: 04/02/2017      PT End of Session - 04/02/17 1539    Visit Number 1   Number of Visits 13   Date for PT Re-Evaluation 05/17/17   Authorization Type [employer]- 30 visit limit   PT Start Time 1540   PT Stop Time 1637   PT Time Calculation (min) 57 min   Activity Tolerance Patient tolerated treatment well   Behavior During Therapy [facility] for tasks assessed/performed      Past Medical History:  Diagnosis Date  . Asthma   . GERD (gastroesophageal reflux disease)   . Hypersomnia with sleep apnea   . Hypertension   . Migraine   . Rhinitis     Past Surgical History:  Procedure Laterality Date  . CHOLECYSTECTOMY    . TOOTH EXTRACTION      There were no vitals filed for this visit.       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 Can feel the swelling, severe tightness when she stands. Has to walk slowly to get knee moving. Popping noted when sitting and sleeping-makes her feel like she is going to pass out from the pain. Insidious onset after she had been walking with her teammates at work. Tries to stay moving at home to avoid stiffness after being seated. Split level home with steps. No improvements with ice and pain patches. Swelling noted in leg and foot below knee.    Patient Stated Goals get up from chair, stairs   Currently in Pain? No/denies   Aggravating Factors  sitting still   Pain Relieving Factors keep moving            [facility] PT Assessment - 04/02/17 0001      Assessment   Medical Diagnosis Chronic pain L knee   Referring Provider [name], MD   Onset Date/Surgical Date --  approx 2 mo ago    Hand Dominance Right   Next MD Visit PRN   Prior Therapy no     Precautions   Precautions None     Restrictions   Weight Bearing Restrictions No     Balance Screen   Has the patient fallen in the past 6 months No     [place] residence   Living Arrangements Children;Other relatives   Additional Comments split level home with stairs     Prior Function   Level of Independence Independent     Cognition   Overall Cognitive Status Within Functional Limits for tasks assessed     Observation/Other Assessments   Focus on Therapeutic Outcomes (FOTO)  72% limitation (goal 42% limitation)     Observation/Other Assessments-Edema    Edema Circumferential  L 50.5 R 49 cm, edem noted below knee to ankle     Sensation   Additional Comments [facility]     Posture/Postural Control   Posture Comments bilateral knee valgus and hyperextension     ROM / Strength   AROM / PROM / Strength AROM;Strength     AROM   Overall AROM Comments R 0-118   AROM Assessment Site Knee   Right/Left Knee Left   Left Knee Extension 0  Left Knee Flexion 114     Strength   Strength Assessment Site Knee;Hip   Right/Left Hip Right;Left   Left Hip Flexion 4+/5   Left Hip ABduction 4+/5   Right/Left Knee Left   Left Knee Flexion 5/5     Palpation   Patella mobility WFL   Palpation comment L lower leg tension due to edema            Objective measurements completed on examination: See above findings.          [facility] Adult PT Treatment/Exercise - 04/02/17 0001      Exercises   Exercises Knee/Hip;Ankle     Knee/Hip Exercises: Stretches   Passive Hamstring Stretch Limitations seated edge of bed     Knee/Hip Exercises: Seated   Other Seated Knee/Hip Exercises knee AROM flx/ext     Modalities   Modalities Vasopneumatic     Vasopneumatic   Number Minutes Vasopneumatic  15 minutes   Vasopnuematic Location  Knee   Vasopneumatic Pressure Low    Vasopneumatic Temperature  38 deg     Ankle Exercises: Seated   Towel Crunch Limitations bilateral feet   Heel Raises Limitations seated, bilateral   Other Seated Ankle Exercises toe yoga                PT Education - 04/02/17 1626    Education provided Yes   Education Details anatomy of condition, POC, HEP, exercise form/rationale   Person(s) Educated Patient   Methods Explanation;Demonstration;Tactile cues;Verbal cues;Handout   Comprehension Verbalized understanding;Returned demonstration;Verbal cues required;Tactile cues required;Need further instruction          PT Short Term Goals - 04/02/17 1713      PT SHORT TERM GOAL #1   Title Pt will verbalize use of RICE and compression stockings for edema control while at work by 7/20   Baseline began educating at eval   Time 3   Period Weeks   Status New     PT SHORT TERM GOAL #2   Title Pt will verbalize ability to incorporate exercises into day to decrease stiffness when standing   Baseline began educating at eval   Time 3   Period Weeks   Status New           PT Long Term Goals - 04/02/17 1716      PT LONG TERM GOAL #1   Title FOTO to 42% limitation to indicate significant improvement in functiona ability by 8/10   Baseline 72% limitaiton at eval   Time 6   Period Weeks   Status New     PT LONG TERM GOAL #2   Title Pt will be able to stand from her chair and begin walking without the need to pause to improve functional mobility   Baseline takes a while due to pain at eval   Time 6   Period Weeks   Status New     PT LONG TERM GOAL #3   Title Pt will be able to navigate stairs at home without limitation from knee   Baseline mild-mod limitation at eval   Time 6   Period Weeks   Status New     PT LONG TERM GOAL #4   Title Pt will be able to return to walking program for weight loss   Baseline has stopped due to fear of knee pain   Time 6   Period Weeks   Status New  Plan - 04/02/17 1628    Clinical Impression Statement Pt presents to PT with complaints of L knee pain of insidious onset htat began 2 months ago. Notable edema in knee and below into foot. Pt with notable knee valgus and hyperextension placing excessive strain on hamstrings and inappropraite stress through knee joint. Pt will benefit from skilled PT in order to improve biomechanical chain activaiton of lower extremity to decrease pain.    History and Personal Factors relevant to plan of care: weight   Clinical Presentation Stable   Clinical Presentation due to: n/a   Clinical Decision Making Low   Rehab Potential Good   PT Frequency 2x / week   PT Duration 6 weeks   PT Treatment/Interventions ADLs/Self Care Home Management;Cryotherapy;Electrical Stimulation;Iontophoresis 90m/ml Dexamethasone;Functional mobility training;Stair training;Gait training;Ultrasound;Traction;Therapeutic activities;Moist Heat;Therapeutic exercise;Balance training;Neuromuscular re-education;Patient/family education;Passive range of motion;Manual techniques;Dry needling;Taping;Vasopneumatic Device   PT Next Visit Plan hamstring & hip abductor strength   PT Home Exercise Plan toe yoga, toe scrunches, heel/toe raise, LAQ, knee flexion, seated HSS;    Consulted and Agree with Plan of Care Patient      Patient will benefit from skilled therapeutic intervention in order to improve the following deficits and impairments:  Difficulty walking, Increased muscle spasms, Decreased activity tolerance, Pain, Improper body mechanics, Impaired flexibility, Hypomobility, Decreased strength, Increased edema, Postural dysfunction  Visit Diagnosis: Acute pain of left knee - Plan: PT plan of care cert/re-cert  Difficulty in walking, not elsewhere classified - Plan: PT plan of care cert/re-cert     Problem List Patient Active Problem List   Diagnosis Date Noted  . Primary osteoarthritis of left knee 03/28/2017  . Visit for  screening mammogram 03/27/2017  . Chronic pain of left knee 03/27/2017  . Hyperglycemia 03/08/2016  . Eczema, allergic 03/08/2016  . Allergic rhinitis, cause unspecified 02/24/2014  . Routine general medical examination at a health care facility 02/24/2014  . Essential hypertension, benign 02/24/2014  . [place]bid [place] ([place] 11/01/2010  . HYPERSOMNIA WITH SLEEP APNEA UNSPECIFIED 10/16/2010    Dequavion Follette C. Blain Hunsucker PT, DPT 04/03/17 7:55 AM   [facility] [address]Phone: [phone]  Fax:  [phone] Name: TJAMIRRA CURNOWMRN: [id number] of Birth: 509-24-1977

## 2017-04-03 ENCOUNTER — Encounter: Payer: Self-pay | Admitting: Physical Therapy

## 2017-04-04 ENCOUNTER — Encounter: Payer: Self-pay | Admitting: Internal Medicine

## 2017-04-04 ENCOUNTER — Ambulatory Visit (INDEPENDENT_AMBULATORY_CARE_PROVIDER_SITE_OTHER): Payer: 59 | Admitting: Internal Medicine

## 2017-04-04 DIAGNOSIS — I10 Essential (primary) hypertension: Secondary | ICD-10-CM

## 2017-04-04 DIAGNOSIS — J019 Acute sinusitis, unspecified: Secondary | ICD-10-CM | POA: Diagnosis not present

## 2017-04-04 DIAGNOSIS — R739 Hyperglycemia, unspecified: Secondary | ICD-10-CM

## 2017-04-04 DIAGNOSIS — J329 Chronic sinusitis, unspecified: Secondary | ICD-10-CM | POA: Insufficient documentation

## 2017-04-04 MED ORDER — ONDANSETRON HCL 4 MG PO TABS: 4.0000 mg | ORAL_TABLET | Freq: Three times a day (TID) | ORAL | 0 refills | Status: DC | PRN

## 2017-04-04 MED ORDER — LEVOFLOXACIN 500 MG PO TABS: 500.0000 mg | ORAL_TABLET | Freq: Every day | ORAL | 0 refills | Status: AC

## 2017-04-04 MED ORDER — HYDROCODONE-HOMATROPINE 5-1.5 MG/5ML PO SYRP: 5.0000 mL | ORAL_SOLUTION | Freq: Four times a day (QID) | ORAL | 0 refills | Status: AC | PRN

## 2017-04-04 NOTE — Patient Instructions (Addendum)
Please take all new medication as prescribed - the antibiotic, and cough medicine if needed, as well as the nausea medication  Please continue all other medications as before, and refills have been done if requested.  Please have the pharmacy call with any other refills you may need.  Please keep your appointments with your specialists as you may have planned

## 2017-04-04 NOTE — Progress Notes (Signed)
   Subjective:    Patient ID: Alicia Hoover, female    DOB: 02/04/1976, 41 y.o.   MRN: [id number]  HPI   Here with 2-3 days acute onset fever, facial pain, pressure, headache, general weakness and malaise, and greenish d/c, with mild ST and cough, but pt denies chest pain, wheezing, increased sob or doe, orthopnea, PND, increased LE swelling, palpitations, dizziness or syncope.   Pt denies polydipsia, polyuria,   Pt states overall good compliance with meds.  Pt denies new neurological symptoms such as new headache, or facial or extremity weakness or numbness   ./pchx Current Outpatient Prescriptions on File Prior to Visit  Medication Sig Dispense Refill  . APPLE CIDER VINEGAR PO Take 1 capsule by mouth daily.    . benazepril-hydrochlorthiazide (LOTENSIN HCT) 20-12.5 MG tablet Take 1 tablet by mouth daily. 90 tablet 3  . BIOTIN PO Take 1 capsule by mouth daily.    . fluticasone (FLONASE) 50 MCG/ACT nasal spray Place 2 sprays into the nose daily as needed for allergies.     Marland Kitchen ibuprofen (ADVIL,MOTRIN) 800 MG tablet Take 1 tablet (800 mg total) by mouth every 8 (eight) hours as needed. 30 tablet 5  . Multiple Vitamin (MULTIVITAMIN) tablet Take 1 tablet by mouth daily.    . Naltrexone-Bupropion HCl ER (CONTRAVE) 8-90 MG TB12 Take by mouth.    . norgestimate-ethinyl estradiol (SPRINTEC 28) 0.25-35 MG-MCG tablet Take 1 tablet by mouth daily. 1 Package 12  . Omega-3 Fatty Acids (FISH OIL) 1360 MG CAPS Take 1,500 mg by mouth.    Marland Kitchen omeprazole (PRILOSEC) 40 MG capsule Take 40 mg by mouth daily.     No current facility-administered medications on file prior to visit.    Review of Systems  Constitutional: Negative for other unusual diaphoresis or sweats HENT: Negative for ear discharge or swelling Eyes: Negative for other worsening visual disturbances Respiratory: Negative for stridor or other swelling  Gastrointestinal: Negative for worsening distension or other blood Genitourinary: Negative for  retention or other urinary change Musculoskeletal: Negative for other MSK pain or swelling Skin: Negative for color change or other new lesions Neurological: Negative for worsening tremors and other numbness  Psychiatric/Behavioral: Negative for worsening agitation or other fatigue All other system neg per pt    Objective:   Physical Exam BP 136/84   Pulse 77   Ht 5' 5"  (1.651 m)   Wt (!) 307 lb (139.3 kg)   LMP 03/10/2017 (Approximate)   SpO2 99%   BMI 51.09 kg/m  VS noted, mild ill Constitutional: Pt appears in NAD HENT: Head: NCAT.  Right Ear: External ear normal.  Left Ear: External ear normal.  Eyes: . Pupils are equal, round, and reactive to light. Conjunctivae and EOM are normal Nose: without d/c or deformity Bilat tm's with mild erythema.  Max sinus areas mild tender.  Pharynx with mild erythema, no exudate Neck: Neck supple. Gross normal ROM Cardiovascular: Normal rate and regular rhythm.   Pulmonary/Chest: Effort normal and breath sounds without rales or wheezing.  Neurological: Pt is alert. At baseline orientation, motor grossly intact Skin: Skin is warm. No rashes, other new lesions, no LE edema Psychiatric: Pt behavior is normal without agitation  No other exam findings    Assessment & Plan:

## 2017-04-07 NOTE — Assessment & Plan Note (Signed)
stable overall by history and exam, recent data reviewed with pt, and pt to continue medical treatment as before,  to f/u any worsening symptoms or concerns BP Readings from Last 3 Encounters:  04/04/17 136/84  03/27/17 132/76  10/31/16 135/90

## 2017-04-07 NOTE — Assessment & Plan Note (Signed)
Mild to mod, for antibx course,  to f/u any worsening symptoms or concerns 

## 2017-04-07 NOTE — Assessment & Plan Note (Signed)
Lab Results  Component Value Date   HGBA1C 6.1 03/27/2017  stable overall by history and exam, recent data reviewed with pt, and pt to continue medical treatment as before,  to f/u any worsening symptoms or concerns

## 2017-04-09 ENCOUNTER — Ambulatory Visit: Payer: 59 | Attending: Internal Medicine | Admitting: Physical Therapy

## 2017-04-09 ENCOUNTER — Encounter: Payer: Self-pay | Admitting: Physical Therapy

## 2017-04-09 DIAGNOSIS — R262 Difficulty in walking, not elsewhere classified: Secondary | ICD-10-CM

## 2017-04-09 DIAGNOSIS — M25562 Pain in left knee: Secondary | ICD-10-CM

## 2017-04-09 NOTE — Therapy (Signed)
[place], [place], [address] Phone: [phone]   Fax:  [phone]  Physical Therapy Treatment  Patient Details  Name: Alicia Hoover MRN: [id number] Date of Birth: 05-15-1976 Referring Provider: [name], MD  Encounter Date: 04/09/2017      PT End of Session - 04/09/17 1703    Visit Number 2   Number of Visits 13   Date for PT Re-Evaluation 05/17/17   PT Start Time 1632   PT Stop Time 1714   PT Time Calculation (min) 42 min   Activity Tolerance Patient tolerated treatment well   Behavior During Therapy [facility] for tasks assessed/performed      Past Medical History:  Diagnosis Date  . Asthma   . GERD (gastroesophageal reflux disease)   . Hypersomnia with sleep apnea   . Hypertension   . Migraine   . Rhinitis     Past Surgical History:  Procedure Laterality Date  . CHOLECYSTECTOMY    . TOOTH EXTRACTION      There were no vitals filed for this visit.      Subjective Assessment - 04/09/17 1632    Subjective "doing about the same since the last session, doing the exercsises consistently"    Currently in Pain? Yes   Pain Score 6    Pain Location Knee   Pain Orientation Left   Pain Descriptors / Indicators Tightness   Pain Onset More than a month ago   Pain Frequency Intermittent   Aggravating Factors  sitting for a while and laying down   Pain Relieving Factors resting, getting out of postion                         [facility] Adult PT Treatment/Exercise - 04/09/17 1636      Knee/Hip Exercises: Seated   Hamstring Curl 2 sets;10 reps  green theraband     Knee/Hip Exercises: Supine   Short Arc Quad Sets Left;3 sets;10 reps  with ball squeeze for VMO activation     Knee/Hip Exercises: Sidelying   Hip ABduction Left;10 reps;3 sets     Manual Therapy   Manual Therapy Taping   Manual therapy comments manual trigger point release over vastus lateralis on L   McConnell medial > Lateral taping on L     Ankle Exercises: Seated   Towel Crunch 5 reps   Other Seated Ankle Exercises toe yoga 2 x 10 3                 PT Education - 04/09/17 1652    Education provided Yes   Education Details reviewed previously provided HEP, benefits of McConnel taping for positioning of patella.    Person(s) Educated Patient   Methods Explanation;Verbal cues   Comprehension Verbalized understanding;Verbal cues required          PT Short Term Goals - 04/02/17 1713      PT SHORT TERM GOAL #1   Title Pt will verbalize use of RICE and compression stockings for edema control while at work by 7/20   Baseline began educating at eval   Time 3   Period Weeks   Status New     PT SHORT TERM GOAL #2   Title Pt will verbalize ability to incorporate exercises into day to decrease stiffness when standing   Baseline began educating at eval   Time 3   Period Weeks   Status New  PT Long Term Goals - 04/02/17 1716      PT LONG TERM GOAL #1   Title FOTO to 42% limitation to indicate significant improvement in functiona ability by 8/10   Baseline 72% limitaiton at eval   Time 6   Period Weeks   Status New     PT LONG TERM GOAL #2   Title Pt will be able to stand from her chair and begin walking without the need to pause to improve functional mobility   Baseline takes a while due to pain at eval   Time 6   Period Weeks   Status New     PT LONG TERM GOAL #3   Title Pt will be able to navigate stairs at home without limitation from knee   Baseline mild-mod limitation at eval   Time 6   Period Weeks   Status New     PT LONG TERM GOAL #4   Title Pt will be able to return to walking program for weight loss   Baseline has stopped due to fear of knee pain   Time 6   Period Weeks   Status New               Plan - 04/09/17 1705    Clinical Impression Statement pt reported pain rated at 6/10 in the L knee today. MTPR for vastus  lateralis and VMO facilication techniques which pt reported improvement of pain in the knee. trialed med>lat McConnel taping and hip / ankle strengthening which she performed well. post session she reported pain dropped to 0/10.    PT Next Visit Plan what did she think of taping, hamstring & hip abductor strength, McConnel taping? manual for vastus lateralis on L,    PT Home Exercise Plan toe yoga, toe scrunches, heel/toe raise, LAQ, knee flexion, seated HSS;    Consulted and Agree with Plan of Care Patient      Patient will benefit from skilled therapeutic intervention in order to improve the following deficits and impairments:  Difficulty walking, Increased muscle spasms, Decreased activity tolerance, Pain, Improper body mechanics, Impaired flexibility, Hypomobility, Decreased strength, Increased edema, Postural dysfunction  Visit Diagnosis: Acute pain of left knee  Difficulty in walking, not elsewhere classified     Problem List Patient Active Problem List   Diagnosis Date Noted  . Acute sinus infection 04/04/2017  . Primary osteoarthritis of left knee 03/28/2017  . Visit for screening mammogram 03/27/2017  . Chronic pain of left knee 03/27/2017  . Hyperglycemia 03/08/2016  . Eczema, allergic 03/08/2016  . Allergic rhinitis, cause unspecified 02/24/2014  . Routine general medical examination at a health care facility 02/24/2014  . Essential hypertension, benign 02/24/2014  . Morbid obesity ([place]) 11/01/2010  . HYPERSOMNIA WITH SLEEP APNEA UNSPECIFIED 10/16/2010   Starr [place] PT, DPT, LAT, ATC  04/09/17  5:19 PM      [place] [facility] [address] [place], [place], [address] Phone: [phone]   Fax:  [phone]  Name: Alicia Hoover MRN: [id number] Date of Birth: 1976/09/25

## 2017-04-11 ENCOUNTER — Ambulatory Visit: Payer: 59 | Admitting: Rehabilitative and Restorative Service Providers"

## 2017-04-11 DIAGNOSIS — R262 Difficulty in walking, not elsewhere classified: Secondary | ICD-10-CM

## 2017-04-11 DIAGNOSIS — M25562 Pain in left knee: Secondary | ICD-10-CM | POA: Diagnosis not present

## 2017-04-11 NOTE — Therapy (Signed)
[place], [place], [address] Phone: [phone]   Fax:  [phone]  Physical Therapy Treatment  Patient Details  Name: Alicia Hoover MRN: [id number] Date of Birth: July 28, 1976 Referring Provider: [name], MD  Encounter Date: 04/11/2017      PT End of Session - 04/11/17 1419    Visit Number 3   Number of Visits 13   Date for PT Re-Evaluation 05/17/17   Authorization Type U[employer]- 30 visit limit   PT Start Time 0135   PT Stop Time 0233   PT Time Calculation (min) 58 min   Activity Tolerance Patient tolerated treatment well   Behavior During Therapy [facility] for tasks assessed/performed      Past Medical History:  Diagnosis Date  . Asthma   . GERD (gastroesophageal reflux disease)   . Hypersomnia with sleep apnea   . Hypertension   . Migraine   . Rhinitis     Past Surgical History:  Procedure Laterality Date  . CHOLECYSTECTOMY    . TOOTH EXTRACTION      There were no vitals filed for this visit.      Subjective Assessment - 04/11/17 1343    Subjective 6/10 L knee pain; swollen like normal. the taping didn't do anything. It felt OK after the therapist did it but it didn't do anything yesterday. Pt still had one piece of white tape on knee and desires to remove in shower today..   Limitations Sitting;Standing;Walking   How long can you sit comfortably? 2 hours   How long can you stand comfortably? 30 min   How long can you walk comfortably? once pt states she gets going, she can walk an undeterminate amount of time   Patient Stated Goals get up from chair, stairs   Currently in Pain? Yes   Pain Score 6    Pain Location Knee   Pain Orientation Left   Pain Descriptors / Indicators Tightness   Pain Type Chronic pain   Pain Onset More than a month ago   Pain Frequency Intermittent   Aggravating Factors  sitting for a while at work (2 hours) and being stiff when she gets up   Pain  Relieving Factors resting, walking out stiffness   Multiple Pain Sites No                         [facility] Adult PT Treatment/Exercise - 04/11/17 0001      Knee/Hip Exercises: Supine   Other Supine Knee/Hip Exercises heel slides L x 10, quad sets x 20 with 5 sec hold, hamstring digs x 20 with 5 sec holds, SLR x 20, tilt/glute set with bridge x 20; 2 lb L SAQ x 20 with PT monitoring for hyperextension; SLR/hip abdct combo x 15     Knee/Hip Exercises: Sidelying   Hip ABduction Left  2 lbs     Knee/Hip Exercises: Prone   Hamstring Curl 1 set  2 lbs x 20   Hip Extension Left  2 lbs x 20     Vasopneumatic   Number Minutes Vasopneumatic  15 minutes   Vasopnuematic Location  Knee   Vasopneumatic Pressure Low   Vasopneumatic Temperature  38 deg                  PT Short Term Goals - 04/11/17 1349      PT SHORT TERM GOAL #1   Title Pt will verbalize  use of RICE and compression stockings for edema control while at work by 7/20   Baseline began educating at eval   Time 3   Period Weeks   Status On-going     PT SHORT TERM GOAL #2   Title Pt will verbalize ability to incorporate exercises into day to decrease stiffness when standing   Baseline began educating at eval   Time 3   Period Weeks   Status On-going           PT Long Term Goals - 04/11/17 1349      PT LONG TERM GOAL #1   Title FOTO to 42% limitation to indicate significant improvement in functiona ability by 8/10   Baseline 72% limitaiton at eval   Time 6   Period Weeks   Status On-going     PT LONG TERM GOAL #2   Title Pt will be able to stand from her chair and begin walking without the need to pause to improve functional mobility   Baseline takes a while due to pain at eval   Time 6   Period Weeks   Status On-going     PT LONG TERM GOAL #3   Title Pt will be able to navigate stairs at home without limitation from knee   Baseline mild-mod limitation at eval   Time 6   Period  Weeks   Status On-going     PT LONG TERM GOAL #4   Title Pt will be able to return to walking program for weight loss   Baseline has stopped due to fear of knee pain   Time 6   Period Weeks   Status On-going               Plan - 04/11/17 1348    Clinical Impression Statement Pt reports no change in pain with McConnell taping and pain remains the same as well as swelling. Pt would continue to benefit from PT for L knee pain management and strengthening to assist with improved mobility and decreased pain.   Rehab Potential Good   PT Frequency 2x / week   PT Duration 6 weeks   PT Treatment/Interventions ADLs/Self Care Home Management;Cryotherapy;Electrical Stimulation;Iontophoresis 37m/ml Dexamethasone;Functional mobility training;Stair training;Gait training;Ultrasound;Traction;Therapeutic activities;Moist Heat;Therapeutic exercise;Balance training;Neuromuscular re-education;Patient/family education;Passive range of motion;Manual techniques;Dry needling;Taping;Vasopneumatic Device   PT Next Visit Plan continue L LE strengthening and manual theraply for L Vastus Lateralis as needed   Consulted and Agree with Plan of Care Patient      Patient will benefit from skilled therapeutic intervention in order to improve the following deficits and impairments:  Difficulty walking, Increased muscle spasms, Decreased activity tolerance, Pain, Improper body mechanics, Impaired flexibility, Hypomobility, Decreased strength, Increased edema, Postural dysfunction  Visit Diagnosis: Acute pain of left knee  Difficulty in walking, not elsewhere classified     Problem List Patient Active Problem List   Diagnosis Date Noted  . Acute sinus infection 04/04/2017  . Primary osteoarthritis of left knee 03/28/2017  . Visit for screening mammogram 03/27/2017  . Chronic pain of left knee 03/27/2017  . Hyperglycemia 03/08/2016  . Eczema, allergic 03/08/2016  . Allergic rhinitis, cause unspecified  02/24/2014  . Routine general medical examination at a health care facility 02/24/2014  . Essential hypertension, benign 02/24/2014  . Morbid [place] ([place] 11/01/2010  . HYPERSOMNIA WITH SLEEP APNEA UNSPECIFIED 10/16/2010    AMyra Rude PT 04/11/2017, 2:20 PM  [facility] [address]Phone:  [phone]   Fax:  [phone]  Name: Alicia Hoover MRN: [id number] Date of Birth: 04-Apr-1976

## 2017-04-11 NOTE — Therapy (Deleted)
[place], [place], [address] Phone: [phone]   Fax:  [phone]  Physical Therapy Treatment  Patient Details  Name: Alicia Hoover MRN: [id number] Date of Birth: [birth year] Referring Provider: [name], MD  Encounter Date: 04/11/2017    Past Medical History:  Diagnosis Date  . Asthma   . GERD (gastroesophageal reflux disease)   . Hypersomnia with sleep apnea   . Hypertension   . Migraine   . Rhinitis     Past Surgical History:  Procedure Laterality Date  . CHOLECYSTECTOMY    . TOOTH EXTRACTION      There were no vitals filed for this visit.      Subjective Assessment - 04/11/17 1343    Subjective 6/10 L knee pain; swollen like normal. the taping didn't do anything. It felt OK after the therapist did it but it didn't do anything yesterday. Pt still had one piece of white tape on knee and desires to remove in shower today..   Limitations Sitting;Standing;Walking   How long can you sit comfortably? 2 hours   How long can you stand comfortably? 30 min   How long can you walk comfortably? once pt states she gets going, she can walk an undeterminate amount of time   Patient Stated Goals get up from chair, stairs   Currently in Pain? Yes   Pain Score 6    Pain Location Knee   Pain Orientation Left   Pain Descriptors / Indicators Tightness   Pain Type Chronic pain   Pain Onset More than a month ago   Pain Frequency Intermittent   Aggravating Factors  sitting for a while at work (2 hours) and being stiff when she gets up   Pain Relieving Factors resting, walking out stiffness   Multiple Pain Sites No                         [facility] Adult PT Treatment/Exercise - 04/11/17 0001      Knee/Hip Exercises: Supine   Other Supine Knee/Hip Exercises heel slides L x 10, quad sets x 20 with 5 sec hold, hamstring digs x 20 with 5 sec holds, SLR x 20, tilt/glute set with bridge x  20; 2 lb L SAQ x 20 with PT monitoring for hyperextension; SLR/hip abdct combo x 15     Knee/Hip Exercises: Sidelying   Hip ABduction Left  2 lbs     Knee/Hip Exercises: Prone   Hamstring Curl 1 set  2 lbs x 20   Hip Extension Left  2 lbs x 20     Vasopneumatic   Number Minutes Vasopneumatic  15 minutes   Vasopnuematic Location  Knee   Vasopneumatic Pressure Low   Vasopneumatic Temperature  38 deg                  PT Short Term Goals - 04/11/17 1349      PT SHORT TERM GOAL #1   Title Pt will verbalize use of RICE and compression stockings for edema control while at work by 7/20   Baseline began educating at eval   Time 3   Period Weeks   Status On-going     PT SHORT TERM GOAL #2   Title Pt will verbalize ability to incorporate exercises into day to decrease stiffness when standing   Baseline began educating at eval   Time 3   Period Weeks   Status On-going  PT Long Term Goals - 04/11/17 1349      PT LONG TERM GOAL #1   Title FOTO to 42% limitation to indicate significant improvement in functiona ability by 8/10   Baseline 72% limitaiton at eval   Time 6   Period Weeks   Status On-going     PT LONG TERM GOAL #2   Title Pt will be able to stand from her chair and begin walking without the need to pause to improve functional mobility   Baseline takes a while due to pain at eval   Time 6   Period Weeks   Status On-going     PT LONG TERM GOAL #3   Title Pt will be able to navigate stairs at home without limitation from knee   Baseline mild-mod limitation at eval   Time 6   Period Weeks   Status On-going     PT LONG TERM GOAL #4   Title Pt will be able to return to walking program for weight loss   Baseline has stopped due to fear of knee pain   Time 6   Period Weeks   Status On-going               Plan - 04/11/17 1348    Clinical Impression Statement Pt reports no change in pain with McConnell taping and pain remains the  same as well as swelling. Pt would continue to benefit from PT for L knee pain management and strengthening to assist with improved mobility and decreased pain.   Rehab Potential Good   PT Frequency 2x / week   PT Duration 6 weeks   PT Treatment/Interventions ADLs/Self Care Home Management;Cryotherapy;Electrical Stimulation;Iontophoresis 35m/ml Dexamethasone;Functional mobility training;Stair training;Gait training;Ultrasound;Traction;Therapeutic activities;Moist Heat;Therapeutic exercise;Balance training;Neuromuscular re-education;Patient/family education;Passive range of motion;Manual techniques;Dry needling;Taping;Vasopneumatic Device   PT Next Visit Plan continue L LE strengthening and manual theraply for L Vastus Lateralis as needed   Consulted and Agree with Plan of Care Patient      Patient will benefit from skilled therapeutic intervention in order to improve the following deficits and impairments:  Difficulty walking, Increased muscle spasms, Decreased activity tolerance, Pain, Improper body mechanics, Impaired flexibility, Hypomobility, Decreased strength, Increased edema, Postural dysfunction  Visit Diagnosis: Acute pain of left knee  Difficulty in walking, not elsewhere classified     Problem List Patient Active Problem List   Diagnosis Date Noted  . Acute sinus infection 04/04/2017  . Primary osteoarthritis of left knee 03/28/2017  . Visit for screening mammogram 03/27/2017  . Chronic pain of left knee 03/27/2017  . Hyperglycemia 03/08/2016  . Eczema, allergic 03/08/2016  . Allergic rhinitis, cause unspecified 02/24/2014  . Routine general medical examination at a health care facility 02/24/2014  . Essential hypertension, benign 02/24/2014  . Morbid obesity ([place] 11/01/2010  . HYPERSOMNIA WITH SLEEP APNEA UNSPECIFIED 10/16/2010    AMyra Rude, PT 04/11/2017, 2:17 PM  [facility]  [address]Phone: [phone]  Fax:  [phone] Name: Alicia KUBAMRN: [id number] of Birth: [birth year]

## 2017-04-16 ENCOUNTER — Encounter: Payer: Self-pay | Admitting: Internal Medicine

## 2017-04-17 ENCOUNTER — Encounter: Payer: Self-pay | Admitting: Physical Therapy

## 2017-04-17 ENCOUNTER — Ambulatory Visit: Payer: 59 | Admitting: Physical Therapy

## 2017-04-17 ENCOUNTER — Encounter: Payer: Self-pay | Admitting: Internal Medicine

## 2017-04-17 DIAGNOSIS — M25562 Pain in left knee: Secondary | ICD-10-CM

## 2017-04-17 DIAGNOSIS — R262 Difficulty in walking, not elsewhere classified: Secondary | ICD-10-CM

## 2017-04-17 NOTE — Therapy (Signed)
[place], [place], [address] Phone: [phone]   Fax:  [phone]  Physical Therapy Treatment  Patient Details  Name: AVERIE HORNBAKER MRN: [id number] Date of Birth: [birth year] Referring Provider: [name], MD  Encounter Date: 04/17/2017      PT End of Session - 04/17/17 1631    Visit Number 4   Number of Visits 13   Date for PT Re-Evaluation 05/17/17   Authorization Type UHC- 30 visit limit   PT Start Time 1631   PT Stop Time 1721   PT Time Calculation (min) 50 min   Activity Tolerance Patient tolerated treatment well   Behavior During Therapy [facility] for tasks assessed/performed      Past Medical History:  Diagnosis Date  . Asthma   . GERD (gastroesophageal reflux disease)   . Hypersomnia with sleep apnea   . Hypertension   . Migraine   . Rhinitis     Past Surgical History:  Procedure Laterality Date  . CHOLECYSTECTOMY    . TOOTH EXTRACTION      There were no vitals filed for this visit.      Subjective Assessment - 04/17/17 1632    Subjective Reports knee feels incredibly tight around anterior aspect of knee joint. Unchanged by stretches and exercises. Feels good after therapy but the next morning when she wakes up, pain is back.    Patient Stated Goals get up from chair, stairs   Currently in Pain? Yes   Pain Score 7    Pain Location Knee   Pain Orientation Left   Pain Descriptors / Indicators Tightness   Aggravating Factors  getting up from sitting or laying down                         [facility] Adult PT Treatment/Exercise - 04/17/17 0001      Knee/Hip Exercises: Stretches   Passive Hamstring Stretch Limitations seated edge of bed  with strap   Gastroc Stretch 2 reps;30 seconds   Gastroc Stretch Limitations slant board     Knee/Hip Exercises: Aerobic   Nustep 5 min L5     Knee/Hip Exercises: Seated   Long Arc Quad 10 reps   Long Arc Quad Limitations  seated EOB     Modalities   Modalities Cryotherapy     Cryotherapy   Number Minutes Cryotherapy 10 Minutes   Cryotherapy Location Knee   Type of Cryotherapy Ice pack     Manual Therapy   Manual therapy comments IASTM L hamstrings & gastroc/soleus                PT Education - 04/17/17 1717    Education provided Yes   Education Details imaging not likely to change POC, frequent stretching at work, bike under desk. exercise form/rationale, getting out of bed to reduce tightness   Person(s) Educated Patient   Methods Explanation;Verbal cues;Demonstration   Comprehension Verbalized understanding;Verbal cues required;Need further instruction          PT Short Term Goals - 04/11/17 1349      PT SHORT TERM GOAL #1   Title Pt will verbalize use of RICE and compression stockings for edema control while at work by 7/20   Baseline began educating at eval   Time 3   Period Weeks   Status On-going     PT SHORT TERM GOAL #2   Title Pt will verbalize ability to incorporate exercises  into day to decrease stiffness when standing   Baseline began educating at eval   Time 3   Period Weeks   Status On-going           PT Long Term Goals - 04/11/17 1349      PT LONG TERM GOAL #1   Title FOTO to 42% limitation to indicate significant improvement in functiona ability by 8/10   Baseline 72% limitaiton at eval   Time 6   Period Weeks   Status On-going     PT LONG TERM GOAL #2   Title Pt will be able to stand from her chair and begin walking without the need to pause to improve functional mobility   Baseline takes a while due to pain at eval   Time 6   Period Weeks   Status On-going     PT LONG TERM GOAL #3   Title Pt will be able to navigate stairs at home without limitation from knee   Baseline mild-mod limitation at eval   Time 6   Period Weeks   Status On-going     PT LONG TERM GOAL #4   Title Pt will be able to return to walking program for weight loss    Baseline has stopped due to fear of knee pain   Time 6   Period Weeks   Status On-going               Plan - 04/17/17 1718    Clinical Impression Statement Significant tightness noted in posterior musculature of L leg resulting in concordant tightness at knee joint. Discussed frequency of stretching in order to avoid tightness and reduce pain when getting out of chair. Pt wants to have MRI of knee to see if meniscus tear is present, We discussed how imaging would not change my POC at this time but she feels that she would feel better knowing.    PT Treatment/Interventions ADLs/Self Care Home Management;Cryotherapy;Electrical Stimulation;Iontophoresis 75m/ml Dexamethasone;Functional mobility training;Stair training;Gait training;Ultrasound;Traction;Therapeutic activities;Moist Heat;Therapeutic exercise;Balance training;Neuromuscular re-education;Patient/family education;Passive range of motion;Manual techniques;Dry needling;Taping;Vasopneumatic Device   PT Next Visit Plan how did stretches go at work? manual to hamstrings & gastroc/soleus, hip abductor strength   PT Home Exercise Plan toe yoga, toe scrunches, heel/toe raise, LAQ, knee flexion, seated HSS;    Consulted and Agree with Plan of Care Patient      Patient will benefit from skilled therapeutic intervention in order to improve the following deficits and impairments:  Difficulty walking, Increased muscle spasms, Decreased activity tolerance, Pain, Improper body mechanics, Impaired flexibility, Hypomobility, Decreased strength, Increased edema, Postural dysfunction  Visit Diagnosis: Acute pain of left knee  Difficulty in walking, not elsewhere classified     Problem List Patient Active Problem List   Diagnosis Date Noted  . Acute sinus infection 04/04/2017  . Primary osteoarthritis of left knee 03/28/2017  . Visit for screening mammogram 03/27/2017  . Chronic pain of left knee 03/27/2017  . Hyperglycemia 03/08/2016  .  Eczema, allergic 03/08/2016  . Allergic rhinitis, cause unspecified 02/24/2014  . Routine general medical examination at a health care facility 02/24/2014  . Essential hypertension, benign 02/24/2014  . [place] 11/01/2010  . HYPERSOMNIA WITH SLEEP APNEA UNSPECIFIED 10/16/2010    Joelle Roswell C. Jaydien Panepinto PT, DPT 04/17/17 5:24 PM   [facility]G[place] [address]Phone: [phone]  Fax:  [phone] Name: TJURNI CESAROMRN: [id number] of Birth: 508/03/1976

## 2017-04-18 ENCOUNTER — Other Ambulatory Visit: Payer: Self-pay | Admitting: Internal Medicine

## 2017-04-18 ENCOUNTER — Ambulatory Visit: Payer: 59 | Admitting: Physical Therapy

## 2017-04-18 ENCOUNTER — Encounter: Payer: Self-pay | Admitting: Physical Therapy

## 2017-04-18 DIAGNOSIS — M25562 Pain in left knee: Secondary | ICD-10-CM | POA: Diagnosis not present

## 2017-04-18 DIAGNOSIS — R262 Difficulty in walking, not elsewhere classified: Secondary | ICD-10-CM

## 2017-04-18 DIAGNOSIS — G8929 Other chronic pain: Secondary | ICD-10-CM

## 2017-04-18 DIAGNOSIS — M2392 Unspecified internal derangement of left knee: Secondary | ICD-10-CM

## 2017-04-18 DIAGNOSIS — M1712 Unilateral primary osteoarthritis, left knee: Secondary | ICD-10-CM

## 2017-04-18 NOTE — Therapy (Addendum)
[place], [place], [address] Phone: [phone]   Fax:  [phone]  Physical Therapy Treatment/Discharge summary  Patient Details  Name: Alicia Hoover MRN: [id number] Date of Birth: 01-22-1976 Referring Provider: [name], MD  Encounter Date: 04/18/2017      PT End of Session - 04/18/17 1636    Visit Number 5   Number of Visits 13   Date for PT Re-Evaluation 05/17/17   Authorization Type [employer]- 30 visit limit   PT Start Time 1636   PT Stop Time 1713   PT Time Calculation (min) 37 min   Activity Tolerance Patient tolerated treatment well   Behavior During Therapy [facility] for tasks assessed/performed      Past Medical History:  Diagnosis Date  . Asthma   . GERD (gastroesophageal reflux disease)   . Hypersomnia with sleep apnea   . Hypertension   . Migraine   . Rhinitis     Past Surgical History:  Procedure Laterality Date  . CHOLECYSTECTOMY    . TOOTH EXTRACTION      There were no vitals filed for this visit.      Subjective Assessment - 04/18/17 1636    Subjective Knee does not feel as tight today as it did yesterday. R knee is starting to bother her. Still has pain when she gets up. MRI scheduled for the 22nd.                          [place] [facility] PT Treatment/Exercise - 04/18/17 0001      Exercises   Exercises Other Exercises   Other Exercises  reformer: sidelying press away; static bridge supine     Knee/Hip Exercises: Stretches   Passive Hamstring Stretch Limitations seated edge of bed  with strap   Gastroc Stretch 2 reps;30 seconds   Gastroc Stretch Limitations slant board     Knee/Hip Exercises: Aerobic   Nustep 5 min L5     Knee/Hip Exercises: Machines for Strengthening   Total Gym Leg Press horizontal leg press 0 added weight x30 leg press, x30 heel raises                PT Education - 04/18/17 1639    Education provided Yes    Education Details exercise form/rationale, HEP, importance of regular stretching and movement   Person(s) Educated Patient   Methods Explanation;Demonstration;Tactile cues;Verbal cues   Comprehension Returned demonstration;Verbal cues required;Verbalized understanding;Tactile cues required;Need further instruction          PT Short Term Goals - 04/11/17 1349      PT SHORT TERM GOAL #1   Title Pt will verbalize use of RICE and compression stockings for edema control while at work by 7/20   Baseline began educating at eval   Time 3   Period Weeks   Status On-going     PT SHORT TERM GOAL #2   Title Pt will verbalize ability to incorporate exercises into day to decrease stiffness when standing   Baseline began educating at eval   Time 3   Period Weeks   Status On-going           PT Long Term Goals - 04/11/17 1349      PT LONG TERM GOAL #1   Title FOTO to 42% limitation to indicate significant improvement in functiona ability by 8/10   Baseline 72% limitaiton at eval   Time 6  Period Weeks   Status On-going     PT LONG TERM GOAL #2   Title Pt will be able to stand from her chair and begin walking without the need to pause to improve functional mobility   Baseline takes a while due to pain at eval   Time 6   Period Weeks   Status On-going     PT LONG TERM GOAL #3   Title Pt will be able to navigate stairs at home without limitation from knee   Baseline mild-mod limitation at eval   Time 6   Period Weeks   Status On-going     PT LONG TERM GOAL #4   Title Pt will be able to return to walking program for weight loss   Baseline has stopped due to fear of knee pain   Time 6   Period Weeks   Status On-going               Plan - 04/18/17 1717    Clinical Impression Statement Challenged CKC strength using leg press and reformer for controlled environment and decreased pressure. Pt denied pain but was challenged by exercises. Pt has chosen to be on hold next  week for her MRI and will determine POC depending on outcome.s   PT Treatment/Interventions ADLs/Self Care Home Management;Cryotherapy;Electrical Stimulation;Iontophoresis 46m/ml Dexamethasone;Functional mobility training;Stair training;Gait training;Ultrasound;Traction;Therapeutic activities;Moist Heat;Therapeutic exercise;Balance training;Neuromuscular re-education;Patient/family education;Passive range of motion;Manual techniques;Dry needling;Taping;Vasopneumatic Device   PT Next Visit Plan re-evaluate   PT Home Exercise Plan toe yoga, toe scrunches, heel/toe raise, LAQ, knee flexion, seated HSS;    Consulted and Agree with Plan of Care Patient      Patient will benefit from skilled therapeutic intervention in order to improve the following deficits and impairments:  Difficulty walking, Increased muscle spasms, Decreased activity tolerance, Pain, Improper body mechanics, Impaired flexibility, Hypomobility, Decreased strength, Increased edema, Postural dysfunction  Visit Diagnosis: Difficulty in walking, not elsewhere classified     Problem List Patient Active Problem List   Diagnosis Date Noted  . Internal derangement of left knee 04/18/2017  . Acute sinus infection 04/04/2017  . Primary osteoarthritis of left knee 03/28/2017  . Visit for screening mammogram 03/27/2017  . Chronic pain of left knee 03/27/2017  . Hyperglycemia 03/08/2016  . Eczema, allergic 03/08/2016  . Allergic rhinitis, cause unspecified 02/24/2014  . Routine general medical examination at a health care facility 02/24/2014  . Essential hypertension, benign 02/24/2014  . Morbid obesity (HBertram 11/01/2010  . HYPERSOMNIA WITH SLEEP APNEA UNSPECIFIED 10/16/2010    Ingri Diemer C. Alexsandra Shontz PT, DPT 04/18/17 5:19 PM   [facility] [place] [address]Phone: [phone]  Fax:  [phone] Name: Alicia BHARGAVAMRN: [id number] of Birth:  512/05/1976 PHYSICAL THERAPY DISCHARGE SUMMARY  Visits from Start of Care: 5  Current functional level related to goals / functional outcomes: See above   Remaining deficits: See above   Education / Equipment: Anatomy of condition, POC, HEP, exercise form/rationale  Plan: Patient agrees to discharge.  Patient goals were not met. Patient is being discharged due to a change in medical status.  ?????   Referred to Ortho following MRI on knee, pt wishes to discontinue PT at this time. Left message telling pt she would need a new script to return to PT in the future. Grettell Ransdell C. Areen Trautner PT, DPT 05/08/17 9:38 AM

## 2017-04-22 ENCOUNTER — Ambulatory Visit: Payer: 59 | Admitting: Physical Therapy

## 2017-04-24 ENCOUNTER — Encounter: Payer: 59 | Admitting: Physical Therapy

## 2017-04-28 ENCOUNTER — Ambulatory Visit
Admission: RE | Admit: 2017-04-28 | Discharge: 2017-04-28 | Disposition: A | Discharge status: Home / Self Care | Payer: 59 | Source: Ambulatory Visit | Attending: Internal Medicine | Admitting: Internal Medicine

## 2017-04-28 DIAGNOSIS — M25562 Pain in left knee: Principal | ICD-10-CM

## 2017-04-28 DIAGNOSIS — M1712 Unilateral primary osteoarthritis, left knee: Secondary | ICD-10-CM

## 2017-04-28 DIAGNOSIS — M2392 Unspecified internal derangement of left knee: Secondary | ICD-10-CM

## 2017-04-28 DIAGNOSIS — G8929 Other chronic pain: Secondary | ICD-10-CM

## 2017-04-29 ENCOUNTER — Encounter: Payer: Self-pay | Admitting: Internal Medicine

## 2017-04-29 ENCOUNTER — Other Ambulatory Visit: Payer: Self-pay | Admitting: Internal Medicine

## 2017-04-29 DIAGNOSIS — M2392 Unspecified internal derangement of left knee: Secondary | ICD-10-CM

## 2017-04-30 ENCOUNTER — Encounter: Payer: 59 | Admitting: Physical Therapy

## 2017-05-01 ENCOUNTER — Encounter: Payer: 59 | Admitting: Physical Therapy

## 2017-05-04 ENCOUNTER — Other Ambulatory Visit: Payer: Self-pay | Admitting: Internal Medicine

## 2017-05-04 DIAGNOSIS — L239 Allergic contact dermatitis, unspecified cause: Secondary | ICD-10-CM

## 2017-05-06 ENCOUNTER — Encounter: Payer: 59 | Admitting: Physical Therapy

## 2017-05-06 ENCOUNTER — Telehealth: Payer: Self-pay | Admitting: Physical Therapy

## 2017-05-06 NOTE — Telephone Encounter (Signed)
Contacted pt regarding missed appointment today. Left message that appointment is scheduled Wed, requested call back.  Teia Freitas C. Dakin Madani PT, DPT 05/06/17 4:51 PM

## 2017-05-07 ENCOUNTER — Encounter: Payer: Self-pay | Admitting: Rehabilitative and Restorative Service Providers"

## 2017-05-08 ENCOUNTER — Encounter: Payer: 59 | Admitting: Physical Therapy

## 2017-05-20 ENCOUNTER — Other Ambulatory Visit: Payer: Self-pay | Admitting: Obstetrics

## 2017-05-26 ENCOUNTER — Encounter: Payer: Self-pay | Admitting: Internal Medicine

## 2017-05-27 ENCOUNTER — Other Ambulatory Visit: Payer: Self-pay | Admitting: Internal Medicine

## 2017-05-27 DIAGNOSIS — I10 Essential (primary) hypertension: Secondary | ICD-10-CM

## 2017-05-27 MED ORDER — LOSARTAN POTASSIUM-HCTZ 100-12.5 MG PO TABS: 1.0000 | ORAL_TABLET | Freq: Every day | ORAL | 1 refills | Status: DC

## 2017-06-11 ENCOUNTER — Encounter: Payer: Self-pay | Admitting: Internal Medicine

## 2017-06-11 DIAGNOSIS — I10 Essential (primary) hypertension: Secondary | ICD-10-CM

## 2017-08-14 ENCOUNTER — Encounter: Payer: Self-pay | Admitting: Internal Medicine

## 2017-08-24 ENCOUNTER — Other Ambulatory Visit: Payer: Self-pay | Admitting: Internal Medicine

## 2017-08-24 ENCOUNTER — Other Ambulatory Visit: Payer: Self-pay | Admitting: Obstetrics

## 2017-08-24 DIAGNOSIS — N946 Dysmenorrhea, unspecified: Secondary | ICD-10-CM

## 2017-09-05 ENCOUNTER — Ambulatory Visit: Payer: Self-pay

## 2017-09-05 NOTE — Telephone Encounter (Signed)
Pt. called to report having chest fluttering.  Reported this started about 2 days ago, and has been intermittent, but today it has continued since this morning at 8:00 AM.  Reported it feels like skipped beats. Denied racing of heart, chest pain, sweating, or shortness of breath.  Reported heart rate at 86 bpm, from fitness device she is wearing.  Stated she had dizziness earlier, when it first started, but denies dizziness at this time.  Stated I just want to lay down right now.  Per protocol advised pt. To be seen within 4 hrs.  Unable to find available appts. At [facility] [place], [facility] [place], or [facility] @ [place].  The pt. stated she will go to [facility] today for eval.        Reason for Disposition . [1] Skipped or extra beat(s) AND [2] occurs 4 or more times per minute  Answer Assessment - Initial Assessment Questions 1. DESCRIPTION: "Please describe your heart rate or heart beat that you are having" (e.g., fast/slow, regular/irregular, skipped or extra beats, "palpitations")     Denies racing of heart or pounding; notices irregular beats. 2. ONSET: "When did it start?" (Minutes, hours or days)      2 days ago  3. DURATION: "How long does it last" (e.g., seconds, minutes, hours)     About 4 hrs.  4. PATTERN "Does it come and go, or has it been constant since it started?"  "Does it get worse with exertion?"   "Are you feeling it now?"     Has been constant since she got to work 5. TAP: "Using your hand, can you tap out what you are feeling on a chair or table in front of you, so that I can hear?" (Note: not all patients can do this)       n/a 6. HEART RATE: "Can you tell me your heart rate?" "How many beats in 15 seconds?"  (Note: not all patients can do this)       86 bpm  7. RECURRENT SYMPTOM: "Have you ever had this before?" If so, ask: "When was the last time?" and "What happened that time?"      It has but hasn't lasted this long  8. CAUSE: "What do you think is causing the palpitations?"  No caffeine intake today; is under some stress that is work-related  9. CARDIAC HISTORY: "Do you have any history of heart disease?" (e.g., heart attack, angina, bypass surgery, angioplasty, arrhythmia)      No cardiac hx.  10. OTHER SYMPTOMS: "Do you have any other symptoms?" (e.g., dizziness, chest pain, sweating, difficulty breathing)       Dizziness earlier today; no chest pain, no sweating, no difficulty breathing.  11. PREGNANCY: "Is there any chance you are pregnant?" "When was your last menstrual period?"       No ; LMP 11/25  Protocols used: HEART RATE AND HEARTBEAT QUESTIONS-A-AH

## 2017-09-16 ENCOUNTER — Other Ambulatory Visit: Payer: Self-pay | Admitting: Obstetrics

## 2017-09-16 DIAGNOSIS — N760 Acute vaginitis: Principal | ICD-10-CM

## 2017-09-16 DIAGNOSIS — B9689 Other specified bacterial agents as the cause of diseases classified elsewhere: Secondary | ICD-10-CM

## 2018-01-07 ENCOUNTER — Other Ambulatory Visit: Payer: Self-pay

## 2018-01-07 ENCOUNTER — Encounter: Payer: Self-pay | Admitting: Obstetrics

## 2018-01-07 ENCOUNTER — Ambulatory Visit (INDEPENDENT_AMBULATORY_CARE_PROVIDER_SITE_OTHER): Payer: 59 | Admitting: Obstetrics

## 2018-01-07 VITALS — BP 136/85 | HR 74 | Ht 65.0 in | Wt 313.4 lb

## 2018-01-07 DIAGNOSIS — Z1151 Encounter for screening for human papillomavirus (HPV): Secondary | ICD-10-CM

## 2018-01-07 DIAGNOSIS — Z01419 Encounter for gynecological examination (general) (routine) without abnormal findings: Secondary | ICD-10-CM

## 2018-01-07 DIAGNOSIS — Z124 Encounter for screening for malignant neoplasm of cervix: Secondary | ICD-10-CM | POA: Diagnosis not present

## 2018-01-07 DIAGNOSIS — N760 Acute vaginitis: Secondary | ICD-10-CM

## 2018-01-07 DIAGNOSIS — Z113 Encounter for screening for infections with a predominantly sexual mode of transmission: Secondary | ICD-10-CM | POA: Diagnosis not present

## 2018-01-07 DIAGNOSIS — B9689 Other specified bacterial agents as the cause of diseases classified elsewhere: Secondary | ICD-10-CM

## 2018-01-07 DIAGNOSIS — Z1239 Encounter for other screening for malignant neoplasm of breast: Secondary | ICD-10-CM

## 2018-01-07 DIAGNOSIS — Z3041 Encounter for surveillance of contraceptive pills: Secondary | ICD-10-CM

## 2018-01-07 DIAGNOSIS — N946 Dysmenorrhea, unspecified: Secondary | ICD-10-CM

## 2018-01-07 LAB — HM PAP SMEAR

## 2018-01-07 MED ORDER — TINIDAZOLE 500 MG PO TABS: 1000.0000 mg | ORAL_TABLET | Freq: Every day | ORAL | 2 refills | Status: DC

## 2018-01-07 MED ORDER — NORGESTIMATE-ETH ESTRADIOL 0.25-35 MG-MCG PO TABS: 1.0000 | ORAL_TABLET | Freq: Every day | ORAL | 12 refills | Status: DC

## 2018-01-07 MED ORDER — IBUPROFEN 800 MG PO TABS: 800.0000 mg | ORAL_TABLET | Freq: Three times a day (TID) | ORAL | 5 refills | Status: DC | PRN

## 2018-01-07 NOTE — Progress Notes (Signed)
Subjective:        Alicia Hoover is a 42 y.o. female here for a routine exam.  Current complaints: None.    Personal health questionnaire:  Is patient [name], have a family history of breast and/or ovarian cancer: no Is there a family history of uterine cancer diagnosed at age < 53, gastrointestinal cancer, urinary tract cancer, family member who is a Field seismologist syndrome-associated carrier: no Is the patient overweight and hypertensive, family history of diabetes, personal history of gestational diabetes, preeclampsia or PCOS: no Is patient over 29, have PCOS,  family history of premature CHD under age 28, diabetes, smoke, have hypertension or peripheral artery disease:  no At any time, has a partner hit, kicked or otherwise hurt or frightened you?: no Over the past 2 weeks, have you felt down, depressed or hopeless?: no Over the past 2 weeks, have you felt little interest or pleasure in doing things?:no   Gynecologic History Patient's last menstrual period was 12/27/2017 (exact date). Contraception: OCP (estrogen/progesterone) Last Pap: 2018. Results were: ASCUS with negative HPV Last mammogram: 2017. Results were: normal  Obstetric History OB History  Gravida Para Term Preterm AB Living  3 3 3     3   SAB TAB Ectopic Multiple Live Births          3    # Outcome Date GA Lbr Len/2nd Weight Sex Delivery Anes PTL Lv  3 Term 07/01/06 [redacted]w[redacted]d  F Vag-Spont EPI  LIV  2 Term 08/31/96 466w0d F Vag-Spont EPI  LIV  1 Term 04/21/90 [id number] Vag-Spont Gen  LIV    Past Medical History:  Diagnosis Date  . Asthma   . GERD (gastroesophageal reflux disease)   . Hypersomnia with sleep apnea   . Hypertension   . Migraine   . Rhinitis     Past Surgical History:  Procedure Laterality Date  . CHOLECYSTECTOMY    . TOOTH EXTRACTION       Current Outpatient Medications:  .  benazepril-hydrochlorthiazide (LOTENSIN HCT) 20-12.5 MG tablet, Take 1 tablet by mouth daily., Disp: ,  Rfl: 3 .  CONTRAVE 8-90 MG TB12, TAKE 2 TABLETS BY MOUTH 2 (TWO) TIMES DAILY., Disp: 360 tablet, Rfl: 1 .  fluticasone (FLONASE) 50 MCG/ACT nasal spray, Place 2 sprays into the nose daily as needed for allergies. , Disp: , Rfl:  .  Multiple Vitamin (MULTIVITAMIN) tablet, Take 1 tablet by mouth daily., Disp: , Rfl:  .  omeprazole (PRILOSEC) 40 MG capsule, Take 40 mg by mouth daily., Disp: , Rfl:  .  ondansetron (ZOFRAN) 4 MG tablet, Take 1 tablet (4 mg total) by mouth every 8 (eight) hours as needed for nausea or vomiting., Disp: 20 tablet, Rfl: 0 .  APPLE CIDER VINEGAR PO, Take 1 capsule by mouth daily., Disp: , Rfl:  .  BIOTIN PO, Take 1 capsule by mouth daily., Disp: , Rfl:  .  doxepin (SINEQUAN) 10 MG capsule, TAKE 1 CAPSULE (10 MG TOTAL) BY MOUTH AT BEDTIME. (Patient not taking: Reported on 01/07/2018), Disp: 90 capsule, Rfl: 3 .  ibuprofen (ADVIL,MOTRIN) 800 MG tablet, Take 1 tablet (800 mg total) by mouth every 8 (eight) hours as needed., Disp: 30 tablet, Rfl: 5 .  losartan-hydrochlorothiazide (HYZAAR) 100-12.5 MG tablet, Take 1 tablet by mouth daily. (Patient not taking: Reported on 01/07/2018), Disp: 90 tablet, Rfl: 1 .  norgestimate-ethinyl estradiol (SPRINTEC 28) 0.25-35 MG-MCG tablet, Take 1 tablet by mouth daily., Disp: 1 Package,  Rfl: 12 .  Omega-3 Fatty Acids (FISH OIL) 1360 MG CAPS, Take 1,500 mg by mouth., Disp: , Rfl:  .  tinidazole (TINDAMAX) 500 MG tablet, Take 2 tablets (1,000 mg total) by mouth daily with breakfast., Disp: 10 tablet, Rfl: 2 Allergies  Allergen Reactions  . Codeine Hives and Nausea And Vomiting  . Tramadol Diarrhea and Nausea And Vomiting    Social History   Tobacco Use  . Smoking status: Former Smoker    Packs/day: 0.10    Years: 1.00    Pack years: 0.10    Types: Cigarettes    Last attempt to quit: 10/09/1999    Years since quitting: 18.2  . Smokeless tobacco: Never Used  Substance Use Topics  . Alcohol use: Yes    Comment: 1 glass of white wine  monthly    Family History  Problem Relation Age of Onset  . Diabetes Father   . Early death Father   . Diabetes Mother   . COPD Mother   . Rheum arthritis Mother   . Hyperlipidemia Brother   . Thyroid disease Brother   . Alcohol abuse Neg Hx   . Cancer Neg Hx   . Depression Neg Hx   . Drug abuse Neg Hx   . Hearing loss Neg Hx   . Heart disease Neg Hx   . Hypertension Neg Hx   . Stroke Neg Hx       Review of Systems  Constitutional: negative for fatigue and weight loss Respiratory: negative for cough and wheezing Cardiovascular: negative for chest pain, fatigue and palpitations Gastrointestinal: negative for abdominal pain and change in bowel habits Musculoskeletal:negative for myalgias Neurological: negative for gait problems and tremors Behavioral/Psych: negative for abusive relationship, depression Endocrine: negative for temperature intolerance    Genitourinary:negative for abnormal menstrual periods, genital lesions, hot flashes, sexual problems and vaginal discharge Integument/breast: negative for breast lump, breast tenderness, nipple discharge and skin lesion(s)    Objective:       BP 136/85   Pulse 74   Ht 5' 5"  (1.651 m)   Wt (!) 313 lb 6.4 oz (142.2 kg)   LMP 12/27/2017 (Exact Date)   BMI 52.15 kg/m  General:   alert  Skin:   no rash or abnormalities  Lungs:   clear to auscultation bilaterally  Heart:   regular rate and rhythm, S1, S2 normal, no murmur, click, rub or gallop  Breasts:   normal without suspicious masses, skin or nipple changes or axillary nodes  Abdomen:  normal findings: no organomegaly, soft, non-tender and no hernia  Pelvis:  External genitalia: normal general appearance Urinary system: urethral meatus normal and bladder without fullness, nontender Vaginal: normal without tenderness, induration or masses Cervix: normal appearance Adnexa: normal bimanual exam Uterus: anteverted and non-tender, normal size   Lab Review Urine  pregnancy test Labs reviewed yes Radiologic studies reviewed yes  50% of 20 min visit spent on counseling and coordination of care.   Assessment:     1. Encounter for routine gynecological examination with Papanicolaou smear of cervix   2. Encounter for surveillance of contraceptive pills Rx: - Cytology - PAP - Cervicovaginal ancillary only - norgestimate-ethinyl estradiol (SPRINTEC 28) 0.25-35 MG-MCG tablet; Take 1 tablet by mouth daily.  Dispense: 1 Package; Refill: 12  3. Dysmenorrhea Rx: - ibuprofen (ADVIL,MOTRIN) 800 MG tablet; Take 1 tablet (800 mg total) by mouth every 8 (eight) hours as needed.  Dispense: 30 tablet; Refill: 5  4. BV (bacterial vaginosis) Rx: -  tinidazole (TINDAMAX) 500 MG tablet; Take 2 tablets (1,000 mg total) by mouth daily with breakfast.  Dispense: 10 tablet; Refill: 2  5. Screening breast examination Rx: - MM DIGITAL SCREENING BILATERAL; Future - MM DIAG BREAST TOMO BILATERAL; Future    Plan:    Education reviewed: calcium supplements, depression evaluation, low fat, low cholesterol diet, safe sex/STD prevention, self breast exams and weight bearing exercise. Contraception: OCP (estrogen/progesterone). Mammogram ordered. Follow up in: 1 year.   Meds ordered this encounter  Medications  . norgestimate-ethinyl estradiol (SPRINTEC 28) 0.25-35 MG-MCG tablet    Sig: Take 1 tablet by mouth daily.    Dispense:  1 Package    Refill:  12  . ibuprofen (ADVIL,MOTRIN) 800 MG tablet    Sig: Take 1 tablet (800 mg total) by mouth every 8 (eight) hours as needed.    Dispense:  30 tablet    Refill:  5  . tinidazole (TINDAMAX) 500 MG tablet    Sig: Take 2 tablets (1,000 mg total) by mouth daily with breakfast.    Dispense:  10 tablet    Refill:  2   Orders Placed This Encounter  Procedures  . MM DIGITAL SCREENING BILATERAL    Standing Status:   Future    Standing Expiration Date:   03/10/2019    Order Specific Question:   Reason for Exam (SYMPTOM   OR DIAGNOSIS REQUIRED)    Answer:   sCREENING    Order Specific Question:   Is the patient pregnant?    Answer:   No    Order Specific Question:   Preferred imaging location?    Answer:   [facility]  . MM DIAG BREAST TOMO BILATERAL    Standing Status:   Future    Standing Expiration Date:   03/10/2019    Order Specific Question:   Reason for Exam (SYMPTOM  OR DIAGNOSIS REQUIRED)    Answer:   SCREENING    Order Specific Question:   Is the patient pregnant?    Answer:   No    Order Specific Question:   Preferred imaging location?    Answer:   [facility]    Shelly Bombard MD 01-07-2018

## 2018-01-07 NOTE — Progress Notes (Signed)
Presents for AEX/PAP, wants STD Testing.

## 2018-01-08 LAB — CERVICOVAGINAL ANCILLARY ONLY
Bacterial vaginitis: NEGATIVE
CHLAMYDIA, DNA PROBE: NEGATIVE
Candida vaginitis: NEGATIVE
Neisseria Gonorrhea: NEGATIVE
Trichomonas: NEGATIVE

## 2018-01-09 LAB — CYTOLOGY - PAP
Diagnosis: NEGATIVE
HPV: NOT DETECTED

## 2018-03-13 ENCOUNTER — Other Ambulatory Visit: Payer: Self-pay | Admitting: Internal Medicine

## 2018-03-13 ENCOUNTER — Other Ambulatory Visit: Payer: Self-pay

## 2018-03-13 DIAGNOSIS — I10 Essential (primary) hypertension: Secondary | ICD-10-CM

## 2018-03-26 ENCOUNTER — Other Ambulatory Visit: Payer: Self-pay | Admitting: Obstetrics

## 2018-03-26 DIAGNOSIS — Z3041 Encounter for surveillance of contraceptive pills: Secondary | ICD-10-CM

## 2018-03-31 ENCOUNTER — Other Ambulatory Visit (INDEPENDENT_AMBULATORY_CARE_PROVIDER_SITE_OTHER): Payer: 59

## 2018-03-31 ENCOUNTER — Encounter: Payer: Self-pay | Admitting: Internal Medicine

## 2018-03-31 ENCOUNTER — Ambulatory Visit (INDEPENDENT_AMBULATORY_CARE_PROVIDER_SITE_OTHER): Payer: 59 | Admitting: Internal Medicine

## 2018-03-31 ENCOUNTER — Telehealth: Payer: Self-pay

## 2018-03-31 VITALS — BP 118/78 | HR 94 | Temp 98.0°F | Resp 16 | Ht 65.0 in | Wt 309.0 lb

## 2018-03-31 DIAGNOSIS — R7303 Prediabetes: Secondary | ICD-10-CM | POA: Diagnosis not present

## 2018-03-31 DIAGNOSIS — I10 Essential (primary) hypertension: Secondary | ICD-10-CM

## 2018-03-31 DIAGNOSIS — Z0000 Encounter for general adult medical examination without abnormal findings: Secondary | ICD-10-CM | POA: Diagnosis not present

## 2018-03-31 DIAGNOSIS — R002 Palpitations: Secondary | ICD-10-CM | POA: Insufficient documentation

## 2018-03-31 DIAGNOSIS — Z1231 Encounter for screening mammogram for malignant neoplasm of breast: Secondary | ICD-10-CM

## 2018-03-31 LAB — COMPREHENSIVE METABOLIC PANEL
ALT: 17 U/L (ref 0–35)
AST: 16 U/L (ref 0–37)
Albumin: 4 g/dL (ref 3.5–5.2)
Alkaline Phosphatase: 61 U/L (ref 39–117)
BUN: 13 mg/dL (ref 6–23)
CHLORIDE: 102 meq/L (ref 96–112)
CO2: 27 mEq/L (ref 19–32)
CREATININE: 0.89 mg/dL (ref 0.40–1.20)
Calcium: 9.1 mg/dL (ref 8.4–10.5)
GFR: 88.97 mL/min (ref >60.00)
GLUCOSE: 96 mg/dL (ref 70–99)
Potassium: 3.8 mEq/L (ref 3.5–5.1)
Sodium: 138 mEq/L (ref 135–145)
TOTAL PROTEIN: 7 g/dL (ref 6.0–8.3)
Total Bilirubin: 0.3 mg/dL (ref 0.2–1.2)

## 2018-03-31 LAB — CBC WITH DIFFERENTIAL/PLATELET
BASOS ABS: 0.1 10*3/uL (ref 0.0–0.1)
Basophils Relative: 0.6 % (ref 0.0–3.0)
EOS ABS: 0.2 10*3/uL (ref 0.0–0.7)
Eosinophils Relative: 1.8 % (ref 0.0–5.0)
HEMATOCRIT: 37.7 % (ref 36.0–46.0)
Hemoglobin: 12.4 g/dL (ref 12.0–15.0)
LYMPHS ABS: 3.3 10*3/uL (ref 0.7–4.0)
LYMPHS PCT: 32 % (ref 12.0–46.0)
MCHC: 32.8 g/dL (ref 30.0–36.0)
MCV: 77.4 fl — AB (ref 78.0–100.0)
MONOS PCT: 8 % (ref 3.0–12.0)
Monocytes Absolute: 0.8 10*3/uL (ref 0.1–1.0)
NEUTROS ABS: 6 10*3/uL (ref 1.4–7.7)
NEUTROS PCT: 57.6 % (ref 43.0–77.0)
PLATELETS: 343 10*3/uL (ref 150.0–400.0)
RBC: 4.87 Mil/uL (ref 3.87–5.11)
RDW: 14.4 % (ref 11.5–15.5)
WBC: 10.3 10*3/uL (ref 4.0–10.5)

## 2018-03-31 LAB — LIPID PANEL
CHOL/HDL RATIO: 3
Cholesterol: 130 mg/dL (ref 0–200)
HDL: 42.6 mg/dL (ref >39.00)
LDL CALC: 62 mg/dL (ref 0–99)
NonHDL: 87.41
Triglycerides: 125 mg/dL (ref 0.0–149.0)
VLDL: 25 mg/dL (ref 0.0–40.0)

## 2018-03-31 LAB — HEMOGLOBIN A1C: Hgb A1c MFr Bld: 6 % (ref 4.6–6.5)

## 2018-03-31 LAB — TSH: TSH: 1.77 u[IU]/mL (ref 0.35–4.50)

## 2018-03-31 MED ORDER — LIRAGLUTIDE -WEIGHT MANAGEMENT 18 MG/3ML ~~LOC~~ SOPN: 3.0000 mg | PEN_INJECTOR | Freq: Every day | SUBCUTANEOUS | 5 refills | Status: DC

## 2018-03-31 NOTE — Patient Instructions (Signed)
Palpitations A palpitation is the feeling that your heartbeat is irregular or is faster than normal. It may feel like your heart is fluttering or skipping a beat. Palpitations are usually not a serious problem. They may be caused by many things, including smoking, caffeine, alcohol, stress, and certain medicines. Although most causes of palpitations are not serious, palpitations can be a sign of a serious medical problem. In some cases, you may need further medical evaluation. Follow these instructions at home: Pay attention to any changes in your symptoms. Take these actions to help with your condition:  Avoid the following: ? Caffeinated coffee, tea, soft drinks, diet pills, and energy drinks. ? Chocolate. ? Alcohol.  Do not use any tobacco products, such as cigarettes, chewing tobacco, and e-cigarettes. If you need help quitting, ask your health care provider.  Try to reduce your stress and anxiety. Things that can help you relax include: ? Yoga. ? Meditation. ? Physical activity, such as swimming, jogging, or walking. ? Biofeedback. This is a method that helps you learn to use your mind to control things in your body, such as your heartbeats.  Get plenty of rest and sleep.  Take over-the-counter and prescription medicines only as told by your health care provider.  Keep all follow-up visits as told by your health care provider. This is important.  Contact a health care provider if:  You continue to have a fast or irregular heartbeat after 24 hours.  Your palpitations occur more often. Get help right away if:  You have chest pain or shortness of breath.  You have a severe headache.  You feel dizzy or you faint. This information is not intended to replace advice given to you by your health care provider. Make sure you discuss any questions you have with your health care provider. Document Released: 09/21/2000 Document Revised: 02/27/2016 Document Reviewed: 06/09/2015 Elsevier  Interactive Patient Education  [employer].

## 2018-03-31 NOTE — Telephone Encounter (Signed)
Key: [id number]

## 2018-03-31 NOTE — Progress Notes (Signed)
Subjective:  Patient ID: Alicia Hoover, female    DOB: Oct 26, 1975  Age: 42 y.o. MRN: [id number]  CC: Annual Exam and Hypertension   HPI Alicia Hoover presents for a CPX.  She complains of a several month history of feeling like her heart is fluttering.  She describes a sensation of extra beats that lasts for several minutes.  She feels nervous during these episodes and has to sit down but she denies dizziness, lightheadedness, chest pain, dyspnea on exertion, or near syncope.  She says this happens about once a week.  She also complains that she is not losing much weight with Contrave and that it is too expensive.  Outpatient Medications Prior to Visit  Medication Sig Dispense Refill  . benazepril-hydrochlorthiazide (LOTENSIN HCT) 20-12.5 MG tablet Take 1 tablet by mouth daily. 30 tablet 0  . fluticasone (FLONASE) 50 MCG/ACT nasal spray Place 2 sprays into the nose daily as needed for allergies.     Marland Kitchen ibuprofen (ADVIL,MOTRIN) 800 MG tablet Take 1 tablet (800 mg total) by mouth every 8 (eight) hours as needed. 30 tablet 5  . Multiple Vitamin (MULTIVITAMIN) tablet Take 1 tablet by mouth daily.    . Omega-3 Fatty Acids (FISH OIL) 1360 MG CAPS Take 1,500 mg by mouth.    Marland Kitchen omeprazole (PRILOSEC) 40 MG capsule Take 40 mg by mouth daily.    . SPRINTEC 28 0.25-35 MG-MCG tablet TAKE 1 TABLET BY MOUTH EVERY DAY 84 tablet 5  . APPLE CIDER VINEGAR PO Take 1 capsule by mouth daily.    Marland Kitchen BIOTIN PO Take 1 capsule by mouth daily.    Marland Kitchen CONTRAVE 8-90 MG TB12 TAKE 2 TABLETS BY MOUTH 2 (TWO) TIMES DAILY. 360 tablet 1  . ondansetron (ZOFRAN) 4 MG tablet Take 1 tablet (4 mg total) by mouth every 8 (eight) hours as needed for nausea or vomiting. 20 tablet 0  . tinidazole (TINDAMAX) 500 MG tablet Take 2 tablets (1,000 mg total) by mouth daily with breakfast. 10 tablet 2  . doxepin (SINEQUAN) 10 MG capsule TAKE 1 CAPSULE (10 MG TOTAL) BY MOUTH AT BEDTIME. (Patient not taking: Reported on 01/07/2018) 90  capsule 3  . losartan-hydrochlorothiazide (HYZAAR) 100-12.5 MG tablet Take 1 tablet by mouth daily. (Patient not taking: Reported on 01/07/2018) 90 tablet 1   No facility-administered medications prior to visit.     ROS Review of Systems  Constitutional: Negative.  Negative for appetite change, diaphoresis, fatigue and unexpected weight change.  HENT: Negative.  Negative for trouble swallowing.   Eyes: Negative for visual disturbance.  Respiratory: Positive for apnea. Negative for cough, chest tightness, shortness of breath and wheezing.   Cardiovascular: Positive for palpitations.  Gastrointestinal: Negative for abdominal pain, constipation, diarrhea, nausea and vomiting.  Endocrine: Negative.   Genitourinary: Negative.  Negative for difficulty urinating.  Musculoskeletal: Negative.  Negative for arthralgias, back pain, myalgias and neck pain.  Skin: Negative.  Negative for color change, pallor and rash.  Allergic/Immunologic: Negative.   Neurological: Negative.  Negative for dizziness, weakness and light-headedness.  Hematological: Negative for adenopathy. Does not bruise/bleed easily.  Psychiatric/Behavioral: Negative.     Objective:  BP 118/78 (BP Location: Left Arm, Patient Position: Sitting, Cuff Size: Large)   Pulse 94   Temp 98 F (36.7 C) (Oral)   Resp 16   Ht 5' 5"  (1.651 m)   Wt (!) 309 lb (140.2 kg)   SpO2 92%   BMI 51.42 kg/m   BP Readings from Last 3  Encounters:  03/31/18 118/78  01/07/18 136/85  04/04/17 136/84    Wt Readings from Last 3 Encounters:  03/31/18 (!) 309 lb (140.2 kg)  01/07/18 (!) 313 lb 6.4 oz (142.2 kg)  04/04/17 (!) 307 lb (139.3 kg)    Physical Exam  Constitutional: She is oriented to person, place, and time. No distress.  HENT:  Mouth/Throat: Oropharynx is clear and moist. No oropharyngeal exudate.  Eyes: Conjunctivae are normal. No scleral icterus.  Neck: Normal range of motion. Neck supple. No JVD present. No thyromegaly present.    Cardiovascular: Normal rate, regular rhythm and normal heart sounds. Exam reveals no gallop.  No murmur heard. EKG --  Sinus  Rhythm  WITHIN NORMAL LIMITS  Pulmonary/Chest: Effort normal and breath sounds normal. She has no wheezes. She has no rhonchi. She has no rales.  Abdominal: Soft. Normal appearance and bowel sounds are normal. She exhibits no mass. There is no hepatosplenomegaly. There is no tenderness.  Musculoskeletal: Normal range of motion. She exhibits no edema, tenderness or deformity.  Lymphadenopathy:    She has no cervical adenopathy.  Neurological: She is alert and oriented to person, place, and time.  Skin: Skin is warm and dry. She is not diaphoretic. No pallor.  Psychiatric: She has a normal mood and affect. Her behavior is normal. Judgment and thought content normal.  Vitals reviewed.   Lab Results  Component Value Date   WBC 10.3 03/31/2018   HGB 12.4 03/31/2018   HCT 37.7 03/31/2018   PLT 343.0 03/31/2018   GLUCOSE 96 03/31/2018   CHOL 130 03/31/2018   TRIG 125.0 03/31/2018   HDL 42.60 03/31/2018   LDLDIRECT 57.0 03/27/2017   LDLCALC 62 03/31/2018   ALT 17 03/31/2018   AST 16 03/31/2018   NA 138 03/31/2018   K 3.8 03/31/2018   CL 102 03/31/2018   CREATININE 0.89 03/31/2018   BUN 13 03/31/2018   CO2 27 03/31/2018   TSH 1.77 03/31/2018   HGBA1C 6.0 03/31/2018    Mr Knee Left  Wo Contrast  Result Date: 04/28/2017 CLINICAL DATA:  Left knee pain over the last 6 months with popping. EXAM: MRI OF THE LEFT KNEE WITHOUT CONTRAST TECHNIQUE: Multiplanar, multisequence MR imaging of the knee was performed. No intravenous contrast was administered. COMPARISON:  03/27/2017 FINDINGS: MENISCI Medial meniscus: Linear grade 2 signal in the midbody of the medial meniscus extends towards but does not definitively reach the inferior meniscal surface. Lateral meniscus:  Unremarkable LIGAMENTS Cruciates:  Unremarkable Collaterals: Mild edema tracks adjacent to the MCL.  This can be incidental but in the appropriate clinical circumstance could represent grade 1 sprain. CARTILAGE Patellofemoral: Mild chondral fissuring along the medial patellar facet. Moderate chondral thinning along the lateral patellar facet. Medial:  Mild degenerative chondral thinning. Lateral:  Unremarkable Joint: Small knee effusion potentially with mild synovitis. There is edema in Hoffa's fat pad just below the lateral patellar facet. Popliteal Fossa: Moderate size Baker's cyst with extensive surrounding edema, probably partially ruptured. Extensor Mechanism: Tibial tubercle -trochlear groove distance 1.5 cm. Prepatellar subcutaneous edema. Bones: No significant extra-articular osseous abnormalities identified. Other: No supplemental non-categorized findings. IMPRESSION: 1. Moderate-sized Baker's cyst with considerable surrounding edema, suspicion for partially ruptured Baker's cyst. 2. Small knee effusion with mild synovitis. 3. 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 4. Prepatellar subcutaneous edema. 5. Chondral thinning along the lateral patellar facet and in the medial compartment. Mild chondral  fissuring along the medial patellar facet. 6. Mild edema tracks adjacent to the MCL. This can be incidental but in the appropriate clinical circumstance could represent grade 1 sprain. Electronically Signed   By: Van Clines M.D.   On: 04/28/2017 17:56    Assessment & Plan:   Nil was seen today for annual exam and hypertension.  Diagnoses and all orders for this visit:  Essential hypertension, benign- Her blood pressure is well controlled.  Electrolytes and renal function are normal.  Will continue the current combination of an ACE inhibitor and thiazide diuretic. -     CBC with Differential/Platelet; Future -     Comprehensive metabolic panel; Future -     TSH;  Future  Prediabetes- Her A1c is at 6.0%.  I will start treating the obesity with high-dose GLP-1 agonist. -     Comprehensive metabolic panel; Future -     Hemoglobin A1c; Future  Routine general medical examination at a health care facility- She tells me that her gynecologist has ordered her mammogram.  Labs reviewed today, vaccines reviewed, Pap smear is up-to-date, patient education material was given. -     Lipid panel; Future  Visit for screening mammogram  Morbid obesity ([place])- She will start a high-dose GLP-1 agonist.  She was educated regarding the dose escalation of Saxenda. -     Liraglutide -Weight Management (SAXENDA) 18 MG/3ML SOPN; Inject 3 mg into the skin daily.  Rapid palpitations- Her EKG is normal today.  Her lab work is negative for secondary causes.  Due to her obesity and sleep apnea she is high risk for dysrhythmia such as atrial fibrillation's of asked her to undergo a cardiac event monitor. -     EKG 12-Lead -     CARDIAC EVENT MONITOR; Future   I have discontinued Kierah L. Ybarra's BIOTIN PO, APPLE CIDER VINEGAR PO, ondansetron, doxepin, losartan-hydrochlorothiazide, CONTRAVE, and tinidazole. I am also having her start on Liraglutide -Weight Management. Additionally, I am having her maintain her fluticasone, omeprazole, Fish Oil, multivitamin, ibuprofen, benazepril-hydrochlorthiazide, and SPRINTEC 28.  Meds ordered this encounter  Medications  . Liraglutide -Weight Management (SAXENDA) 18 MG/3ML SOPN    Sig: Inject 3 mg into the skin daily.    Dispense:  5 pen    Refill:  5     Follow-up: Return in about 6 weeks (around 05/12/2018).  Scarlette Calico, MD

## 2018-04-02 NOTE — Telephone Encounter (Signed)
PA was approved. 

## 2018-04-15 ENCOUNTER — Ambulatory Visit (INDEPENDENT_AMBULATORY_CARE_PROVIDER_SITE_OTHER): Payer: 59

## 2018-04-15 DIAGNOSIS — R002 Palpitations: Secondary | ICD-10-CM | POA: Diagnosis not present

## 2018-04-16 MED ORDER — PEN NEEDLES 31G X 6 MM MISC: 2 refills | Status: DC

## 2018-04-16 NOTE — Addendum Note (Signed)
Addended by: Aviva Signs M on: 04/16/2018 03:06 PM   Modules accepted: Orders

## 2018-05-20 ENCOUNTER — Encounter: Payer: Self-pay | Admitting: Internal Medicine

## 2018-05-20 ENCOUNTER — Other Ambulatory Visit: Payer: Self-pay | Admitting: Internal Medicine

## 2018-05-20 DIAGNOSIS — R002 Palpitations: Secondary | ICD-10-CM

## 2018-07-22 ENCOUNTER — Other Ambulatory Visit: Payer: Self-pay | Admitting: Internal Medicine

## 2018-07-22 DIAGNOSIS — I10 Essential (primary) hypertension: Secondary | ICD-10-CM

## 2018-07-27 ENCOUNTER — Other Ambulatory Visit: Payer: Self-pay | Admitting: Obstetrics

## 2018-07-27 DIAGNOSIS — N946 Dysmenorrhea, unspecified: Secondary | ICD-10-CM

## 2018-07-28 ENCOUNTER — Telehealth: Payer: Self-pay

## 2018-07-28 NOTE — Telephone Encounter (Signed)
There is a PA for saxenda but pt has not been seen since June and I am not able to determine if pt has been successful on the current dose.

## 2018-07-28 NOTE — Telephone Encounter (Signed)
Left detailed message for pt to call back.

## 2018-07-29 ENCOUNTER — Ambulatory Visit: Payer: 59 | Admitting: Cardiology

## 2018-07-30 ENCOUNTER — Encounter

## 2018-07-30 ENCOUNTER — Encounter: Payer: Self-pay | Admitting: Internal Medicine

## 2018-07-30 ENCOUNTER — Ambulatory Visit (INDEPENDENT_AMBULATORY_CARE_PROVIDER_SITE_OTHER): Payer: 59 | Admitting: Internal Medicine

## 2018-07-30 VITALS — BP 132/86 | HR 82 | Ht 65.0 in | Wt 300.2 lb

## 2018-07-30 DIAGNOSIS — I10 Essential (primary) hypertension: Secondary | ICD-10-CM

## 2018-07-30 DIAGNOSIS — G4733 Obstructive sleep apnea (adult) (pediatric): Secondary | ICD-10-CM

## 2018-07-30 DIAGNOSIS — R0602 Shortness of breath: Secondary | ICD-10-CM | POA: Diagnosis not present

## 2018-07-30 DIAGNOSIS — R002 Palpitations: Secondary | ICD-10-CM | POA: Diagnosis not present

## 2018-07-30 NOTE — Progress Notes (Signed)
Electrophysiology Office Note   Date:  07/30/2018   ID:  Alicia Hoover, DOB Apr 23, 1976, MRN [id number]  PCP:  [name], MD    Primary Electrophysiologist: Thompson Grayer, MD    CC: palpitations   History of Present Illness: Alicia Hoover is a 42 y.o. female who presents today for electrophysiology evaluation.   She is referred by Dr Ronnald Ramp for EP consultation regarding palpitations.  She has had "heart fluttering" for about 6 months.  She reports abrupt onset/offset of episodes, lasting only a couple seconds.  She reports associated nervousness.  She was evaluated by PCP and had an event monitor placed.  This documented PACs and PVCs with rare couplets as the cause for symptoms of "fluttering". She reports having her symptoms while wearing the monitor.  She feels "uncomfortable" during these episodes.  She is reasonably active.  + SOB with moderate activity.  Today, she denies symptoms of chest pain, orthopnea, PND, lower extremity edema, claudication, dizziness, presyncope, syncope, bleeding, or neurologic sequela. The patient is tolerating medications without difficulties and is otherwise without complaint today.    Past Medical History:  Diagnosis Date  . Asthma   . GERD (gastroesophageal reflux disease)   . Hypersomnia with sleep apnea   . Hypertension   . Migraine   . Rhinitis    Past Surgical History:  Procedure Laterality Date  . CHOLECYSTECTOMY    . TOOTH EXTRACTION       Current Outpatient Medications  Medication Sig Dispense Refill  . benazepril-hydrochlorthiazide (LOTENSIN HCT) 20-12.5 MG tablet TAKE 1 TABLET BY MOUTH EVERY DAY 90 tablet 0  . fluticasone (FLONASE) 50 MCG/ACT nasal spray Place 2 sprays into the nose daily as needed for allergies.     Marland Kitchen ibuprofen (ADVIL,MOTRIN) 800 MG tablet TAKE 1 TABLET BY MOUTH EVERY 8 HOURS AS NEEDED 30 tablet 5  . Insulin Pen Needle (PEN NEEDLES) 31G X 6 MM MISC Use daily to inject saxenda. 30 each 2  . Liraglutide  -Weight Management (SAXENDA) 18 MG/3ML SOPN Inject 3 mg into the skin daily. 5 pen 5  . Multiple Vitamin (MULTIVITAMIN) tablet Take 1 tablet by mouth daily.    . Omega-3 Fatty Acids (FISH OIL) 1360 MG CAPS Take 1,500 mg by mouth.    Marland Kitchen omeprazole (PRILOSEC) 40 MG capsule Take 40 mg by mouth daily.    . SPRINTEC 28 0.25-35 MG-MCG tablet TAKE 1 TABLET BY MOUTH EVERY DAY 84 tablet 5  . tinidazole (TINDAMAX) 500 MG tablet Take 1 tablet by mouth as needed.  2   No current facility-administered medications for this visit.     Allergies:   Codeine and Tramadol   Social History:  The patient  reports that she quit smoking about 18 years ago. Her smoking use included cigarettes. She has a 0.10 pack-year smoking history. She has never used smokeless tobacco. She reports that she drinks alcohol. She reports that she does not use drugs.   Family History:  The patient's  family history includes COPD in her mother; Diabetes in her father and mother; Early death in her father; Hyperlipidemia in her brother; Rheum arthritis in her mother; Thyroid disease in her brother.  Mother Maebelle Munroe) is my patient and has CAD/afib.  ROS:  Please see the history of present illness.   All other systems are personally reviewed and negative.    PHYSICAL EXAM: VS:  BP 132/86   Pulse 82   Ht 5' 5"  (1.651 m)   Wt Marland Kitchen)  300 lb 3.2 oz (136.2 kg)   SpO2 99%   BMI 49.96 kg/m  , BMI Body mass index is 49.96 kg/m. GEN: Well nourished, well developed, in no acute distress  HEENT: normal  Neck: no JVD, carotid bruits, or masses Cardiac: RRR; no murmurs, rubs, or gallops,no edema  Respiratory:  clear to auscultation bilaterally, normal work of breathing GI: soft, nontender, nondistended, + BS MS: no deformity or atrophy  Skin: warm and dry  Neuro:  Strength and sensation are intact Psych: euthymic mood, full affect  EKG:  EKG is ordered today. The ekg ordered today is personally reviewed and shows sinus rhythm with  premature junctional beats, 82 bpm, PR 164 msec, QRS 78 msec, Qtc 406 msec   Recent Labs: 03/31/2018: ALT 17; BUN 13; Creatinine, Ser 0.89; Hemoglobin 12.4; Platelets 343.0; Potassium 3.8; Sodium 138; TSH 1.77  personally reviewed   Lipid Panel     Component Value Date/Time   CHOL 130 03/31/2018 1004   TRIG 125.0 03/31/2018 1004   HDL 42.60 03/31/2018 1004   CHOLHDL 3 03/31/2018 1004   VLDL 25.0 03/31/2018 1004   LDLCALC 62 03/31/2018 1004   LDLDIRECT 57.0 03/27/2017 1511   personally reviewed   Wt Readings from Last 3 Encounters:  07/30/18 (!) 300 lb 3.2 oz (136.2 kg)  03/31/18 (!) 309 lb (140.2 kg)  01/07/18 (!) 313 lb 6.4 oz (142.2 kg)      Other studies personally reviewed: Additional studies/ records that were reviewed today include: Dr Ronnald Ramp' note, recent holter  Review of the above records today demonstrates: as above   ASSESSMENT AND PLAN:  1.  Palpitations I have reviewed her event monitor personally which reveals only PACs, PVCs. Will order echo to evaluate for structural heart disease We discussed lifestyle modification including regular exercise, weight reduction, caffeine avoidance, and yoga as options. Could consider nadolol if needed Pt reassured  2. SOB Likely due to overweight Echo, exercise treadmill to evaluate for cardiac cause  3. Obesity Body mass index is 49.96 kg/m. Lifestyle modification discussed at length  4. OSA Not compliant with CPAP.  I have advised compliance  5. HTN Stable No change required today   Follow-up:  Return in 6 weeks  Current medicines are reviewed at length with the patient today.   The patient does not have concerns regarding her medicines.  The following changes were made today:  none    Signed, Thompson Grayer, MD  07/30/2018 11:15 AM     [facility] [address] [place] [address] [phone] (office) [phone] (fax)

## 2018-07-30 NOTE — Patient Instructions (Addendum)
Medication Instructions:  Your physician recommends that you continue on your current medications as directed. Please refer to the Current Medication list given to you today.  If you need a refill on your cardiac medications before your next appointment, please call your pharmacy.   Lab work: None ordered.  If you have labs (blood work) drawn today and your tests are completely normal, you will receive your results only by: Marland Kitchen MyChart Message (if you have MyChart) OR . A paper copy in the mail If you have any lab test that is abnormal or we need to change your treatment, we will call you to review the results.  Testing/Procedures: Your physician has requested that you have an echocardiogram. Echocardiography is a painless test that uses sound waves to create images of your heart. It provides your doctor with information about the size and shape of your heart and how well your heart's chambers and valves are working. This procedure takes approximately one hour. There are no restrictions for this procedure.  Please schedule for ECHO  Your physician has requested that you have an exercise tolerance test. For further information please visit [email or web address]. Please also follow instruction sheet, as given.  Please schedule for an exercise treadmill test.  Follow-Up:  Your physician wants you to follow-up in: 6 weeks with Dr. Rayann Heman.    At [facility], you and your health needs are our priority.  As part of our continuing mission to provide you with exceptional heart care, we have created designated Provider Care Teams.  These Care Teams include your primary Cardiologist (physician) and Advanced Practice Providers (APPs -  Physician Assistants and Nurse Practitioners) who all work together to provide you with the care you need, when you need it.  Any Other Special Instructions Will Be Listed Below (If Applicable).

## 2018-07-31 NOTE — Telephone Encounter (Signed)
Pt returned call. She would like a return call back. Pt says that she is also at work and may not be able to answer. Please leave a detailed vm.

## 2018-07-31 NOTE — Telephone Encounter (Signed)
Detailed message left for pt to schedule an appt with Jones to follow up so I may complete her PA.

## 2018-08-01 ENCOUNTER — Other Ambulatory Visit: Payer: Self-pay | Admitting: Internal Medicine

## 2018-08-01 DIAGNOSIS — I10 Essential (primary) hypertension: Secondary | ICD-10-CM

## 2018-08-07 ENCOUNTER — Ambulatory Visit (INDEPENDENT_AMBULATORY_CARE_PROVIDER_SITE_OTHER): Payer: 59

## 2018-08-07 ENCOUNTER — Other Ambulatory Visit: Payer: Self-pay

## 2018-08-07 ENCOUNTER — Ambulatory Visit (HOSPITAL_COMMUNITY): Payer: 59 | Attending: Cardiology

## 2018-08-07 DIAGNOSIS — R0602 Shortness of breath: Secondary | ICD-10-CM | POA: Insufficient documentation

## 2018-08-07 DIAGNOSIS — R002 Palpitations: Secondary | ICD-10-CM

## 2018-08-07 LAB — EXERCISE TOLERANCE TEST
CHL CUP RESTING HR STRESS: 81 {beats}/min
CHL RATE OF PERCEIVED EXERTION: 17
Estimated workload: 10.1 METS
Exercise duration (min): 8 min
Exercise duration (sec): 0 s
MPHR: 177 {beats}/min
Peak HR: 157 {beats}/min
Percent HR: 88 %

## 2018-08-12 ENCOUNTER — Telehealth: Payer: Self-pay

## 2018-08-12 NOTE — Telephone Encounter (Signed)
LMTCB

## 2018-08-12 NOTE — Telephone Encounter (Signed)
LMTCB re: stress test

## 2018-08-12 NOTE — Telephone Encounter (Signed)
-----   Message from Thompson Grayer, MD sent at 08/12/2018  7:50 AM EST ----- Results reviewed.  Sonia Baller, please inform pt of result. I will route to primary care also.

## 2018-08-12 NOTE — Telephone Encounter (Signed)
-----   Message from Thompson Grayer, MD sent at 08/12/2018  7:52 AM EST ----- Results reviewed.  Sonia Baller, please inform pt of result. I will route to primary care also.

## 2018-08-18 NOTE — Telephone Encounter (Signed)
Results released to [facility].  No further action needed.

## 2018-08-25 NOTE — Telephone Encounter (Signed)
Pt is schedule for 12/3 with Dr Ronnald Ramp

## 2018-08-27 ENCOUNTER — Other Ambulatory Visit: Payer: Self-pay | Admitting: Obstetrics

## 2018-08-27 DIAGNOSIS — N760 Acute vaginitis: Principal | ICD-10-CM

## 2018-08-27 DIAGNOSIS — B9689 Other specified bacterial agents as the cause of diseases classified elsewhere: Secondary | ICD-10-CM

## 2018-09-09 ENCOUNTER — Ambulatory Visit (INDEPENDENT_AMBULATORY_CARE_PROVIDER_SITE_OTHER): Payer: 59 | Admitting: Internal Medicine

## 2018-09-09 ENCOUNTER — Encounter: Payer: Self-pay | Admitting: Internal Medicine

## 2018-09-09 VITALS — BP 132/70 | HR 70 | Temp 98.9°F | Resp 18 | Ht 65.0 in | Wt 306.8 lb

## 2018-09-09 DIAGNOSIS — I10 Essential (primary) hypertension: Secondary | ICD-10-CM

## 2018-09-09 DIAGNOSIS — Z1231 Encounter for screening mammogram for malignant neoplasm of breast: Secondary | ICD-10-CM

## 2018-09-09 MED ORDER — LIRAGLUTIDE -WEIGHT MANAGEMENT 18 MG/3ML ~~LOC~~ SOPN: 3.0000 mg | PEN_INJECTOR | Freq: Every day | SUBCUTANEOUS | 5 refills | Status: DC

## 2018-09-09 NOTE — Progress Notes (Signed)
Subjective:  Patient ID: Alicia Hoover, female    DOB: May 16, 1976  Age: 42 y.o. MRN: [id number]  CC: Hypertension   HPI [name] presents for a BP check - She tells me that her blood pressure has been well controlled on the combination of an ACE inhibitor and thiazide diuretic.  She has felt well recently and offers no complaints.  She wants to keep using the GLP-1 agonist to help her lose weight.  Outpatient Medications Prior to Visit  Medication Sig Dispense Refill  . benazepril-hydrochlorthiazide (LOTENSIN HCT) 20-12.5 MG tablet TAKE 1 TABLET BY MOUTH EVERY DAY 90 tablet 0  . fluticasone (FLONASE) 50 MCG/ACT nasal spray Place 2 sprays into the nose daily as needed for allergies.     Marland Kitchen ibuprofen (ADVIL,MOTRIN) 800 MG tablet TAKE 1 TABLET BY MOUTH EVERY 8 HOURS AS NEEDED 30 tablet 5  . Insulin Pen Needle (PEN NEEDLES) 31G X 6 MM MISC Use daily to inject saxenda. 30 each 2  . Multiple Vitamin (MULTIVITAMIN) tablet Take 1 tablet by mouth daily.    . Omega-3 Fatty Acids (FISH OIL) 1360 MG CAPS Take 1,500 mg by mouth.    Marland Kitchen omeprazole (PRILOSEC) 40 MG capsule Take 40 mg by mouth daily.    . SPRINTEC 28 0.25-35 MG-MCG tablet TAKE 1 TABLET BY MOUTH EVERY DAY 84 tablet 5  . tinidazole (TINDAMAX) 500 MG tablet TAKE 2 TABLETS BY MOUTH EVERY DAY WITH BREAKFAST 10 tablet 2  . Liraglutide -Weight Management (SAXENDA) 18 MG/3ML SOPN Inject 3 mg into the skin daily. 5 pen 5  . tinidazole (TINDAMAX) 500 MG tablet Take 1 tablet by mouth as needed.  2   No facility-administered medications prior to visit.     ROS Review of Systems  Constitutional: Negative for diaphoresis and fatigue.  HENT: Negative.   Eyes: Negative for visual disturbance.  Respiratory: Negative for cough, chest tightness, shortness of breath and wheezing.   Cardiovascular: Negative for chest pain, palpitations and leg swelling.  Gastrointestinal: Negative for abdominal pain, constipation, diarrhea, nausea and  vomiting.  Genitourinary: Negative.  Negative for difficulty urinating and urgency.  Musculoskeletal: Negative.   Skin: Negative.   Neurological: Negative.  Negative for dizziness, weakness and light-headedness.  Hematological: Negative for adenopathy. Does not bruise/bleed easily.  Psychiatric/Behavioral: Negative.     Objective:  BP 132/70 (BP Location: Left Arm, Patient Position: Sitting, Cuff Size: Large)   Pulse 70   Temp 98.9 F (37.2 C) (Oral)   Resp 18   Ht 5' 5"  (1.651 m)   Wt (!) 306 lb 12 oz (139.1 kg)   LMP 08/30/2018   SpO2 98%   BMI 51.05 kg/m   BP Readings from Last 3 Encounters:  09/09/18 132/70  07/30/18 132/86  03/31/18 118/78    Wt Readings from Last 3 Encounters:  09/09/18 (!) 306 lb 12 oz (139.1 kg)  07/30/18 (!) 300 lb 3.2 oz (136.2 kg)  03/31/18 (!) 309 lb (140.2 kg)    Physical Exam  Constitutional: She is oriented to person, place, and time. No distress.  HENT:  Mouth/Throat: Oropharynx is clear and moist. No oropharyngeal exudate.  Eyes: Conjunctivae are normal. No scleral icterus.  Neck: Normal range of motion. Neck supple. No JVD present. No thyromegaly present.  Cardiovascular: Normal rate, regular rhythm and normal heart sounds.  No murmur heard. Pulmonary/Chest: Effort normal and breath sounds normal. No respiratory distress. She has no wheezes. She has no rales.  Abdominal: Soft. Bowel sounds are  normal. She exhibits no mass. There is no hepatosplenomegaly. There is no tenderness.  Musculoskeletal: Normal range of motion. She exhibits no edema, tenderness or deformity.  Lymphadenopathy:    She has no cervical adenopathy.  Neurological: She is alert and oriented to person, place, and time.  Skin: Skin is warm and dry. She is not diaphoretic. No pallor.  Vitals reviewed.   Lab Results  Component Value Date   WBC 10.3 03/31/2018   HGB 12.4 03/31/2018   HCT 37.7 03/31/2018   PLT 343.0 03/31/2018   GLUCOSE 96 03/31/2018   CHOL 130  03/31/2018   TRIG 125.0 03/31/2018   HDL 42.60 03/31/2018   LDLDIRECT 57.0 03/27/2017   LDLCALC 62 03/31/2018   ALT 17 03/31/2018   AST 16 03/31/2018   NA 138 03/31/2018   K 3.8 03/31/2018   CL 102 03/31/2018   CREATININE 0.89 03/31/2018   BUN 13 03/31/2018   CO2 27 03/31/2018   TSH 1.77 03/31/2018   HGBA1C 6.0 03/31/2018    Mr Knee Left  Wo Contrast  Result Date: 04/28/2017 CLINICAL DATA:  Left knee pain over the last 6 months with popping. EXAM: MRI OF THE LEFT KNEE WITHOUT CONTRAST TECHNIQUE: Multiplanar, multisequence MR imaging of the knee was performed. No intravenous contrast was administered. COMPARISON:  03/27/2017 FINDINGS: MENISCI Medial meniscus: Linear grade 2 signal in the midbody of the medial meniscus extends towards but does not definitively reach the inferior meniscal surface. Lateral meniscus:  Unremarkable LIGAMENTS Cruciates:  Unremarkable Collaterals: Mild edema tracks adjacent to the MCL. This can be incidental but in the appropriate clinical circumstance could represent grade 1 sprain. CARTILAGE Patellofemoral: Mild chondral fissuring along the medial patellar facet. Moderate chondral thinning along the lateral patellar facet. Medial:  Mild degenerative chondral thinning. Lateral:  Unremarkable Joint: Small knee effusion potentially with mild synovitis. There is edema in Hoffa's fat pad just below the lateral patellar facet. Popliteal Fossa: Moderate size Baker's cyst with extensive surrounding edema, probably partially ruptured. Extensor Mechanism: Tibial tubercle -trochlear groove distance 1.5 cm. Prepatellar subcutaneous edema. Bones: No significant extra-articular osseous abnormalities identified. Other: No supplemental non-categorized findings. IMPRESSION: 1. Moderate-sized Baker's cyst with considerable surrounding edema, suspicion for partially ruptured Baker's cyst. 2. Small knee effusion with mild synovitis. 3. Mild focal synovitis in Hoffa's fat pad just below the  lateral patellar facet, suggesting patellar tendon -lateral femoral condyle friction syndrome. 4. Prepatellar subcutaneous edema. 5. Chondral thinning along the lateral patellar facet and in the medial compartment. Mild chondral fissuring along the medial patellar facet. 6. Mild edema tracks adjacent to the MCL. This can be incidental but in the appropriate clinical circumstance could represent grade 1 sprain. Electronically Signed   By: Van Clines M.D.   On: 04/28/2017 17:56    Assessment & Plan:   Kaileena was seen today for hypertension.  Diagnoses and all orders for this visit:  Essential hypertension, benign- Her blood pressure is well controlled.  I will monitor her electrolytes and renal function. -     CBC with Differential/Platelet; Future -     Basic metabolic panel; Future  Morbid obesity ([place])- Will continue high-dose liraglutide to help her lose weight. -     Liraglutide -Weight Management (SAXENDA) 18 MG/3ML SOPN; Inject 3 mg into the skin daily.  Visit for screening mammogram -     MM DIGITAL SCREENING BILATERAL; Future   I am having Oriyah L. Weisel maintain her fluticasone, omeprazole, Fish Oil, multivitamin, SPRINTEC 28, Pen Needles, benazepril-hydrochlorthiazide,  ibuprofen, tinidazole, and Liraglutide -Weight Management.  Meds ordered this encounter  Medications  . Liraglutide -Weight Management (SAXENDA) 18 MG/3ML SOPN    Sig: Inject 3 mg into the skin daily.    Dispense:  5 pen    Refill:  5     Follow-up: Return in about 6 months (around 03/11/2019).  Scarlette Calico, MD

## 2018-09-09 NOTE — Patient Instructions (Signed)

## 2018-09-10 ENCOUNTER — Ambulatory Visit (INDEPENDENT_AMBULATORY_CARE_PROVIDER_SITE_OTHER): Payer: 59 | Admitting: Internal Medicine

## 2018-09-10 ENCOUNTER — Encounter: Payer: Self-pay | Admitting: Internal Medicine

## 2018-09-10 VITALS — BP 124/76 | HR 94 | Ht 65.0 in | Wt 303.6 lb

## 2018-09-10 DIAGNOSIS — R002 Palpitations: Secondary | ICD-10-CM

## 2018-09-10 DIAGNOSIS — R0602 Shortness of breath: Secondary | ICD-10-CM

## 2018-09-10 MED ORDER — NADOLOL 20 MG PO TABS: 10.0000 mg | ORAL_TABLET | Freq: Every day | ORAL | 3 refills | Status: DC

## 2018-09-10 NOTE — Patient Instructions (Signed)
Medication Instructions:  Your physician has recommended you make the following change in your medication:   Begin Nadolol, 32m, half tablet, once per day.   Labwork: None ordered.  Testing/Procedures: None ordered.  Follow-Up: Your physician recommends that you schedule a follow-up appointment in:   3 months with RDillon Bjork PA   Any Other Special Instructions Will Be Listed Below (If Applicable).     If you need a refill on your cardiac medications before your next appointment, please call your pharmacy.

## 2018-09-10 NOTE — Progress Notes (Signed)
Electrophysiology Office Note Date: 09/10/2018  ID:  Alicia Hoover, DOB May 13, 1976, MRN [id number]  PCP: [name], MD  Electrophysiologist: Dr Rayann Heman  CC: Follow up for palpitations  Alicia Hoover is a 42 y.o. female seen today for routine electrophysiology followup.  Since last being seen in our clinic, the patient reports doing about the same. Her palpitations still occur multiple times per day but do not last for more than a second or two. She had an exercise stress test and an echo which were unrevealing.    She denies chest pain, PND, orthopnea, nausea, vomiting, dizziness, syncope, edema, weight gain, or early satiety. +dyspnea on exertion  Past Medical History:  Diagnosis Date  . Asthma   . GERD (gastroesophageal reflux disease)   . Hypersomnia with sleep apnea   . Hypertension   . Migraine   . Rhinitis    Past Surgical History:  Procedure Laterality Date  . CHOLECYSTECTOMY    . TOOTH EXTRACTION      Current Outpatient Medications  Medication Sig Dispense Refill  . benazepril-hydrochlorthiazide (LOTENSIN HCT) 20-12.5 MG tablet TAKE 1 TABLET BY MOUTH EVERY DAY 90 tablet 0  . fluticasone (FLONASE) 50 MCG/ACT nasal spray Place 2 sprays into the nose daily as needed for allergies.     Marland Kitchen ibuprofen (ADVIL,MOTRIN) 800 MG tablet TAKE 1 TABLET BY MOUTH EVERY 8 HOURS AS NEEDED 30 tablet 5  . Insulin Pen Needle (PEN NEEDLES) 31G X 6 MM MISC Use daily to inject saxenda. 30 each 2  . Liraglutide -Weight Management (SAXENDA) 18 MG/3ML SOPN Inject 3 mg into the skin daily. 5 pen 5  . Multiple Vitamin (MULTIVITAMIN) tablet Take 1 tablet by mouth daily.    . Omega-3 Fatty Acids (FISH OIL) 1360 MG CAPS Take 1,500 mg by mouth.    Marland Kitchen omeprazole (PRILOSEC) 40 MG capsule Take 40 mg by mouth daily.    . SPRINTEC 28 0.25-35 MG-MCG tablet TAKE 1 TABLET BY MOUTH EVERY DAY 84 tablet 5  . tinidazole (TINDAMAX) 500 MG tablet TAKE 2 TABLETS BY MOUTH EVERY DAY WITH BREAKFAST 10  tablet 2   No current facility-administered medications for this visit.     Allergies:   Codeine and Tramadol   Social History: Social History   Socioeconomic History  . Marital status: Widowed    Spouse name: Not on file  . Number of children: 3  . Years of education: Not on file  . Highest education level: Not on file  Occupational History  . Occupation: [employer]    Employer: [employer]  . Financial resource strain: Not on file  . Food insecurity:    Worry: Not on file    Inability: Not on file  . Transportation needs:    Medical: Not on file    Non-medical: Not on file  Tobacco Use  . Smoking status: Former Smoker    Packs/day: 0.10    Years: 1.00    Pack years: 0.10    Types: Cigarettes    Last attempt to quit: 10/09/1999    Years since quitting: 18.9  . Smokeless tobacco: Never Used  Substance and Sexual Activity  . Alcohol use: Yes    Comment: 1 glass of white wine monthly  . Drug use: No  . Sexual activity: Yes    Birth control/protection: Pill  Lifestyle  . Physical activity:    Days per week: Not on file    Minutes per  session: Not on file  . Stress: Not on file  Relationships  . Social connections:    Talks on phone: Not on file    Gets together: Not on file    Attends religious service: Not on file    Active member of club or organization: Not on file    Attends meetings of clubs or organizations: Not on file    Relationship status: Not on file  . Intimate partner violence:    Fear of current or ex partner: Not on file    Emotionally abused: Not on file    Physically abused: Not on file    Forced sexual activity: Not on file  Other Topics Concern  . Not on file  Social History Narrative  . Not on file    Family History: Family History  Problem Relation Age of Onset  . Diabetes Father   . Early death Father   . Diabetes Mother   . COPD Mother   . Rheum arthritis Mother   . Hyperlipidemia Brother   . Thyroid  disease Brother   . Alcohol abuse Neg Hx   . Cancer Neg Hx   . Depression Neg Hx   . Drug abuse Neg Hx   . Hearing loss Neg Hx   . Heart disease Neg Hx   . Hypertension Neg Hx   . Stroke Neg Hx     Review of Systems: All other systems reviewed and are otherwise negative except as noted above.   Physical Exam: VS:  BP 124/76   Pulse 94   Ht 5' 5"  (1.651 m)   Wt (!) 303 lb 9.6 oz (137.7 kg)   LMP 08/30/2018   SpO2 98%   BMI 50.52 kg/m  , BMI Body mass index is 50.52 kg/m. Wt Readings from Last 3 Encounters:  09/10/18 (!) 303 lb 9.6 oz (137.7 kg)  09/09/18 (!) 306 lb 12 oz (139.1 kg)  07/30/18 (!) 300 lb 3.2 oz (136.2 kg)    GEN- The patient is well appearing obese female, alert and oriented x 3 today.   HEENT: normocephalic, atraumatic; sclera clear, conjunctiva pink; hearing intact; oropharynx clear; neck supple, no JVP Lymph- no cervical lymphadenopathy Lungs- Clear to ausculation bilaterally, normal work of breathing.  No wheezes, rales, rhonchi Heart- Regular rate and rhythm, no murmurs, rubs or gallops, PMI not laterally displaced, occasional ectopic beat Extremities- no clubbing, cyanosis, or edema MS- no significant deformity or atrophy Skin- warm and dry, no rash or lesion  Psych- euthymic mood, full affect Neuro- strength and sensation are intact   EKG:  EKG is ordered today. The ekg ordered today shows sinus rhythm HR 94, with PACs, PR 162, QRS 80, QTc 447  Recent Labs: 03/31/2018: ALT 17; BUN 13; Creatinine, Ser 0.89; Hemoglobin 12.4; Platelets 343.0; Potassium 3.8; Sodium 138; TSH 1.77    Other studies Reviewed: Additional studies/ records that were reviewed today include: Echo 08/07/18, ETT 08/07/18 Review of the above records today demonstrates:  Echo: LVEF 60-65%, mild focal basal hypertrophy of septum, mild MR, LA normal ETT: normal BP response to exercise, no ST segment deviation, no T wave inversion.  Assessment and Plan:  1.  Palpitations/PVCs/PACs Patient continues to have palpitations. Previous monitoring shows PACs and PVCs, she has PACs on ECG today. We again discussed appropriate lifestyle modifications including regular daily exercise, weight loss, caffeine avoidence, ETOH avoidence and CPAP compliance. Will start nadolol 40m daily. If palpitations remain bothersome, patient can increase to 254mdaily.  2. Dyspnea on exertion ETT with no ST/T changes. Echo within normal limits. Likely due to deconditioning. Lifestyle modifications as noted above.  3. Obesity Body mass index is 50.52 kg/m. Likely contributing to her symptoms. Lifestyle modifications as above.  4. OSA Admits she is not compliant with CPAP. We discussed long term benefits of treating her sleep apnea. Patient agrees to try to use her CPAP again.  5. HTN Stable, no change today. If she becomes hypotensive with addition of BB, will titrate HTN therapy.   Current medicines are reviewed at length with the patient today.   The patient does not have concerns regarding her medicines.  The following changes were made today: start nadolol  Labs/ tests ordered today include:  ECG   Disposition:   Follow up with with EP PA in 3 months.   Army Fossa MD 09/10/2018 4:28 PM   [address] [place] [address] [phone] (office) [phone] (fax)

## 2018-09-19 ENCOUNTER — Encounter: Payer: Self-pay | Admitting: Family

## 2018-09-19 ENCOUNTER — Ambulatory Visit (INDEPENDENT_AMBULATORY_CARE_PROVIDER_SITE_OTHER): Payer: 59 | Admitting: Family

## 2018-09-19 VITALS — BP 128/82 | HR 94 | Temp 98.8°F | Ht 65.0 in | Wt 305.0 lb

## 2018-09-19 DIAGNOSIS — J111 Influenza due to unidentified influenza virus with other respiratory manifestations: Secondary | ICD-10-CM

## 2018-09-19 LAB — POC INFLUENZA A&B (BINAX/QUICKVUE)
Influenza A, POC: NEGATIVE
Influenza B, POC: POSITIVE — AB

## 2018-09-19 MED ORDER — PROMETHAZINE-DM 6.25-15 MG/5ML PO SYRP: 5.0000 mL | ORAL_SOLUTION | Freq: Four times a day (QID) | ORAL | 0 refills | Status: DC | PRN

## 2018-09-19 MED ORDER — OSELTAMIVIR PHOSPHATE 75 MG PO CAPS: 75.0000 mg | ORAL_CAPSULE | Freq: Two times a day (BID) | ORAL | 0 refills | Status: DC

## 2018-09-19 NOTE — Progress Notes (Signed)
Alicia Hoover is a 42 y.o. female with the following history as recorded in [employer]:  Patient Active Problem List   Diagnosis Date Noted  . Rapid palpitations 03/31/2018  . Internal derangement of left knee 04/18/2017  . Primary osteoarthritis of left knee 03/28/2017  . Visit for screening mammogram 03/27/2017  . Prediabetes 03/08/2016  . Eczema, allergic 03/08/2016  . Allergic rhinitis, cause unspecified 02/24/2014  . Routine general medical examination at a health care facility 02/24/2014  . Essential hypertension, benign 02/24/2014  . Morbid obesity ([place]) 11/01/2010  . HYPERSOMNIA WITH SLEEP APNEA UNSPECIFIED 10/16/2010    Current Outpatient Medications  Medication Sig Dispense Refill  . benazepril-hydrochlorthiazide (LOTENSIN HCT) 20-12.5 MG tablet TAKE 1 TABLET BY MOUTH EVERY DAY 90 tablet 0  . fluticasone (FLONASE) 50 MCG/ACT nasal spray Place 2 sprays into the nose daily as needed for allergies.     Marland Kitchen ibuprofen (ADVIL,MOTRIN) 800 MG tablet TAKE 1 TABLET BY MOUTH EVERY 8 HOURS AS NEEDED 30 tablet 5  . Insulin Pen Needle (PEN NEEDLES) 31G X 6 MM MISC Use daily to inject saxenda. 30 each 2  . Liraglutide -Weight Management (SAXENDA) 18 MG/3ML SOPN Inject 3 mg into the skin daily. 5 pen 5  . Multiple Vitamin (MULTIVITAMIN) tablet Take 1 tablet by mouth daily.    . nadolol (CORGARD) 20 MG tablet Take 0.5 tablets (10 mg total) by mouth daily. 45 tablet 3  . Omega-3 Fatty Acids (FISH OIL) 1360 MG CAPS Take 1,500 mg by mouth.    Marland Kitchen omeprazole (PRILOSEC) 40 MG capsule Take 40 mg by mouth daily.    . SPRINTEC 28 0.25-35 MG-MCG tablet TAKE 1 TABLET BY MOUTH EVERY DAY 84 tablet 5  . tinidazole (TINDAMAX) 500 MG tablet TAKE 2 TABLETS BY MOUTH EVERY DAY WITH BREAKFAST 10 tablet 2  . oseltamivir (TAMIFLU) 75 MG capsule Take 1 capsule (75 mg total) by mouth 2 (two) times daily. 10 capsule 0  . promethazine-dextromethorphan (PROMETHAZINE-DM) 6.25-15 MG/5ML syrup Take 5 mLs by mouth 4 (four)  times daily as needed for cough. 118 mL 0   No current facility-administered medications for this visit.     Allergies: Codeine and Tramadol  Past Medical History:  Diagnosis Date  . Asthma   . GERD (gastroesophageal reflux disease)   . Hypersomnia with sleep apnea   . Hypertension   . Migraine   . Rhinitis     Past Surgical History:  Procedure Laterality Date  . CHOLECYSTECTOMY    . TOOTH EXTRACTION      Family History  Problem Relation Age of Onset  . Diabetes Father   . Early death Father   . Diabetes Mother   . COPD Mother   . Rheum arthritis Mother   . Hyperlipidemia Brother   . Thyroid disease Brother   . Alcohol abuse Neg Hx   . Cancer Neg Hx   . Depression Neg Hx   . Drug abuse Neg Hx   . Hearing loss Neg Hx   . Heart disease Neg Hx   . Hypertension Neg Hx   . Stroke Neg Hx     Social History   Tobacco Use  . Smoking status: Former Smoker    Packs/day: 0.10    Years: 1.00    Pack years: 0.10    Types: Cigarettes    Last attempt to quit: 10/09/1999    Years since quitting: 18.9  . Smokeless tobacco: Never Used  Substance Use Topics  . Alcohol use:  Yes    Comment: 1 glass of white wine monthly    Subjective:  Patient presents with cough/ cold symptoms x 1 day; + headache; low grade fever, chills; daughter is recovering from flu; did not take flu shot this year; no chest pain, no shortness of breath; has not used any OTC medication;   LMP- 3 weeks ago;    Objective:  Vitals:   09/19/18 0907  BP: 128/82  Pulse: 94  Temp: 98.8 F (37.1 C)  TempSrc: Oral  SpO2: 98%  Weight: (!) 305 lb (138.3 kg)  Height: 5' 5"  (1.651 m)    General: Well developed, well nourished, in no acute distress  Skin : Warm and dry.  Head: Normocephalic and atraumatic  Eyes: Sclera and conjunctiva clear; pupils round and reactive to light; extraocular movements intact  Ears: External normal; canals clear; tympanic membranes normal  Oropharynx: Pink, supple. No  suspicious lesions  Neck: Supple without thyromegaly, adenopathy  Lungs: Respirations unlabored; clear to auscultation bilaterally without wheeze, rales, rhonchi  CVS exam: normal rate and regular rhythm.  Neurologic: Alert and oriented; speech intact; face symmetrical; moves all extremities well; CNII-XII intact without focal deficit   Assessment:  1. Influenza     Plan:  + flu swab; Rx for Tamiflu 75 mg bid x 5 days; Rx for Promethazine DM 1 tsp po q 4-6 hours prn; okay to use Ibuprofen; increase fluids, rest and follow-up worse, no better.   No follow-ups on file.  No orders of the defined types were placed in this encounter.   Requested Prescriptions   Signed Prescriptions Disp Refills  . oseltamivir (TAMIFLU) 75 MG capsule 10 capsule 0    Sig: Take 1 capsule (75 mg total) by mouth 2 (two) times daily.  . promethazine-dextromethorphan (PROMETHAZINE-DM) 6.25-15 MG/5ML syrup 118 mL 0    Sig: Take 5 mLs by mouth 4 (four) times daily as needed for cough.

## 2018-09-19 NOTE — Addendum Note (Signed)
Addended by: Marcina Millard on: 09/19/2018 09:46 AM   Modules accepted: Orders

## 2018-09-19 NOTE — Patient Instructions (Signed)
Influenza, Adult Influenza ("the flu") is an infection in the lungs, nose, and throat (respiratory tract). It is caused by a virus. The flu causes many common cold symptoms, as well as a high fever and body aches. It can make you feel very sick. The flu spreads easily from person to person (is contagious). Getting a flu shot (influenza vaccination) every year is the best way to prevent the flu. Follow these instructions at home:  Take over-the-counter and prescription medicines only as told by your doctor.  Use a cool mist humidifier to add moisture (humidity) to the air in your home. This can make it easier to breathe.  Rest as needed.  Drink enough fluid to keep your pee (urine) clear or pale yellow.  Cover your mouth and nose when you cough or sneeze.  Wash your hands with soap and water often, especially after you cough or sneeze. If you cannot use soap and water, use hand sanitizer.  Stay home from work or school as told by your doctor. Unless you are visiting your doctor, try to avoid leaving home until your fever has been gone for 24 hours without the use of medicine.  Keep all follow-up visits as told by your doctor. This is important. How is this prevented?  Getting a yearly (annual) flu shot is the best way to avoid getting the flu. You may get the flu shot in late summer, fall, or winter. Ask your doctor when you should get your flu shot.  Wash your hands often or use hand sanitizer often.  Avoid contact with people who are sick during cold and flu season.  Eat healthy foods.  Drink plenty of fluids.  Get enough sleep.  Exercise regularly. Contact a doctor if:  You get new symptoms.  You have: ? Chest pain. ? Watery poop (diarrhea). ? A fever.  Your cough gets worse.  You start to have more mucus.  You feel sick to your stomach (nauseous).  You throw up (vomit). Get help right away if:  You start to be short of breath or have trouble breathing.  Your  skin or nails turn a bluish color.  You have very bad pain or stiffness in your neck.  You get a sudden headache.  You get sudden pain in your face or ear.  You cannot stop throwing up. This information is not intended to replace advice given to you by your health care provider. Make sure you discuss any questions you have with your health care provider. Document Released: 07/03/2008 Document Revised: 03/01/2016 Document Reviewed: 07/19/2015 Elsevier Interactive Patient Education  2017 [employer].

## 2018-10-14 ENCOUNTER — Other Ambulatory Visit: Payer: Self-pay | Admitting: Internal Medicine

## 2018-10-14 DIAGNOSIS — I10 Essential (primary) hypertension: Secondary | ICD-10-CM

## 2018-10-22 ENCOUNTER — Other Ambulatory Visit: Payer: Self-pay | Admitting: Internal Medicine

## 2018-10-22 DIAGNOSIS — I10 Essential (primary) hypertension: Secondary | ICD-10-CM

## 2018-10-23 MED ORDER — BENAZEPRIL-HYDROCHLOROTHIAZIDE 20-12.5 MG PO TABS: 1.0000 | ORAL_TABLET | Freq: Every day | ORAL | 1 refills | Status: DC

## 2018-12-15 ENCOUNTER — Other Ambulatory Visit: Payer: Self-pay | Admitting: Obstetrics

## 2018-12-15 DIAGNOSIS — Z1231 Encounter for screening mammogram for malignant neoplasm of breast: Secondary | ICD-10-CM

## 2018-12-17 ENCOUNTER — Other Ambulatory Visit: Payer: Self-pay

## 2018-12-17 ENCOUNTER — Ambulatory Visit
Admission: RE | Admit: 2018-12-17 | Discharge: 2018-12-17 | Disposition: A | Discharge status: Home / Self Care | Payer: 59 | Source: Ambulatory Visit

## 2018-12-17 DIAGNOSIS — Z1231 Encounter for screening mammogram for malignant neoplasm of breast: Secondary | ICD-10-CM

## 2019-01-06 ENCOUNTER — Other Ambulatory Visit: Payer: Self-pay | Admitting: Obstetrics

## 2019-01-06 DIAGNOSIS — B9689 Other specified bacterial agents as the cause of diseases classified elsewhere: Secondary | ICD-10-CM

## 2019-01-06 DIAGNOSIS — N760 Acute vaginitis: Principal | ICD-10-CM

## 2019-01-12 ENCOUNTER — Other Ambulatory Visit: Payer: Self-pay | Admitting: *Deleted

## 2019-01-12 MED ORDER — NADOLOL 20 MG PO TABS: 20.0000 mg | ORAL_TABLET | Freq: Every day | ORAL | 2 refills | Status: DC

## 2019-01-12 NOTE — Progress Notes (Signed)
RX sent into pharmacy per MD order (12/4) as requested by patient who is now taking Corgard 20 mg po daily.

## 2019-01-19 ENCOUNTER — Ambulatory Visit: Payer: 59 | Admitting: Obstetrics

## 2019-04-07 ENCOUNTER — Other Ambulatory Visit: Payer: Self-pay | Admitting: Internal Medicine

## 2019-04-07 ENCOUNTER — Other Ambulatory Visit: Payer: Self-pay | Admitting: Obstetrics

## 2019-04-07 DIAGNOSIS — I10 Essential (primary) hypertension: Secondary | ICD-10-CM

## 2019-04-07 DIAGNOSIS — B9689 Other specified bacterial agents as the cause of diseases classified elsewhere: Secondary | ICD-10-CM

## 2019-04-07 DIAGNOSIS — N760 Acute vaginitis: Secondary | ICD-10-CM

## 2019-04-08 ENCOUNTER — Ambulatory Visit (INDEPENDENT_AMBULATORY_CARE_PROVIDER_SITE_OTHER): Payer: 59 | Admitting: Internal Medicine

## 2019-04-08 ENCOUNTER — Other Ambulatory Visit: Payer: Self-pay

## 2019-04-08 ENCOUNTER — Other Ambulatory Visit (INDEPENDENT_AMBULATORY_CARE_PROVIDER_SITE_OTHER): Payer: 59

## 2019-04-08 ENCOUNTER — Encounter: Payer: Self-pay | Admitting: Internal Medicine

## 2019-04-08 VITALS — BP 138/90 | HR 85 | Temp 98.2°F | Resp 16 | Ht 65.0 in | Wt 308.0 lb

## 2019-04-08 DIAGNOSIS — Z0001 Encounter for general adult medical examination with abnormal findings: Secondary | ICD-10-CM

## 2019-04-08 DIAGNOSIS — Z0000 Encounter for general adult medical examination without abnormal findings: Secondary | ICD-10-CM

## 2019-04-08 DIAGNOSIS — L239 Allergic contact dermatitis, unspecified cause: Secondary | ICD-10-CM

## 2019-04-08 DIAGNOSIS — N3281 Overactive bladder: Secondary | ICD-10-CM

## 2019-04-08 DIAGNOSIS — M1712 Unilateral primary osteoarthritis, left knee: Secondary | ICD-10-CM

## 2019-04-08 DIAGNOSIS — R7303 Prediabetes: Secondary | ICD-10-CM | POA: Diagnosis not present

## 2019-04-08 DIAGNOSIS — Z23 Encounter for immunization: Secondary | ICD-10-CM | POA: Diagnosis not present

## 2019-04-08 DIAGNOSIS — I10 Essential (primary) hypertension: Secondary | ICD-10-CM | POA: Diagnosis not present

## 2019-04-08 DIAGNOSIS — F418 Other specified anxiety disorders: Secondary | ICD-10-CM | POA: Insufficient documentation

## 2019-04-08 LAB — CBC WITH DIFFERENTIAL/PLATELET
Basophils Absolute: 0 10*3/uL (ref 0.0–0.1)
Basophils Relative: 0.4 % (ref 0.0–3.0)
Eosinophils Absolute: 0.1 10*3/uL (ref 0.0–0.7)
Eosinophils Relative: 1.6 % (ref 0.0–5.0)
HCT: 39.1 % (ref 36.0–46.0)
Hemoglobin: 12.9 g/dL (ref 12.0–15.0)
Lymphocytes Relative: 31.9 % (ref 12.0–46.0)
Lymphs Abs: 2.9 10*3/uL (ref 0.7–4.0)
MCHC: 32.9 g/dL (ref 30.0–36.0)
MCV: 79.4 fl (ref 78.0–100.0)
Monocytes Absolute: 0.6 10*3/uL (ref 0.1–1.0)
Monocytes Relative: 7 % (ref 3.0–12.0)
Neutro Abs: 5.3 10*3/uL (ref 1.4–7.7)
Neutrophils Relative %: 59.1 % (ref 43.0–77.0)
Platelets: 390 10*3/uL (ref 150.0–400.0)
RBC: 4.92 Mil/uL (ref 3.87–5.11)
RDW: 14.4 % (ref 11.5–15.5)
WBC: 9 10*3/uL (ref 4.0–10.5)

## 2019-04-08 LAB — URINALYSIS, ROUTINE W REFLEX MICROSCOPIC
Bilirubin Urine: NEGATIVE
Ketones, ur: NEGATIVE
Leukocytes,Ua: NEGATIVE
Nitrite: NEGATIVE
Specific Gravity, Urine: >=1.03 — AB (ref 1.000–1.030)
Total Protein, Urine: NEGATIVE
Urine Glucose: NEGATIVE
Urobilinogen, UA: 0.2 (ref 0.0–1.0)
pH: 5.5 (ref 5.0–8.0)

## 2019-04-08 LAB — LIPID PANEL
Cholesterol: 138 mg/dL (ref 0–200)
HDL: 40.9 mg/dL (ref >39.00)
LDL Cholesterol: 73 mg/dL (ref 0–99)
NonHDL: 96.63
Total CHOL/HDL Ratio: 3
Triglycerides: 119 mg/dL (ref 0.0–149.0)
VLDL: 23.8 mg/dL (ref 0.0–40.0)

## 2019-04-08 LAB — COMPREHENSIVE METABOLIC PANEL
ALT: 19 U/L (ref 0–35)
AST: 18 U/L (ref 0–37)
Albumin: 3.9 g/dL (ref 3.5–5.2)
Alkaline Phosphatase: 59 U/L (ref 39–117)
BUN: 14 mg/dL (ref 6–23)
CO2: 27 mEq/L (ref 19–32)
Calcium: 8.7 mg/dL (ref 8.4–10.5)
Chloride: 104 mEq/L (ref 96–112)
Creatinine, Ser: 0.88 mg/dL (ref 0.40–1.20)
GFR: 84.41 mL/min (ref >60.00)
Glucose, Bld: 79 mg/dL (ref 70–99)
Potassium: 3.7 mEq/L (ref 3.5–5.1)
Sodium: 139 mEq/L (ref 135–145)
Total Bilirubin: 0.3 mg/dL (ref 0.2–1.2)
Total Protein: 7.1 g/dL (ref 6.0–8.3)

## 2019-04-08 LAB — TSH: TSH: 0.31 u[IU]/mL — ABNORMAL LOW (ref 0.35–4.50)

## 2019-04-08 LAB — HEMOGLOBIN A1C: Hgb A1c MFr Bld: 5.9 % (ref 4.6–6.5)

## 2019-04-08 LAB — VITAMIN D 25 HYDROXY (VIT D DEFICIENCY, FRACTURES): VITD: 36.31 ng/mL (ref 30.00–100.00)

## 2019-04-08 MED ORDER — VIIBRYD STARTER PACK 10 & 20 MG PO KIT: 1.0000 | PACK | Freq: Every day | ORAL | 0 refills | Status: DC

## 2019-04-08 MED ORDER — LEVOCETIRIZINE DIHYDROCHLORIDE 5 MG PO TABS: 5.0000 mg | ORAL_TABLET | Freq: Every evening | ORAL | 1 refills | Status: DC

## 2019-04-08 MED ORDER — INDAPAMIDE 1.25 MG PO TABS: 1.2500 mg | ORAL_TABLET | Freq: Every day | ORAL | 0 refills | Status: DC

## 2019-04-08 MED ORDER — MIRABEGRON ER 25 MG PO TB24: 25.0000 mg | ORAL_TABLET | Freq: Every day | ORAL | 1 refills | Status: DC

## 2019-04-08 MED ORDER — SAXENDA 18 MG/3ML ~~LOC~~ SOPN: 3.0000 mg | PEN_INJECTOR | Freq: Every day | SUBCUTANEOUS | 5 refills | Status: DC

## 2019-04-08 MED ORDER — CLOBETASOL PROPIONATE 0.05 % EX OINT: 1.0000 "application " | TOPICAL_OINTMENT | Freq: Two times a day (BID) | CUTANEOUS | 1 refills | Status: DC

## 2019-04-08 NOTE — Patient Instructions (Signed)

## 2019-04-08 NOTE — Progress Notes (Signed)
Subjective:  Patient ID: Alicia Hoover, female    DOB: 02-27-1976  Age: 43 y.o. MRN: [id number]  CC: Annual Exam, Hypertension, Rash, and Depression   HPI [name] presents for a CPX.  She complains of a recurrence of an itchy rash on her lower legs.  She has had this before and says it recurred approximately several weeks ago.  The prior rashes have been treated as eczema and left scars consistent with vitiligo.  She she has been treating this with over-the-counter hydrocortisone claim and Zyrtec.  She has not gotten much symptom relief with this.  She tells me her blood pressure has not been well controlled on the ACE inhibitor and hydrochlorothiazide.  She has had a few headaches recently.  She tells me she continues to take nadolol.  She complains of weight gain but denies blurred vision, chest pain, shortness of breath, palpitations, edema, or fatigue.    Outpatient Medications Prior to Visit  Medication Sig Dispense Refill  . fluticasone (FLONASE) 50 MCG/ACT nasal spray Place 2 sprays into the nose daily as needed for allergies.     . Insulin Pen Needle (PEN NEEDLES) 31G X 6 MM MISC Use daily to inject saxenda. 30 each 2  . Multiple Vitamin (MULTIVITAMIN) tablet Take 1 tablet by mouth daily.    . nadolol (CORGARD) 20 MG tablet Take 1 tablet (20 mg total) by mouth daily. 90 tablet 2  . Omega-3 Fatty Acids (FISH OIL) 1360 MG CAPS Take 1,500 mg by mouth.    Marland Kitchen omeprazole (PRILOSEC) 40 MG capsule Take 40 mg by mouth daily.    . SPRINTEC 28 0.25-35 MG-MCG tablet TAKE 1 TABLET BY MOUTH EVERY DAY 84 tablet 5  . tinidazole (TINDAMAX) 500 MG tablet TAKE 2 TABLETS BY MOUTH EVERY DAY WITH BREAKFAST 10 tablet 2  . benazepril-hydrochlorthiazide (LOTENSIN HCT) 20-12.5 MG tablet Take 1 tablet by mouth daily. 90 tablet 1  . ibuprofen (ADVIL,MOTRIN) 800 MG tablet TAKE 1 TABLET BY MOUTH EVERY 8 HOURS AS NEEDED 30 tablet 5  . Liraglutide -Weight Management (SAXENDA) 18 MG/3ML SOPN Inject 3  mg into the skin daily. 5 pen 5  . oseltamivir (TAMIFLU) 75 MG capsule Take 1 capsule (75 mg total) by mouth 2 (two) times daily. 10 capsule 0  . promethazine-dextromethorphan (PROMETHAZINE-DM) 6.25-15 MG/5ML syrup Take 5 mLs by mouth 4 (four) times daily as needed for cough. 118 mL 0   No facility-administered medications prior to visit.     ROS Review of Systems  Constitutional: Positive for unexpected weight change. Negative for diaphoresis and fatigue.  HENT: Negative.   Eyes: Negative.   Respiratory: Positive for apnea. Negative for cough, shortness of breath and wheezing.   Cardiovascular: Negative for chest pain, palpitations and leg swelling.  Gastrointestinal: Negative for abdominal pain, constipation, diarrhea, nausea and vomiting.  Genitourinary: Negative.  Negative for difficulty urinating, dysuria, hematuria, menstrual problem and vaginal bleeding.       She complains of urinary frequency, urgency, and incontinence.  Musculoskeletal: Positive for arthralgias. Negative for myalgias.  Skin: Positive for rash. Negative for color change.  Neurological: Positive for headaches. Negative for dizziness, weakness, light-headedness and numbness.  Hematological: Negative for adenopathy. Does not bruise/bleed easily.  Psychiatric/Behavioral: Positive for dysphoric mood and sleep disturbance. Negative for behavioral problems, confusion and suicidal ideas. The patient is nervous/anxious.     Objective:  BP 138/90 (BP Location: Left Arm, Patient Position: Sitting, Cuff Size: Large)   Pulse 85   Temp  98.2 F (36.8 C) (Oral)   Resp 16   Ht 5' 5"  (1.651 m)   Wt (!) 308 lb (139.7 kg)   LMP 03/13/2019 (Exact Date)   SpO2 96%   BMI 51.25 kg/m   BP Readings from Last 3 Encounters:  04/08/19 138/90  09/19/18 128/82  09/10/18 124/76    Wt Readings from Last 3 Encounters:  04/08/19 (!) 308 lb (139.7 kg)  09/19/18 (!) 305 lb (138.3 kg)  09/10/18 (!) 303 lb 9.6 oz (137.7 kg)     Physical Exam Vitals signs reviewed.  Constitutional:      Appearance: She is obese. She is not ill-appearing or diaphoretic.  HENT:     Nose: Nose normal.     Mouth/Throat:     Mouth: Mucous membranes are moist.  Eyes:     General: No scleral icterus.    Conjunctiva/sclera: Conjunctivae normal.  Neck:     Musculoskeletal: Normal range of motion. No neck rigidity.  Cardiovascular:     Rate and Rhythm: Normal rate and regular rhythm.     Heart sounds: No murmur.  Pulmonary:     Effort: Pulmonary effort is normal.     Breath sounds: No stridor. No wheezing, rhonchi or rales.  Abdominal:     General: Abdomen is protuberant. Bowel sounds are normal. There is no distension.     Palpations: There is no hepatomegaly, splenomegaly or mass.     Tenderness: There is no abdominal tenderness. There is no guarding.     Hernia: No hernia is present.  Musculoskeletal: Normal range of motion.        General: No swelling.     Right lower leg: No edema.     Left lower leg: No edema.  Lymphadenopathy:     Cervical: No cervical adenopathy.  Skin:    General: Skin is warm and dry.     Findings: Rash present. Rash is papular.       Neurological:     General: No focal deficit present.     Mental Status: She is alert and oriented to person, place, and time. Mental status is at baseline.     Lab Results  Component Value Date   WBC 9.0 04/08/2019   HGB 12.9 04/08/2019   HCT 39.1 04/08/2019   PLT 390.0 04/08/2019   GLUCOSE 79 04/08/2019   CHOL 138 04/08/2019   TRIG 119.0 04/08/2019   HDL 40.90 04/08/2019   LDLDIRECT 57.0 03/27/2017   LDLCALC 73 04/08/2019   ALT 19 04/08/2019   AST 18 04/08/2019   NA 139 04/08/2019   K 3.7 04/08/2019   CL 104 04/08/2019   CREATININE 0.88 04/08/2019   BUN 14 04/08/2019   CO2 27 04/08/2019   TSH 0.31 (L) 04/08/2019   HGBA1C 5.9 04/08/2019    Mm 3d Screen Breast Bilateral  Result Date: 12/17/2018 CLINICAL DATA:  Screening. EXAM: DIGITAL SCREENING  BILATERAL MAMMOGRAM WITH TOMO AND CAD COMPARISON:  Previous exam(s). ACR Breast Density Category b: There are scattered areas of fibroglandular density. FINDINGS: There are no findings suspicious for malignancy. Images were processed with CAD. IMPRESSION: No mammographic evidence of malignancy. A result letter of this screening mammogram will be mailed directly to the patient. RECOMMENDATION: Screening mammogram in one year. (Code:SM-B-01Y) BI-RADS CATEGORY  1: Negative. Electronically Signed   By: Lovey Newcomer M.D.   On: 12/17/2018 12:39    Assessment & Plan:   Gaila was seen today for annual exam, hypertension, rash and depression.  Diagnoses and all orders for this visit:  Essential hypertension, benign- Her blood pressure is not adequately well controlled and she is symptomatic.  I do not think she is benefiting much from the ACE inhibitor so have asked her to stop taking it.  I have also asked her to upgrade to a more potent thiazide diuretic.  Her labs are negative for secondary causes or endorgan damage. -     CBC with Differential/Platelet; Future -     TSH; Future -     Urinalysis, Routine w reflex microscopic; Future -     VITAMIN D 25 Hydroxy (Vit-D Deficiency, Fractures); Future -     Comprehensive metabolic panel; Future -     indapamide (LOZOL) 1.25 MG tablet; Take 1 tablet (1.25 mg total) by mouth daily.  Primary osteoarthritis of left knee  Prediabetes-A1c is at 5.9%.  Medical therapy is not indicated. -     Hemoglobin A1c; Future -     Comprehensive metabolic panel; Future  Routine general medical examination at a health care facility- Exam completed, labs reviewed, vaccines reviewed and updated, mammogram and Pap are up-to-date, she does not have an elevated ASCVD risk score so I did not recommend a statin, patient education material was given. -     Lipid panel; Future  Need for Tdap vaccination  Morbid obesity ([place]) -     Liraglutide -Weight Management (SAXENDA) 18  MG/3ML SOPN; Inject 3 mg into the skin daily.  Eczema, allergic -     levocetirizine (XYZAL) 5 MG tablet; Take 1 tablet (5 mg total) by mouth every evening. -     clobetasol ointment (TEMOVATE) 0.05 %; Apply 1 application topically 2 (two) times daily.  OAB (overactive bladder)- Her urine is negative for abnormal elements, glucose, or infection.  I will treat her for OAB. -     mirabegron ER (MYRBETRIQ) 25 MG TB24 tablet; Take 1 tablet (25 mg total) by mouth daily. -     CULTURE, URINE COMPREHENSIVE; Future  Depression with anxiety- She has worsening symptoms of depression and anxiety.  I have asked her to start taking Viibryd. -     Vilazodone HCl (VIIBRYD STARTER PACK) 10 & 20 MG KIT; Take 1 tablet by mouth daily.  Other orders -     Tdap vaccine greater than or equal to 7yo IM   I have discontinued Lorece L. Riles's ibuprofen, oseltamivir, promethazine-dextromethorphan, and benazepril-hydrochlorthiazide. I am also having her start on levocetirizine, clobetasol ointment, mirabegron ER, indapamide, and Viibryd Starter Pack. Additionally, I am having her maintain her fluticasone, omeprazole, Fish Oil, multivitamin, Sprintec 28, Pen Needles, nadolol, tinidazole, and Saxenda.  Meds ordered this encounter  Medications  . levocetirizine (XYZAL) 5 MG tablet    Sig: Take 1 tablet (5 mg total) by mouth every evening.    Dispense:  90 tablet    Refill:  1  . clobetasol ointment (TEMOVATE) 0.05 %    Sig: Apply 1 application topically 2 (two) times daily.    Dispense:  60 g    Refill:  1  . mirabegron ER (MYRBETRIQ) 25 MG TB24 tablet    Sig: Take 1 tablet (25 mg total) by mouth daily.    Dispense:  90 tablet    Refill:  1  . indapamide (LOZOL) 1.25 MG tablet    Sig: Take 1 tablet (1.25 mg total) by mouth daily.    Dispense:  90 tablet    Refill:  0  . Liraglutide -Weight Management ([place])  18 MG/3ML SOPN    Sig: Inject 3 mg into the skin daily.    Dispense:  5 pen    Refill:  5  .  Vilazodone HCl (VIIBRYD STARTER PACK) 10 & 20 MG KIT    Sig: Take 1 tablet by mouth daily.    Dispense:  1 kit    Refill:  0     Follow-up: Return in about 3 months (around 07/09/2019).  Scarlette Calico, MD

## 2019-04-09 ENCOUNTER — Encounter: Payer: Self-pay | Admitting: Internal Medicine

## 2019-04-10 LAB — EXTRA URINE SPECIMEN

## 2019-04-10 LAB — CULTURE, URINE COMPREHENSIVE
MICRO NUMBER:: 625986
RESULT:: NO GROWTH
SPECIMEN QUALITY:: ADEQUATE

## 2019-04-19 ENCOUNTER — Other Ambulatory Visit: Payer: Self-pay | Admitting: Internal Medicine

## 2019-04-19 DIAGNOSIS — F418 Other specified anxiety disorders: Secondary | ICD-10-CM

## 2019-04-20 ENCOUNTER — Other Ambulatory Visit: Payer: Self-pay | Admitting: Internal Medicine

## 2019-04-20 DIAGNOSIS — F418 Other specified anxiety disorders: Secondary | ICD-10-CM

## 2019-04-20 MED ORDER — VIIBRYD 20 MG PO TABS: 1.0000 | ORAL_TABLET | Freq: Every day | ORAL | 1 refills | Status: DC

## 2019-04-24 ENCOUNTER — Telehealth: Payer: Self-pay | Admitting: Internal Medicine

## 2019-04-24 ENCOUNTER — Encounter: Payer: Self-pay | Admitting: Obstetrics

## 2019-04-24 ENCOUNTER — Ambulatory Visit (INDEPENDENT_AMBULATORY_CARE_PROVIDER_SITE_OTHER): Payer: 59 | Admitting: Obstetrics

## 2019-04-24 ENCOUNTER — Other Ambulatory Visit: Payer: Self-pay

## 2019-04-24 VITALS — BP 130/82 | Temp 98.5°F | Ht 65.0 in | Wt 310.8 lb

## 2019-04-24 DIAGNOSIS — N898 Other specified noninflammatory disorders of vagina: Secondary | ICD-10-CM

## 2019-04-24 DIAGNOSIS — B9689 Other specified bacterial agents as the cause of diseases classified elsewhere: Secondary | ICD-10-CM

## 2019-04-24 DIAGNOSIS — Z1151 Encounter for screening for human papillomavirus (HPV): Secondary | ICD-10-CM | POA: Diagnosis not present

## 2019-04-24 DIAGNOSIS — Z113 Encounter for screening for infections with a predominantly sexual mode of transmission: Secondary | ICD-10-CM

## 2019-04-24 DIAGNOSIS — Z124 Encounter for screening for malignant neoplasm of cervix: Secondary | ICD-10-CM

## 2019-04-24 DIAGNOSIS — Z3041 Encounter for surveillance of contraceptive pills: Secondary | ICD-10-CM

## 2019-04-24 DIAGNOSIS — Z01419 Encounter for gynecological examination (general) (routine) without abnormal findings: Secondary | ICD-10-CM | POA: Diagnosis not present

## 2019-04-24 DIAGNOSIS — N760 Acute vaginitis: Secondary | ICD-10-CM | POA: Diagnosis not present

## 2019-04-24 DIAGNOSIS — N393 Stress incontinence (female) (male): Secondary | ICD-10-CM | POA: Diagnosis not present

## 2019-04-24 LAB — POCT URINALYSIS DIPSTICK
Bilirubin, UA: NEGATIVE
Clarity, UA: NEGATIVE
Glucose, UA: NEGATIVE
Ketones, UA: NEGATIVE
Leukocytes, UA: NEGATIVE
Nitrite, UA: NEGATIVE
Protein, UA: NEGATIVE
Spec Grav, UA: 1.015 (ref 1.010–1.025)
Urobilinogen, UA: 0.2 E.U./dL
pH, UA: 7 (ref 5.0–8.0)

## 2019-04-24 LAB — HM PAP SMEAR

## 2019-04-24 NOTE — Telephone Encounter (Signed)
PA was approved - pt informed of same.

## 2019-04-24 NOTE — Progress Notes (Signed)
Pt presents for annual, pap, and vaginal STD check. Takes meds for an overactive bladder but still having urinary and stress incontinence.  Normal Mammogram 12/17/2018

## 2019-04-24 NOTE — Telephone Encounter (Signed)
Key: [id number]

## 2019-04-24 NOTE — Telephone Encounter (Signed)
Patient is calling regarding Vilazodone HCl (VIIBRYD) 20 MG TABS [[id number]]  195-  Pharmacy states waiting on PA from the insurance company.  The Patient states she will take her last pill from the starter pack on Sunday. And is asking what she should do? Please advise thank you.  CB- [phone] and the patient does use MyChart as well.

## 2019-04-24 NOTE — Progress Notes (Signed)
Subjective:        Alicia Hoover is a 43 y.o. female here for a routine exam.  Current complaints: Leaking urine.  Taking meds for OAB prescribed by PCP.  Personal health questionnaire:  Is patient [name], have a family history of breast and/or ovarian cancer: no Is there a family history of uterine cancer diagnosed at age < 79, gastrointestinal cancer, urinary tract cancer, family member who is a Field seismologist syndrome-associated carrier: no Is the patient overweight and hypertensive, family history of diabetes, personal history of gestational diabetes, preeclampsia or PCOS: yes Is patient over 37, have PCOS,  family history of premature CHD under age 15, diabetes, smoke, have hypertension or peripheral artery disease:  yes At any time, has a partner hit, kicked or otherwise hurt or frightened you?: no Over the past 2 weeks, have you felt down, depressed or hopeless?: no Over the past 2 weeks, have you felt little interest or pleasure in doing things?:no   Gynecologic History Patient's last menstrual period was 04/11/2019. Contraception: OCP (estrogen/progesterone) Last Pap: 01-07-2018. Results were: normal Last mammogram: 2020. Results were: normal  Obstetric History OB History  Gravida Para Term Preterm AB Living  3 3 3     3   SAB TAB Ectopic Multiple Live Births          3    # Outcome Date GA Lbr Len/2nd Weight Sex Delivery Anes PTL Lv  3 Term 07/01/06 [redacted]w[redacted]d  F Vag-Spont EPI  LIV  2 Term 08/31/96 [id number] F Vag-Spont EPI  LIV  1 Term 04/21/90 [id number] Vag-Spont Gen  LIV    Past Medical History:  Diagnosis Date  . Asthma   . GERD (gastroesophageal reflux disease)   . Hypersomnia with sleep apnea   . Hypertension   . Migraine   . Rhinitis     Past Surgical History:  Procedure Laterality Date  . CHOLECYSTECTOMY    . TOOTH EXTRACTION       Current Outpatient Medications:  .  clobetasol ointment (TEMOVATE) 0.09.37 Apply 1 application topically 2 (two) times  daily., Disp: 60 g, Rfl: 1 .  fluticasone (FLONASE) 50 MCG/ACT nasal spray, Place 2 sprays into the nose daily as needed for allergies. , Disp: , Rfl:  .  indapamide (LOZOL) 1.25 MG tablet, Take 1 tablet (1.25 mg total) by mouth daily., Disp: 90 tablet, Rfl: 0 .  Insulin Pen Needle (PEN NEEDLES) 31G X 6 MM MISC, Use daily to inject saxenda., Disp: 30 each, Rfl: 2 .  levocetirizine (XYZAL) 5 MG tablet, Take 1 tablet (5 mg total) by mouth every evening., Disp: 90 tablet, Rfl: 1 .  Liraglutide -Weight Management (SAXENDA) 18 MG/3ML SOPN, Inject 3 mg into the skin daily., Disp: 5 pen, Rfl: 5 .  mirabegron ER (MYRBETRIQ) 25 MG TB24 tablet, Take 1 tablet (25 mg total) by mouth daily., Disp: 90 tablet, Rfl: 1 .  Multiple Vitamin (MULTIVITAMIN) tablet, Take 1 tablet by mouth daily., Disp: , Rfl:  .  nadolol (CORGARD) 20 MG tablet, Take 1 tablet (20 mg total) by mouth daily., Disp: 90 tablet, Rfl: 2 .  omeprazole (PRILOSEC) 40 MG capsule, Take 40 mg by mouth daily., Disp: , Rfl:  .  SPRINTEC 28 0.25-35 MG-MCG tablet, TAKE 1 TABLET BY MOUTH EVERY DAY, Disp: 84 tablet, Rfl: 5 .  tinidazole (TINDAMAX) 500 MG tablet, TAKE 2 TABLETS BY MOUTH EVERY DAY WITH BREAKFAST, Disp: 10 tablet, Rfl: 2 .  Vilazodone HCl (VIIBRYD) 20 MG TABS, Take 1 tablet (20 mg total) by mouth daily., Disp: 90 tablet, Rfl: 1 .  Omega-3 Fatty Acids (FISH OIL) 1360 MG CAPS, Take 1,500 mg by mouth., Disp: , Rfl:  Allergies  Allergen Reactions  . Codeine Hives and Nausea And Vomiting  . Tramadol Diarrhea and Nausea And Vomiting    Social History   Tobacco Use  . Smoking status: Former Smoker    Packs/day: 0.10    Years: 1.00    Pack years: 0.10    Types: Cigarettes    Quit date: 10/09/1999    Years since quitting: 19.5  . Smokeless tobacco: Never Used  Substance Use Topics  . Alcohol use: Yes    Comment: 1 glass of white wine monthly    Family History  Problem Relation Age of Onset  . Diabetes Father   . Early death Father    . Diabetes Mother   . COPD Mother   . Rheum arthritis Mother   . Hyperlipidemia Brother   . Thyroid disease Brother   . Alcohol abuse Neg Hx   . Cancer Neg Hx   . Depression Neg Hx   . Drug abuse Neg Hx   . Hearing loss Neg Hx   . Heart disease Neg Hx   . Hypertension Neg Hx   . Stroke Neg Hx       Review of Systems  Constitutional: negative for fatigue and weight loss Respiratory: negative for cough and wheezing Cardiovascular: negative for chest pain, fatigue and palpitations Gastrointestinal: negative for abdominal pain and change in bowel habits Musculoskeletal:negative for myalgias Neurological: negative for gait problems and tremors Behavioral/Psych: negative for abusive relationship, depression Endocrine: negative for temperature intolerance    Genitourinary:negative for abnormal menstrual periods, genital lesions, hot flashes, sexual problems and vaginal discharge Integument/breast: negative for breast lump, breast tenderness, nipple discharge and skin lesion(s)    Objective:       BP 130/82   Temp 98.5 F (36.9 C)   Ht 5' 5"  (1.651 m)   Wt (!) 310 lb 12.8 oz (141 kg)   LMP 04/11/2019   BMI 51.72 kg/m  General:   alert  Skin:   no rash or abnormalities  Lungs:   clear to auscultation bilaterally  Heart:   regular rate and rhythm, S1, S2 normal, no murmur, click, rub or gallop  Breasts:   normal without suspicious masses, skin or nipple changes or axillary nodes  Abdomen:  normal findings: no organomegaly, soft, non-tender and no hernia  Pelvis:  External genitalia: normal general appearance Urinary system: urethral meatus normal and bladder without fullness, nontender Vaginal: normal without tenderness, induration or masses Cervix: normal appearance Adnexa: normal bimanual exam Uterus: anteverted and non-tender, normal size   Lab Review Urine pregnancy test Labs reviewed yes Radiologic studies reviewed no  50% of 25 min visit spent on counseling  and coordination of care.   Assessment:    Healthy female exam.    Plan:    Education reviewed: calcium supplements, depression evaluation, low fat, low cholesterol diet, safe sex/STD prevention, self breast exams and weight bearing exercise. Contraception: OCP (estrogen/progesterone). Follow up in: 1 year.   No orders of the defined types were placed in this encounter.  Orders Placed This Encounter  Procedures  . POCT Urinalysis Dipstick    Shelly Bombard MD 04-24-2019

## 2019-04-27 ENCOUNTER — Other Ambulatory Visit: Payer: Self-pay | Admitting: Internal Medicine

## 2019-04-27 DIAGNOSIS — N3281 Overactive bladder: Secondary | ICD-10-CM

## 2019-04-27 MED ORDER — TOLTERODINE TARTRATE ER 2 MG PO CP24: 2.0000 mg | ORAL_CAPSULE | Freq: Every day | ORAL | 1 refills | Status: DC

## 2019-04-28 LAB — CYTOLOGY - PAP
Diagnosis: NEGATIVE
HPV: NOT DETECTED

## 2019-04-29 LAB — CERVICOVAGINAL ANCILLARY ONLY
Bacterial vaginitis: POSITIVE — AB
Candida vaginitis: NEGATIVE
Chlamydia: NEGATIVE
Neisseria Gonorrhea: NEGATIVE
Trichomonas: NEGATIVE

## 2019-04-30 ENCOUNTER — Telehealth: Payer: Self-pay

## 2019-04-30 ENCOUNTER — Other Ambulatory Visit: Payer: Self-pay | Admitting: Obstetrics

## 2019-04-30 DIAGNOSIS — B9689 Other specified bacterial agents as the cause of diseases classified elsewhere: Secondary | ICD-10-CM

## 2019-04-30 DIAGNOSIS — N760 Acute vaginitis: Secondary | ICD-10-CM

## 2019-04-30 MED ORDER — TINIDAZOLE 500 MG PO TABS: ORAL_TABLET | ORAL | 2 refills | Status: DC

## 2019-04-30 NOTE — Telephone Encounter (Signed)
Attempted to contact about results and rx sent by provider, no answer, left vm.

## 2019-05-05 ENCOUNTER — Other Ambulatory Visit: Payer: Self-pay | Admitting: Obstetrics

## 2019-05-05 DIAGNOSIS — B9689 Other specified bacterial agents as the cause of diseases classified elsewhere: Secondary | ICD-10-CM

## 2019-05-05 MED ORDER — METRONIDAZOLE 500 MG PO TABS: 500.0000 mg | ORAL_TABLET | Freq: Two times a day (BID) | ORAL | 2 refills | Status: DC

## 2019-05-12 ENCOUNTER — Encounter: Payer: Self-pay | Admitting: Internal Medicine

## 2019-05-26 ENCOUNTER — Other Ambulatory Visit: Payer: Self-pay

## 2019-05-26 ENCOUNTER — Encounter: Payer: Self-pay | Admitting: Internal Medicine

## 2019-05-26 ENCOUNTER — Other Ambulatory Visit: Payer: 59

## 2019-05-26 ENCOUNTER — Ambulatory Visit (INDEPENDENT_AMBULATORY_CARE_PROVIDER_SITE_OTHER): Payer: 59 | Admitting: Internal Medicine

## 2019-05-26 VITALS — BP 140/78 | HR 68 | Temp 98.0°F | Resp 16 | Ht 65.0 in | Wt 310.0 lb

## 2019-05-26 DIAGNOSIS — R7989 Other specified abnormal findings of blood chemistry: Secondary | ICD-10-CM | POA: Diagnosis not present

## 2019-05-26 NOTE — Progress Notes (Signed)
Subjective:  Patient ID: Alicia Hoover, female    DOB: May 23, 1976  Age: 43 y.o. MRN: [id number]  CC: Thyroid Problem   HPI Alicia Hoover presents for f/up - She returns today for follow-up on a suppressed TSH level.  She had a menstrual cycle 2 weeks ago that only lasted 3 days.  She has not had irregular menstrual cycles.  She complains of fatigue and constipation.  The constipation resolves after a few doses of MiraLAX.  She takes biotin.  Outpatient Medications Prior to Visit  Medication Sig Dispense Refill  . clobetasol ointment (TEMOVATE) 2.53 % Apply 1 application topically 2 (two) times daily. 60 g 1  . fluticasone (FLONASE) 50 MCG/ACT nasal spray Place 2 sprays into the nose daily as needed for allergies.     . indapamide (LOZOL) 1.25 MG tablet Take 1 tablet (1.25 mg total) by mouth daily. 90 tablet 0  . Insulin Pen Needle (PEN NEEDLES) 31G X 6 MM MISC Use daily to inject saxenda. 30 each 2  . levocetirizine (XYZAL) 5 MG tablet Take 1 tablet (5 mg total) by mouth every evening. 90 tablet 1  . Liraglutide -Weight Management (SAXENDA) 18 MG/3ML SOPN Inject 3 mg into the skin daily. 5 pen 5  . Multiple Vitamin (MULTIVITAMIN) tablet Take 1 tablet by mouth daily.    . nadolol (CORGARD) 20 MG tablet Take 1 tablet (20 mg total) by mouth daily. 90 tablet 2  . omeprazole (PRILOSEC) 40 MG capsule Take 40 mg by mouth daily.    . SPRINTEC 28 0.25-35 MG-MCG tablet TAKE 1 TABLET BY MOUTH EVERY DAY 84 tablet 5  . tinidazole (TINDAMAX) 500 MG tablet TAKE 2 TABLETS BY MOUTH EVERY DAY WITH BREAKFAST 10 tablet 2  . tolterodine (DETROL LA) 2 MG 24 hr capsule Take 1 capsule (2 mg total) by mouth daily. 90 capsule 1  . Vilazodone HCl (VIIBRYD) 20 MG TABS Take 1 tablet (20 mg total) by mouth daily. 90 tablet 1  . metroNIDAZOLE (FLAGYL) 500 MG tablet Take 1 tablet (500 mg total) by mouth 2 (two) times daily. 14 tablet 2   No facility-administered medications prior to visit.     ROS Review of  Systems  Constitutional: Positive for fatigue. Negative for diaphoresis and unexpected weight change.  HENT: Negative.  Negative for trouble swallowing.   Eyes: Negative for visual disturbance.  Respiratory: Negative for cough, chest tightness, shortness of breath and wheezing.   Cardiovascular: Negative for chest pain, palpitations and leg swelling.  Gastrointestinal: Positive for constipation. Negative for abdominal pain, diarrhea, nausea and vomiting.  Endocrine: Negative for cold intolerance and heat intolerance.  Genitourinary: Negative.  Negative for difficulty urinating.  Musculoskeletal: Negative.  Negative for myalgias.  Skin: Negative.   Neurological: Negative.  Negative for dizziness, weakness and light-headedness.  Hematological: Negative for adenopathy. Does not bruise/bleed easily.  Psychiatric/Behavioral: Negative.  Negative for sleep disturbance and suicidal ideas. The patient is not nervous/anxious.     Objective:  BP 140/78 (BP Location: Left Arm, Patient Position: Sitting, Cuff Size: Large)   Pulse 68   Temp 98 F (36.7 C) (Oral)   Resp 16   Ht 5' 5"  (1.651 m)   Wt (!) 310 lb (140.6 kg)   LMP 05/12/2019   SpO2 98%   BMI 51.59 kg/m   BP Readings from Last 3 Encounters:  05/26/19 140/78  04/24/19 130/82  04/08/19 138/90    Wt Readings from Last 3 Encounters:  05/26/19 (!) 310 lb (  140.6 kg)  04/24/19 (!) 310 lb 12.8 oz (141 kg)  04/08/19 (!) 308 lb (139.7 kg)    Physical Exam Vitals signs reviewed.  Constitutional:      General: She is not in acute distress.    Appearance: She is obese. She is not ill-appearing, toxic-appearing or diaphoretic.  HENT:     Nose: Nose normal.     Mouth/Throat:     Mouth: Mucous membranes are moist.  Eyes:     General: No scleral icterus.    Conjunctiva/sclera: Conjunctivae normal.  Neck:     Musculoskeletal: Normal range of motion.     Thyroid: No thyroid mass, thyromegaly or thyroid tenderness.  Cardiovascular:      Rate and Rhythm: Normal rate and regular rhythm.     Pulses: Normal pulses.     Heart sounds: No murmur.  Pulmonary:     Effort: Pulmonary effort is normal.     Breath sounds: No stridor. No wheezing, rhonchi or rales.  Abdominal:     General: Abdomen is protuberant. Bowel sounds are normal. There is no distension.     Palpations: There is no hepatomegaly or splenomegaly.     Tenderness: There is no abdominal tenderness.  Musculoskeletal: Normal range of motion.     Right lower leg: No edema.     Left lower leg: No edema.  Lymphadenopathy:     Cervical: No cervical adenopathy.  Skin:    General: Skin is warm and dry.     Coloration: Skin is not pale.  Neurological:     General: No focal deficit present.     Mental Status: She is alert and oriented to person, place, and time. Mental status is at baseline.     Lab Results  Component Value Date   WBC 9.0 04/08/2019   HGB 12.9 04/08/2019   HCT 39.1 04/08/2019   PLT 390.0 04/08/2019   GLUCOSE 79 04/08/2019   CHOL 138 04/08/2019   TRIG 119.0 04/08/2019   HDL 40.90 04/08/2019   LDLDIRECT 57.0 03/27/2017   LDLCALC 73 04/08/2019   ALT 19 04/08/2019   AST 18 04/08/2019   NA 139 04/08/2019   K 3.7 04/08/2019   CL 104 04/08/2019   CREATININE 0.88 04/08/2019   BUN 14 04/08/2019   CO2 27 04/08/2019   TSH 0.03 (L) 05/26/2019   HGBA1C 5.9 04/08/2019    Mm 3d Screen Breast Bilateral  Result Date: 12/17/2018 CLINICAL DATA:  Screening. EXAM: DIGITAL SCREENING BILATERAL MAMMOGRAM WITH TOMO AND CAD COMPARISON:  Previous exam(s). ACR Breast Density Category b: There are scattered areas of fibroglandular density. FINDINGS: There are no findings suspicious for malignancy. Images were processed with CAD. IMPRESSION: No mammographic evidence of malignancy. A result letter of this screening mammogram will be mailed directly to the patient. RECOMMENDATION: Screening mammogram in one year. (Code:SM-B-01Y) BI-RADS CATEGORY  1: Negative.  Electronically Signed   By: Lovey Newcomer M.D.   On: 12/17/2018 12:39    Assessment & Plan:   Nemiah was seen today for thyroid problem.  Diagnoses and all orders for this visit:  Low TSH level- She has a suppressed TSH level with normal normal free thyroid index, nl thyroid peroxidase antibody, and normal T3 uptake.  Her T4 is mildly elevated at 14.7. This could be related to the use of biotin but with biotin the T4 should be normal.  Clinically she appears euthyroid.  I have asked her to see endocrinology to help decipher the significance of her suppressed  TSH level. -     Thyroid Panel With TSH; Future -     Thyroid peroxidase antibody; Future -     Ambulatory referral to Endocrinology   I have discontinued Alyscia L. Lio's metroNIDAZOLE. I am also having her maintain her fluticasone, omeprazole, multivitamin, Sprintec 28, Pen Needles, nadolol, levocetirizine, clobetasol ointment, indapamide, Saxenda, Viibryd, tolterodine, and tinidazole.  No orders of the defined types were placed in this encounter.    Follow-up: Return in about 3 months (around 08/26/2019).  Scarlette Calico, MD

## 2019-05-26 NOTE — Patient Instructions (Signed)

## 2019-05-27 ENCOUNTER — Encounter: Payer: Self-pay | Admitting: Internal Medicine

## 2019-05-27 LAB — THYROID PEROXIDASE ANTIBODY: Thyroperoxidase Ab SerPl-aCnc: 7 IU/mL (ref <9)

## 2019-05-27 LAB — THYROID PANEL WITH TSH
Free Thyroxine Index: 3.7 (ref 1.4–3.8)
T3 Uptake: 25 % (ref 22–35)
T4, Total: 14.7 ug/dL — ABNORMAL HIGH (ref 5.1–11.9)
TSH: 0.03 mIU/L — ABNORMAL LOW

## 2019-06-03 ENCOUNTER — Other Ambulatory Visit: Payer: Self-pay | Admitting: Obstetrics

## 2019-06-03 DIAGNOSIS — Z3041 Encounter for surveillance of contraceptive pills: Secondary | ICD-10-CM

## 2019-06-29 ENCOUNTER — Encounter: Payer: Self-pay | Admitting: Internal Medicine

## 2019-06-29 ENCOUNTER — Other Ambulatory Visit: Payer: Self-pay | Admitting: Internal Medicine

## 2019-06-29 DIAGNOSIS — I10 Essential (primary) hypertension: Secondary | ICD-10-CM

## 2019-07-10 ENCOUNTER — Encounter: Payer: Self-pay | Admitting: Internal Medicine

## 2019-07-10 ENCOUNTER — Ambulatory Visit (INDEPENDENT_AMBULATORY_CARE_PROVIDER_SITE_OTHER): Payer: 59 | Admitting: Internal Medicine

## 2019-07-10 ENCOUNTER — Other Ambulatory Visit: Payer: Self-pay

## 2019-07-10 VITALS — BP 128/74 | HR 88 | Temp 98.1°F | Ht 65.0 in | Wt 305.2 lb

## 2019-07-10 DIAGNOSIS — R7989 Other specified abnormal findings of blood chemistry: Secondary | ICD-10-CM

## 2019-07-10 LAB — T4, FREE: Free T4: 2.68 ng/dL — ABNORMAL HIGH (ref 0.60–1.60)

## 2019-07-10 LAB — TSH: TSH: <0.01 u[IU]/mL — ABNORMAL LOW (ref 0.35–4.50)

## 2019-07-10 MED ORDER — METHIMAZOLE 10 MG PO TABS: 10.0000 mg | ORAL_TABLET | Freq: Two times a day (BID) | ORAL | 3 refills | Status: DC

## 2019-07-10 NOTE — Patient Instructions (Signed)
-   Please stop by the lab today

## 2019-07-10 NOTE — Progress Notes (Signed)
Name: Alicia Hoover  MRN/ DOB: [id number], [birth year]    Age/ Sex: 43 y.o., female    PCP: Alicia Lima, MD   Reason for Endocrinology Evaluation: Low TSH      Date of Initial Endocrinology Evaluation: 07/10/2019     HPI: [name] is a 43 y.o. female with a past medical history of HTN, Obesity and depression. The patient presented for initial endocrinology clinic visit on 07/10/2019 for consultative assistance with her low TSH .   Pt noted with low TSH at 031 uIu/mL during routine work up in 04/2019. Repeat TFT's in 05/2019 confirmed .   She denies weight loss, has chronic heat intolerance. She endorses palpitations since 09/2018.    No Amiodarone  Was on Biotin until 05/2019  No radiation exposure.  Had a viral infection sometimes in June.    Brother with thyroid disease    HISTORY:  Past Medical History:  Past Medical History:  Diagnosis Date  . Asthma   . GERD (gastroesophageal reflux disease)   . Hypersomnia with sleep apnea   . Hypertension   . Migraine   . Rhinitis    Past Surgical History:  Past Surgical History:  Procedure Laterality Date  . CHOLECYSTECTOMY    . TOOTH EXTRACTION        Social History:  reports that she quit smoking about 19 years ago. Her smoking use included cigarettes. She has a 0.10 pack-year smoking history. She has never used smokeless tobacco. She reports current alcohol use. She reports that she does not use drugs.  Family History: family history includes COPD in her mother; Diabetes in her father and mother; Early death in her father; Hyperlipidemia in her brother; Rheum arthritis in her mother; Thyroid disease in her brother.   HOME MEDICATIONS: Allergies as of 07/10/2019      Reactions   Codeine Hives, Nausea And Vomiting   Tramadol Diarrhea, Nausea And Vomiting      Medication List       Accurate as of July 10, 2019  9:14 AM. If you have any questions, ask your nurse or doctor.        clobetasol  ointment 0.05 % Commonly known as: TEMOVATE Apply 1 application topically 2 (two) times daily.   fluticasone 50 MCG/ACT nasal spray Commonly known as: FLONASE Place 2 sprays into the nose daily as needed for allergies.   indapamide 1.25 MG tablet Commonly known as: LOZOL TAKE 1 TABLET BY MOUTH DAILY.   levocetirizine 5 MG tablet Commonly known as: Xyzal Take 1 tablet (5 mg total) by mouth every evening.   multivitamin tablet Take 1 tablet by mouth daily.   nadolol 20 MG tablet Commonly known as: Corgard Take 1 tablet (20 mg total) by mouth daily.   omeprazole 40 MG capsule Commonly known as: PRILOSEC Take 40 mg by mouth daily.   Pen Needles 31G X 6 MM Misc Use daily to inject saxenda.   Saxenda 18 MG/3ML Sopn Generic drug: Liraglutide -Weight Management Inject 3 mg into the skin daily.   Sprintec 28 0.25-35 MG-MCG tablet Generic drug: norgestimate-ethinyl estradiol TAKE 1 TABLET BY MOUTH EVERY DAY   tinidazole 500 MG tablet Commonly known as: TINDAMAX TAKE 2 TABLETS BY MOUTH EVERY DAY WITH BREAKFAST   tolterodine 2 MG 24 hr capsule Commonly known as: DETROL LA Take 1 capsule (2 mg total) by mouth daily.   Viibryd 20 MG Tabs Generic drug: Vilazodone HCl Take 1 tablet (20 mg  total) by mouth daily.         REVIEW OF SYSTEMS: A comprehensive ROS was conducted with the patient and is negative except as per HPI and below:  Review of Systems  Constitutional: Negative for chills and fever.  HENT: Negative for congestion and sore throat.   Respiratory: Negative for cough and shortness of breath.   Cardiovascular: Positive for palpitations. Negative for chest pain.  Gastrointestinal: Positive for diarrhea. Negative for nausea.  Neurological: Negative for tingling and tremors.  Psychiatric/Behavioral: Negative for depression. The patient is not nervous/anxious.        OBJECTIVE:  VS: BP 128/74 (BP Location: Left Arm, Patient Position: Sitting, Cuff Size:  Large)   Pulse 88   Temp 98.1 F (36.7 C)   Ht 5' 5"  (1.651 m)   Wt (!) 305 lb 3.2 oz (138.4 kg)   SpO2 94%   BMI 50.79 kg/m    Wt Readings from Last 3 Encounters:  07/10/19 (!) 305 lb 3.2 oz (138.4 kg)  05/26/19 (!) 310 lb (140.6 kg)  04/24/19 (!) 310 lb 12.8 oz (141 kg)     EXAM: General: Pt appears well and is in NAD  Hydration: Well-hydrated with moist mucous membranes and good skin turgor  Eyes: External eye exam normal without stare, lid lag or exophthalmos.  EOM intact.    Ears, Nose, Throat: Hearing: Grossly intact bilaterally Dental: Good dentition  Throat: Clear without mass, erythema or exudate  Neck: General: Supple without adenopathy. Thyroid: Thyroid size normal.  No goiter or nodules appreciated. No thyroid bruit.  Lungs: Clear with good BS bilat with no rales, rhonchi, or wheezes  Heart: Auscultation: RRR.  Abdomen: Normoactive bowel sounds, soft, nontender, without masses or organomegaly palpable  Extremities:  BL LE: No pretibial edema normal ROM and strength.  Skin: Hair: Texture and amount normal with gender appropriate distribution Skin Inspection: No rashes Skin Palpation: Skin temperature, texture, and thickness normal to palpation  Neuro: Cranial nerves: II - XII grossly intact  Motor: Normal strength throughout DTRs: 2+ and symmetric in UE without delay in relaxation phase  Mental Status: Judgment, insight: Intact Orientation: Oriented to time, place, and person Mood and affect: No depression, anxiety, or agitation     DATA REVIEWED:     ASSESSMENT/PLAN/RECOMMENDATIONS:   1.  Hyperthyroidism :   - The causes of hyperthyroidism can be persistent or transient. Common causes of subclinical hyperthyroidism include autonomously functioning thyroid adenomas and multinodular goiters or Graves' disease vs thyroiditis.   - Clinically she is euthyroid  - We discussed that Graves' Disease is a result of an autoimmune condition involving the thyroid.     We discussed with pt the benefits of methimazole in the Tx of hyperthyroidism, as well as the possible side effects/complications of anti-thyroid drug Tx (specifically detailing the rare, but serious side effect of agranulocytosis). She was informed of need for regular thyroid function monitoring while on methimazole to ensure appropriate dosage without over-treatment. As well, we discussed the possible side effects of methimazole including the chance of rash, the small chance of liver irritation/juandice and the <=1 in 300-400 chance of sudden onset agranulocytosis.  We discussed importance of going to ED promptly (and stopping methimazole) if shewere to develop significant fever with severe sore throat of other evidence of acute infection.     We extensively discussed the various treatment options for hyperthyroidism and Graves disease including ablation therapy with radioactive iodine versus antithyroid drug treatment versus surgical therapy.  We recommended  to the patient that we felt, at this time, that thionamide therapy would be most optimal.  We discussed the various possible benefits versus side effects of the various therapies.   - TRAb pending   Medications : Methimazole 10 mg BID    Labs in 6 weeks   Labs discussed with the pt on 07/10/19 @ 1615   F/U in 3 months  Signed electronically by: Mack Guise, MD  [facility] Endocrinology  [address] [place], [place] [address] Phone: [phone] FAX: [phone]   CC: Alicia Lima, MD [address] [place] [address] Phone: [phone] Fax: [phone]   Return to Endocrinology clinic as below: No future appointments.

## 2019-07-13 LAB — TRAB (TSH RECEPTOR BINDING ANTIBODY): TRAB: 14.6 IU/L — ABNORMAL HIGH (ref <=2.00)

## 2019-07-21 ENCOUNTER — Other Ambulatory Visit: Payer: Self-pay | Admitting: Internal Medicine

## 2019-07-21 DIAGNOSIS — I10 Essential (primary) hypertension: Secondary | ICD-10-CM

## 2019-07-21 MED ORDER — INDAPAMIDE 1.25 MG PO TABS: 1.2500 mg | ORAL_TABLET | Freq: Every day | ORAL | 0 refills | Status: DC

## 2019-07-27 ENCOUNTER — Ambulatory Visit: Payer: 59 | Admitting: Obstetrics

## 2019-07-30 ENCOUNTER — Encounter: Payer: Self-pay | Admitting: Obstetrics

## 2019-07-30 ENCOUNTER — Ambulatory Visit (INDEPENDENT_AMBULATORY_CARE_PROVIDER_SITE_OTHER): Payer: 59 | Admitting: Obstetrics

## 2019-07-30 ENCOUNTER — Other Ambulatory Visit: Payer: Self-pay

## 2019-07-30 VITALS — BP 142/88 | HR 83 | Wt 306.0 lb

## 2019-07-30 DIAGNOSIS — N760 Acute vaginitis: Secondary | ICD-10-CM | POA: Diagnosis not present

## 2019-07-30 DIAGNOSIS — N898 Other specified noninflammatory disorders of vagina: Secondary | ICD-10-CM

## 2019-07-30 DIAGNOSIS — B9689 Other specified bacterial agents as the cause of diseases classified elsewhere: Secondary | ICD-10-CM | POA: Diagnosis not present

## 2019-07-30 DIAGNOSIS — B373 Candidiasis of vulva and vagina: Secondary | ICD-10-CM | POA: Diagnosis not present

## 2019-07-30 DIAGNOSIS — Z124 Encounter for screening for malignant neoplasm of cervix: Secondary | ICD-10-CM

## 2019-07-30 MED ORDER — TERCONAZOLE 0.4 % VA CREA: 1.0000 | TOPICAL_CREAM | Freq: Every day | VAGINAL | 0 refills | Status: DC

## 2019-07-30 NOTE — Progress Notes (Signed)
Patient ID: Alicia Hoover, female   DOB: [birth year], 43 y.o.   MRN: [id number]  Chief Complaint  Patient presents with  . Gynecologic Exam    HPI Alicia Hoover is a 43 y.o. female.  Complains of vaginal itching, mostly on the outside.  Tried Monostat 1 without relief.  Denies vaginal discharge.  HPI  Past Medical History:  Diagnosis Date  . Asthma   . GERD (gastroesophageal reflux disease)   . Hypersomnia with sleep apnea   . Hypertension   . Migraine   . Rhinitis     Past Surgical History:  Procedure Laterality Date  . CHOLECYSTECTOMY    . TOOTH EXTRACTION      Family History  Problem Relation Age of Onset  . Diabetes Father   . Early death Father   . Diabetes Mother   . COPD Mother   . Rheum arthritis Mother   . Hyperlipidemia Brother   . Thyroid disease Brother   . Alcohol abuse Neg Hx   . Cancer Neg Hx   . Depression Neg Hx   . Drug abuse Neg Hx   . Hearing loss Neg Hx   . Heart disease Neg Hx   . Hypertension Neg Hx   . Stroke Neg Hx     Social History Social History   Tobacco Use  . Smoking status: Former Smoker    Packs/day: 0.10    Years: 1.00    Pack years: 0.10    Types: Cigarettes    Quit date: 10/09/1999    Years since quitting: 19.8  . Smokeless tobacco: Never Used  Substance Use Topics  . Alcohol use: Yes    Comment: 1 glass of white wine monthly  . Drug use: No    Allergies  Allergen Reactions  . Codeine Hives and Nausea And Vomiting  . Tramadol Diarrhea and Nausea And Vomiting    Current Outpatient Medications  Medication Sig Dispense Refill  . clobetasol ointment (TEMOVATE) 4.33 % Apply 1 application topically 2 (two) times daily. 60 g 1  . fluticasone (FLONASE) 50 MCG/ACT nasal spray Place 2 sprays into the nose daily as needed for allergies.     . indapamide (LOZOL) 1.25 MG tablet Take 1 tablet (1.25 mg total) by mouth daily. 90 tablet 0  . Insulin Pen Needle (PEN NEEDLES) 31G X 6 MM MISC Use daily to inject saxenda. 30  each 2  . levocetirizine (XYZAL) 5 MG tablet Take 1 tablet (5 mg total) by mouth every evening. 90 tablet 1  . Liraglutide -Weight Management (SAXENDA) 18 MG/3ML SOPN Inject 3 mg into the skin daily. 5 pen 5  . methimazole (TAPAZOLE) 10 MG tablet Take 1 tablet (10 mg total) by mouth 2 (two) times daily. 60 tablet 3  . Multiple Vitamin (MULTIVITAMIN) tablet Take 1 tablet by mouth daily.    . nadolol (CORGARD) 20 MG tablet Take 1 tablet (20 mg total) by mouth daily. 90 tablet 2  . omeprazole (PRILOSEC) 40 MG capsule Take 40 mg by mouth daily.    . SPRINTEC 28 0.25-35 MG-MCG tablet TAKE 1 TABLET BY MOUTH EVERY DAY 84 tablet 5  . tinidazole (TINDAMAX) 500 MG tablet TAKE 2 TABLETS BY MOUTH EVERY DAY WITH BREAKFAST 10 tablet 2  . tolterodine (DETROL LA) 2 MG 24 hr capsule Take 1 capsule (2 mg total) by mouth daily. 90 capsule 1  . Vilazodone HCl (VIIBRYD) 20 MG TABS Take 1 tablet (20 mg total) by mouth daily. 90 tablet  1  . terconazole (TERAZOL 7) 0.4 % vaginal cream Place 1 applicator vaginally at bedtime. 45 g 0   No current facility-administered medications for this visit.     Review of Systems Review of Systems Constitutional: negative for fatigue and weight loss Respiratory: negative for cough and wheezing Cardiovascular: negative for chest pain, fatigue and palpitations Gastrointestinal: negative for abdominal pain and change in bowel habits Genitourinary:negative Integument/breast: negative for nipple discharge Musculoskeletal:negative for myalgias Neurological: negative for gait problems and tremors Behavioral/Psych: negative for abusive relationship, depression Endocrine: negative for temperature intolerance      Blood pressure (!) 142/88, pulse 83, weight (!) 306 lb (138.8 kg).  Physical Exam Physical Exam General:   alert  Skin:   no rash or abnormalities  Lungs:   clear to auscultation bilaterally  Heart:   regular rate and rhythm, S1, S2 normal, no murmur, click, rub or  gallop  Breasts:   normal without suspicious masses, skin or nipple changes or axillary nodes  Abdomen:  normal findings: no organomegaly, soft, non-tender and no hernia  Pelvis:  External genitalia: normal general appearance Urinary system: urethral meatus normal and bladder without fullness, nontender Vaginal: normal without tenderness, induration or masses Cervix: normal appearance Adnexa: normal bimanual exam Uterus: anteverted and non-tender, normal size    50% of 15 min visit spent on counseling and coordination of care.   Data Reviewed Labs:  Positive for BV in July 2020  Assessment     1. Vaginal itching Rx: - terconazole (TERAZOL 7) 0.4 % vaginal cream; Place 1 applicator vaginally at bedtime.  Dispense: 45 g; Refill: 0 - possible inflammatory dermatitis I.e. Lichen Sclerosis - will treat for Lichen if cultures are negative  2. Vaginal discharge Rx: - Cervicovaginal ancillary only( [place])    Plan    Follow up prn  No orders of the defined types were placed in this encounter.  Meds ordered this encounter  Medications  . terconazole (TERAZOL 7) 0.4 % vaginal cream    Sig: Place 1 applicator vaginally at bedtime.    Dispense:  45 g    Refill:  0    Shelly Bombard, MD 07/30/2019 10:39 AM

## 2019-08-06 ENCOUNTER — Other Ambulatory Visit: Payer: Self-pay | Admitting: Obstetrics

## 2019-08-06 DIAGNOSIS — N760 Acute vaginitis: Secondary | ICD-10-CM

## 2019-08-06 DIAGNOSIS — B9689 Other specified bacterial agents as the cause of diseases classified elsewhere: Secondary | ICD-10-CM

## 2019-08-06 LAB — CERVICOVAGINAL ANCILLARY ONLY
Bacterial Vaginitis (gardnerella): POSITIVE — AB
Candida Glabrata: NEGATIVE
Candida Vaginitis: POSITIVE — AB
Chlamydia: NEGATIVE
Comment: NEGATIVE
Comment: NEGATIVE
Comment: NEGATIVE
Comment: NEGATIVE
Comment: NEGATIVE
Comment: NORMAL
Neisseria Gonorrhea: NEGATIVE
Trichomonas: NEGATIVE

## 2019-08-06 MED ORDER — TINIDAZOLE 500 MG PO TABS: ORAL_TABLET | ORAL | 2 refills | Status: DC

## 2019-09-07 ENCOUNTER — Encounter: Payer: Self-pay | Admitting: Internal Medicine

## 2019-09-11 ENCOUNTER — Other Ambulatory Visit: Payer: Self-pay | Admitting: Obstetrics

## 2019-09-22 ENCOUNTER — Other Ambulatory Visit: Payer: Self-pay | Admitting: Internal Medicine

## 2019-09-22 DIAGNOSIS — L239 Allergic contact dermatitis, unspecified cause: Secondary | ICD-10-CM

## 2019-10-12 ENCOUNTER — Encounter: Payer: Self-pay | Admitting: Internal Medicine

## 2019-10-12 ENCOUNTER — Other Ambulatory Visit: Payer: Self-pay

## 2019-10-12 ENCOUNTER — Ambulatory Visit (INDEPENDENT_AMBULATORY_CARE_PROVIDER_SITE_OTHER): Payer: BC Managed Care – PPO | Admitting: Internal Medicine

## 2019-10-12 VITALS — BP 124/86 | HR 73 | Temp 98.7°F | Ht 65.0 in | Wt 305.4 lb

## 2019-10-12 DIAGNOSIS — E0590 Thyrotoxicosis, unspecified without thyrotoxic crisis or storm: Secondary | ICD-10-CM | POA: Diagnosis not present

## 2019-10-12 DIAGNOSIS — E0500 Thyrotoxicosis with diffuse goiter without thyrotoxic crisis or storm: Secondary | ICD-10-CM | POA: Insufficient documentation

## 2019-10-12 LAB — T4, FREE: Free T4: 1.02 ng/dL (ref 0.60–1.60)

## 2019-10-12 LAB — TSH: TSH: <0.01 u[IU]/mL — ABNORMAL LOW (ref 0.35–4.50)

## 2019-10-12 NOTE — Progress Notes (Signed)
Name: Alicia Hoover  MRN/ DOB: [id number], 1976-10-05    Age/ Sex: 44 y.o., female     PCP: [name], MD   Reason for Endocrinology Evaluation: Hyperthyroidism     Initial Endocrinology Clinic Visit: 07/10/2019    PATIENT IDENTIFIER: Alicia Hoover is a 44 y.o.,, female with a past medical history of HTN, Obesity and Depression. She has followed with [facility] since 07/10/2019 for consultative assistance with management of her hyperthyroidism  HISTORICAL SUMMARY: The patient was first diagnosed with subclinical hyperthyroidism in 04/2019 with a TSh of    No Amiodarone  Was on Biotin until 05/2019  No radiation exposure.    Brother with thyroid disease   SUBJECTIVE:   During last visit (07/10/2019): Started methimazole.   Today (10/12/2019):  Alicia Hoover is here for a 3 month follow up on hyperthyroidism secondary to Graves' disease.    She denies weight gain but endorses palpitations. She continues with heat intolerance. No nausea or vomiting Denies diarrhea. Denies local neck symptoms      ROS:  As per HPI.   HISTORY:  Past Medical History:  Past Medical History:  Diagnosis Date  . Asthma   . GERD (gastroesophageal reflux disease)   . Hypersomnia with sleep apnea   . Hypertension   . Migraine   . Rhinitis    Past Surgical History:  Past Surgical History:  Procedure Laterality Date  . CHOLECYSTECTOMY    . TOOTH EXTRACTION      Social History:  reports that she quit smoking about 20 years ago. Her smoking use included cigarettes. She has a 0.10 pack-year smoking history. She has never used smokeless tobacco. She reports current alcohol use. She reports that she does not use drugs. Family History:  Family History  Problem Relation Age of Onset  . Diabetes Father   . Early death Father   . Diabetes Mother   . COPD Mother   . Rheum arthritis Mother   . Hyperlipidemia Brother   . Thyroid disease Brother   . Alcohol abuse  Neg Hx   . Cancer Neg Hx   . Depression Neg Hx   . Drug abuse Neg Hx   . Hearing loss Neg Hx   . Heart disease Neg Hx   . Hypertension Neg Hx   . Stroke Neg Hx      HOME MEDICATIONS: Allergies as of 10/12/2019      Reactions   Codeine Hives, Nausea And Vomiting   Tramadol Diarrhea, Nausea And Vomiting      Medication List       Accurate as of October 12, 2019  9:10 AM. If you have any questions, ask your nurse or doctor.        clobetasol ointment 0.05 % Commonly known as: TEMOVATE Apply 1 application topically 2 (two) times daily.   fluticasone 50 MCG/ACT nasal spray Commonly known as: FLONASE Place 2 sprays into the nose daily as needed for allergies.   ibuprofen 800 MG tablet Commonly known as: ADVIL TAKE 1 TABLET BY MOUTH EVERY 8 HOURS AS NEEDED   indapamide 1.25 MG tablet Commonly known as: LOZOL Take 1 tablet (1.25 mg total) by mouth daily.   levocetirizine 5 MG tablet Commonly known as: XYZAL TAKE 1 TABLET BY MOUTH EVERY DAY IN THE EVENING   methimazole 10 MG tablet Commonly known as: TAPAZOLE Take 1 tablet (10 mg total) by mouth 2 (two) times daily.   multivitamin tablet Take 1  tablet by mouth daily.   nadolol 20 MG tablet Commonly known as: CORGARD TAKE 1 TABLET BY MOUTH EVERY DAY   omeprazole 40 MG capsule Commonly known as: PRILOSEC Take 40 mg by mouth daily.   Pen Needles 31G X 6 MM Misc Use daily to inject saxenda.   Saxenda 18 MG/3ML Sopn Generic drug: Liraglutide -Weight Management Inject 3 mg into the skin daily.   Sprintec 28 0.25-35 MG-MCG tablet Generic drug: norgestimate-ethinyl estradiol TAKE 1 TABLET BY MOUTH EVERY DAY   terconazole 0.4 % vaginal cream Commonly known as: TERAZOL 7 Place 1 applicator vaginally at bedtime.   tinidazole 500 MG tablet Commonly known as: TINDAMAX TAKE 2 TABLETS BY MOUTH EVERY DAY WITH BREAKFAST   tolterodine 2 MG 24 hr capsule Commonly known as: DETROL LA Take 1 capsule (2 mg total) by  mouth daily.   Viibryd 20 MG Tabs Generic drug: Vilazodone HCl Take 1 tablet (20 mg total) by mouth daily.         OBJECTIVE:   PHYSICAL EXAM: VS: BP 124/86 (BP Location: Left Arm, Patient Position: Sitting, Cuff Size: Large)   Pulse 73   Temp 98.7 F (37.1 C)   Ht 5' 5"  (1.651 m)   Wt (!) 305 lb 6.4 oz (138.5 kg)   SpO2 98%   BMI 50.82 kg/m    EXAM: General: Pt appears well and is in NAD  Eyes: External eye exam normal without stare, lid lag or exophthalmos.  EOM intact.    Neck: General: Supple without adenopathy. Thyroid: Thyroid size normal.  No goiter or nodules appreciated. No thyroid bruit.  Lungs: Clear with good BS bilat with no rales, rhonchi, or wheezes  Heart: Auscultation: RRR.  Abdomen: Normoactive bowel sounds, soft, nontender, without masses or organomegaly palpable  Extremities: BL LE: No pretibial edema normal ROM and strength.  Neuro: Cranial nerves: II - XII grossly intact  Motor: Normal strength throughout DTRs: 2+ and symmetric in UE without delay in relaxation phase  Mental Status: Judgment, insight: Intact Orientation: Oriented to time, place, and person Mood and affect: No depression, anxiety, or agitation     DATA REVIEWED:  Results for WINRY, EGNEW (MRN [id number]) as of 10/12/2019 15:41  Ref. Range 07/10/2019 09:23 10/12/2019 09:27  TSH Latest Ref Range: 0.35 - 4.50 uIU/mL <0.01 (L) <0.01 (L)  T4,Free(Direct) Latest Ref Range: 0.60 - 1.60 ng/dL 2.68 (H) 1.02     ASSESSMENT / PLAN / RECOMMENDATIONS:   1. Hyperthyroidism Secondary to Graves' Disease:   -  Clinically she is euthyroid  - No local neck symptoms - FT4 is normal , TSH still low, will continue current dose of methimazole  - Tolerating methimazole without side effects.    Medications   Methimazole 10 mg , 2 tabs daily     2. Graves' Disease:    - No extrathyroidal manifestations of Grave's disease. Pt urged to notify her ophthalmologist about this diagnosis.    F/U in 3 months Labs in 6 weeks     Signed electronically by: Mack Guise, MD  [facility] Endocrin[facility]  [address] [place], [place] [address] Phone: [phone] FAX: [phone]      CC: [name], MD [place] [address] Phone: [phone]  Fax: [phone]   Return to Endocrinology clinic as below: No future appointments.

## 2019-10-12 NOTE — Patient Instructions (Signed)
We recommend that you follow these hyperthyroidism instructions at home:  1) Take Methimazole 10 mg TWO tablets a day- until your results are back   If you develop severe sore throat with high fevers OR develop unexplained yellowing of your skin, eyes, under your tongue, severe abdominal pain with nausea or vomiting --> then please get evaluated immediately.   3) Get repeat thyroid labs 6 weeks .   It is ESSENTIAL to get follow-up labs to help avoid over or undertreatment of your hyperthyroidism - both of which can be dangerous to your health.

## 2019-10-13 ENCOUNTER — Encounter: Payer: Self-pay | Admitting: Internal Medicine

## 2019-10-13 LAB — SARS CORONAVIRUS 2: SARS CoV2 AB IGG: POSITIVE

## 2019-10-13 NOTE — Telephone Encounter (Signed)
Spoke to pt and she stated that felt worse last Friday. So far now, she has a lingering cough and some congestion. Informed that there are options for pt and pt agreed to be scheduled with the resp clinic at [facility].

## 2019-10-14 ENCOUNTER — Encounter: Payer: Self-pay | Admitting: Internal Medicine

## 2019-10-14 ENCOUNTER — Ambulatory Visit (INDEPENDENT_AMBULATORY_CARE_PROVIDER_SITE_OTHER): Payer: BC Managed Care – PPO | Admitting: Family Medicine

## 2019-10-14 ENCOUNTER — Other Ambulatory Visit: Payer: Self-pay | Admitting: Internal Medicine

## 2019-10-14 VITALS — BP 120/90 | HR 91 | Temp 98.7°F | Ht 65.0 in | Wt 306.0 lb

## 2019-10-14 DIAGNOSIS — J40 Bronchitis, not specified as acute or chronic: Secondary | ICD-10-CM

## 2019-10-14 DIAGNOSIS — U071 COVID-19: Secondary | ICD-10-CM | POA: Diagnosis not present

## 2019-10-14 DIAGNOSIS — E0500 Thyrotoxicosis with diffuse goiter without thyrotoxic crisis or storm: Secondary | ICD-10-CM

## 2019-10-14 DIAGNOSIS — R0789 Other chest pain: Secondary | ICD-10-CM | POA: Diagnosis not present

## 2019-10-14 MED ORDER — AMOXICILLIN-POT CLAVULANATE 875-125 MG PO TABS: 1.0000 | ORAL_TABLET | Freq: Two times a day (BID) | ORAL | 0 refills | Status: DC

## 2019-10-14 MED ORDER — ALBUTEROL SULFATE HFA 108 (90 BASE) MCG/ACT IN AERS: 2.0000 | INHALATION_SPRAY | RESPIRATORY_TRACT | 0 refills | Status: DC | PRN

## 2019-10-14 MED ORDER — BENZONATATE 100 MG PO CAPS: 100.0000 mg | ORAL_CAPSULE | Freq: Three times a day (TID) | ORAL | 0 refills | Status: DC | PRN

## 2019-10-14 NOTE — Patient Instructions (Addendum)
  If symptoms worsen of do not improve, notify your PCP or Mychart companion to request a follow-up appointment here at our clinic.         This information is directly available on the CDC website: RunningShows.co.za.html    Source:CDC Reference to specific commercial products, manufacturers, companies, or trademarks does not constitute its endorsement or recommendation by the [place], [place], or Centers for [employer] and [employer].

## 2019-10-14 NOTE — Progress Notes (Signed)
Patient ID: Alicia Hoover, female    DOB: 1976/06/25, 44 y.o.   MRN: [id number]  PCP: [name], MD  No chief complaint on file.   Subjective:  HPI  Alicia Hoover is a 44 y.o. female presents to [facility] respiratory clinic for evaluation of symptoms related to recent diagnosis of COVID-19. Diagnosed with COVID-19 10/10/19. Today she complains of pressure in bilateral eyes and  tenderness of upper and lower eye lids, fatigue, and headache has remained persistent. She is concerned about productive cough which is now productive yellow phlegm. She endorses wheezing.Chest tightness with cough only. Afebrile. She feels symptoms are lingering but not worsened since the onset of symptoms. She is treating symptoms at home with Mucinex Cough and Flu with minimal relief of symptoms. Patient is high risk for complications related to COVID-19: morbid obesity and hypertension  Social History   Socioeconomic History  . Marital status: Widowed    Spouse name: Not on file  . Number of children: 3  . Years of education: Not on file  . Highest education level: Not on file  Occupational History  . Occupation: [employer]    Employer: [employer]  Tobacco Use  . Smoking status: Former Smoker    Packs/day: 0.10    Years: 1.00    Pack years: 0.10    Types: Cigarettes    Quit date: 10/09/1999    Years since quitting: 20.0  . Smokeless tobacco: Never Used  Substance and Sexual Activity  . Alcohol use: Yes    Comment: 1 glass of white wine monthly  . Drug use: No  . Sexual activity: Yes    Birth control/protection: Pill  Other Topics Concern  . Not on file  Social History Narrative  . Not on file   Social Determinants of Health   Financial Resource Strain:   . Difficulty of Paying Living Expenses: Not on file  Food Insecurity:   . Worried About Charity fundraiser in the Last Year: Not on file  . Ran Out of Food in the Last Year: Not on file  Transportation Needs:   . Lack of  Transportation (Medical): Not on file  . Lack of Transportation (Non-Medical): Not on file  Physical Activity:   . Days of Exercise per Week: Not on file  . Minutes of Exercise per Session: Not on file  Stress:   . Feeling of Stress : Not on file  Social Connections:   . Frequency of Communication with Friends and Family: Not on file  . Frequency of Social Gatherings with Friends and Family: Not on file  . Attends Religious Services: Not on file  . Active Member of Clubs or Organizations: Not on file  . Attends Archivist Meetings: Not on file  . Marital Status: Not on file  Intimate Partner Violence:   . Fear of Current or Ex-Partner: Not on file  . Emotionally Abused: Not on file  . Physically Abused: Not on file  . Sexually Abused: Not on file    Family History  Problem Relation Age of Onset  . Diabetes Father   . Early death Father   . Diabetes Mother   . COPD Mother   . Rheum arthritis Mother   . Hyperlipidemia Brother   . Thyroid disease Brother   . Alcohol abuse Neg Hx   . Cancer Neg Hx   . Depression Neg Hx   . Drug abuse Neg Hx   . Hearing loss  Neg Hx   . Heart disease Neg Hx   . Hypertension Neg Hx   . Stroke Neg Hx     Review of Systems Pertinent negatives listed in HPI Allergies  Allergen Reactions  . Codeine Hives and Nausea And Vomiting  . Tramadol Diarrhea and Nausea And Vomiting    Prior to Admission medications   Medication Sig Start Date End Date Taking? Authorizing Provider  clobetasol ointment (TEMOVATE) 9.62 % Apply 1 application topically 2 (two) times daily. 04/08/19  Yes [name], MD  fluticasone (FLONASE) 50 MCG/ACT nasal spray Place 2 sprays into the nose daily as needed for allergies.    Yes [provider]  ibuprofen (ADVIL) 800 MG tablet TAKE 1 TABLET BY MOUTH EVERY 8 HOURS AS NEEDED 09/12/19  Yes Shelly Bombard, MD  indapamide (LOZOL) 1.25 MG tablet Take 1 tablet (1.25 mg total) by mouth daily. 07/21/19  Yes  [name], MD  Insulin Pen Needle (PEN NEEDLES) 31G X 6 MM MISC Use daily to inject saxenda. 04/16/18  Yes [name], MD  levocetirizine (XYZAL) 5 MG tablet TAKE 1 TABLET BY MOUTH EVERY DAY IN THE EVENING 09/22/19  Yes [name], MD  Liraglutide -Weight Management (SAXENDA) 18 MG/3ML SOPN Inject 3 mg into the skin daily. 04/08/19  Yes [name], MD  methimazole (TAPAZOLE) 10 MG tablet Take 1 tablet (10 mg total) by mouth 2 (two) times daily. 07/10/19  Yes Shamleffer, Melanie Crazier, MD  Multiple Vitamin (MULTIVITAMIN) tablet Take 1 tablet by mouth daily.   Yes [provider]  nadolol (CORGARD) 20 MG tablet TAKE 1 TABLET BY MOUTH EVERY DAY 09/23/19  Yes Allred, Jeneen Rinks, MD  omeprazole (PRILOSEC) 40 MG capsule Take 40 mg by mouth daily.   Yes [provider]  [place] 28 0.25-35 MG-MCG tablet TAKE 1 TABLET BY MOUTH EVERY DAY 06/03/19  Yes Shelly Bombard, MD  terconazole (TERAZOL 7) 0.4 % vaginal cream Place 1 applicator vaginally at bedtime. 07/30/19  Yes Shelly Bombard, MD  tinidazole (TINDAMAX) 500 MG tablet TAKE 2 TABLETS BY MOUTH EVERY DAY WITH BREAKFAST 08/06/19  Yes Shelly Bombard, MD  tolterodine (DETROL LA) 2 MG 24 hr capsule Take 1 capsule (2 mg total) by mouth daily. 04/27/19  Yes [name], MD  Vilazodone HCl (VIIBRYD) 20 MG TABS Take 1 tablet (20 mg total) by mouth daily. 04/20/19  Yes [name], MD    Past Medical, Surgical Family and Social History reviewed and updated.    Objective:   Today's Vitals   10/14/19 1838  BP: 120/90  Pulse: 91  Temp: 98.7 F (37.1 C)  SpO2: 100%  Weight: (!) 306 lb (138.8 kg)  Height: 5' 5"  (1.651 m)    BP Readings from Last 3 Encounters:  10/14/19 120/90  10/12/19 124/86  07/30/19 (!) 142/88    Filed Weights   10/14/19 1838  Weight: (!) 306 lb (138.8 kg)       Physical Exam General appearance: alert, well developed, well nourished, cooperative and in no distress Head:  Normocephalic, without obvious abnormality, atraumatic Respiratory: Respirations even and unlabored, normal respiratory rate. Positive wheezing B/L upper bronchials. Negative rales or crackles Heart: rate and rhythm normal. No gallop or murmurs noted on exam  Extremities: No gross deformities Skin: Skin color, texture, turgor normal. No rashes seen  Psych: Appropriate mood and affect. Neurologic: Alert, oriented to person, place, and time, thought content appropriate.  No results found  for: POCGLU  Lab Results  Component Value Date   HGBA1C 5.9 04/08/2019            Assessment & Plan:  1. COVID-19 virus infection _my chart companion ordered -Education provided regarding red flags of worsening symptoms and when to follow-up. -Continue to practice 3W's  2. Bronchitis, secondary to COVID-19 -Start Augmentin BID x 10 days -Benzontate 100-200 mg PRN TID as needed for cough -  3. Chest tightness, with coughing only, secondary to bronchitis -Albuterol inhaler prescribed.  4. Morbid obesity ([place]) -Encouraged weight loss. Continue management by PCP -Pt is prescribe Saxenda for weight management   5. Graves disease -Follow-up with endocrinologist and opthalmology      Follow-up with PCP if symptoms do not improve or go immediately to the ER if breathing worsens.  Molli Barrows, FNP-C [facility], PRN Provider  [facility] [place], [place] [facility] Phone: [phone] Clinic Fax: [phone] Clinic Hours: 5:30 pm -7:30 pm (Monday, Wednesday, and Friday)

## 2019-10-15 ENCOUNTER — Encounter (INDEPENDENT_AMBULATORY_CARE_PROVIDER_SITE_OTHER): Payer: Self-pay

## 2019-10-16 ENCOUNTER — Encounter (INDEPENDENT_AMBULATORY_CARE_PROVIDER_SITE_OTHER): Payer: Self-pay

## 2019-10-16 ENCOUNTER — Telehealth: Payer: Self-pay | Admitting: *Deleted

## 2019-10-16 NOTE — Telephone Encounter (Signed)
Previously attempt to contact pt to discuss symptoms; message sent to pt via MyChart to help manage symptoms:  FLUID THERAPY DURING SEVERE DIARRHEA:  * Drink more fluids, at least 8-10 cups daily. One cup equals 8 oz (240 ml).  * WATER: Even for severe diarrhea, water is often the best liquid to drink. You should also eat some salty foods (e.g., potato chips, pretzels, saltine crackers). This is important to make sure you are getting enough salt, sugars, and fluids to meet your body's needs.  * SPORTS DRINKS: You can also drink a sports drink (e.g., Gatorade, Powerade) to help treat and prevent dehydration. For it to work best, mix it half and half with water.  * Avoid caffeinated beverages (Reason: caffeine is mildly dehydrating).  * Avoid alcohol beverages (beer, wine, hard liquor).    3: FOOD AND NUTRITION DURING SEVERE DIARRHEA:  * Drinking enough liquids is more important that eating when one has severe diarrhea.  * As the diarrhea starts to get better, you can slowly return to a normal diet.  * Begin with boiled starches / cereals (e.g., potatoes, rice, noodles, wheat, oats) with a small amount of salt to taste.  * Other foods that are OK include: bananas, yogurt, crackers, soup.   4: DIARRHEA MEDICINE - Loperamide (Imodium AD):  * This medicine helps decrease diarrhea. It is available over-the-counter (OTC) in a drug store.  * Adult dosage: 4 mg (2 capsules) is the recommended first dose. You may take an additional 2 mg (1 capsule) after each loose BM.  * Maximum dosage: 16 mg per day (8 capsules).  * Do not use for more than 2 days.   5: CAUTION - Loperamide (Imodium AD):  * DO NOT use if there is a fever over 100.4 F (38.0 C) or if there is blood or mucus in the stools.  * Read and follow the package instructions carefully.   6: DIARRHEA MEDICINE - Bismuth Subsalicylate (e.g., Kaopectate, Pepto-Bismol):  * This medicine can help reduce diarrhea, vomiting, and abdominal cramping. It is  available over-the-counter (OTC) in a drug store.  * Adult dosage: Take two tablets or two tablespoons by mouth every hour (if diarrhea continues) to a maximum of 8 doses in a 24 hour period.  * Do not use for more than 2 days.   7: CAUTION - Bismuth Subsalicylate (e.g., Kaopectate, Pepto-Bismol):  * May cause a temporary darkening of stool and tongue.  * Do not use if allergic to aspirin.  * Do not use in pregnancy.  * Read and follow the package instructions carefully.   8: CONTAGIOUSNESS:  * Be certain to wash your hands after using the restroom.  * If your work is cooking, Research scientist (medical), serving or preparing food, then you should not work until the diarrhea has completely stopped.   9: CALL BACK IF:  * Signs of dehydration occur (e.g., no urine over 12 hours, very dry mouth, lightheaded, etc.)  * Bloody stools  * Constant or severe abdominal pain  * You become worse.   10: CARE ADVICE given per Diarrhea (Adult) guideline.   Pt also advised "Please call back if you need additional questions or need further assistance; she sees Dr Scarlette Calico; will route to provider for notification.

## 2019-10-16 NOTE — Telephone Encounter (Signed)
Received BPA from [place] encounter 10/16/19; pt complained of new diarrhea; attempted to contact pt to discuss symptoms; left message on voicemail.

## 2019-10-17 ENCOUNTER — Encounter (INDEPENDENT_AMBULATORY_CARE_PROVIDER_SITE_OTHER): Payer: Self-pay

## 2019-10-18 ENCOUNTER — Encounter: Payer: Self-pay | Admitting: Internal Medicine

## 2019-10-18 ENCOUNTER — Encounter (INDEPENDENT_AMBULATORY_CARE_PROVIDER_SITE_OTHER): Payer: Self-pay

## 2019-10-19 ENCOUNTER — Encounter (INDEPENDENT_AMBULATORY_CARE_PROVIDER_SITE_OTHER): Payer: Self-pay

## 2019-10-19 NOTE — Telephone Encounter (Signed)
Have you seen this message?

## 2019-10-20 ENCOUNTER — Encounter: Payer: Self-pay | Admitting: Family Medicine

## 2019-10-20 ENCOUNTER — Encounter (INDEPENDENT_AMBULATORY_CARE_PROVIDER_SITE_OTHER): Payer: Self-pay

## 2019-10-21 ENCOUNTER — Encounter (INDEPENDENT_AMBULATORY_CARE_PROVIDER_SITE_OTHER): Payer: Self-pay

## 2019-10-22 ENCOUNTER — Encounter (INDEPENDENT_AMBULATORY_CARE_PROVIDER_SITE_OTHER): Payer: Self-pay

## 2019-10-23 ENCOUNTER — Other Ambulatory Visit: Payer: Self-pay | Admitting: Obstetrics

## 2019-10-23 ENCOUNTER — Encounter (INDEPENDENT_AMBULATORY_CARE_PROVIDER_SITE_OTHER): Payer: Self-pay

## 2019-10-23 ENCOUNTER — Other Ambulatory Visit: Payer: Self-pay | Admitting: Internal Medicine

## 2019-10-23 DIAGNOSIS — F418 Other specified anxiety disorders: Secondary | ICD-10-CM

## 2019-10-23 DIAGNOSIS — N3281 Overactive bladder: Secondary | ICD-10-CM

## 2019-10-23 DIAGNOSIS — N898 Other specified noninflammatory disorders of vagina: Secondary | ICD-10-CM

## 2019-10-26 ENCOUNTER — Encounter (INDEPENDENT_AMBULATORY_CARE_PROVIDER_SITE_OTHER): Payer: Self-pay

## 2019-10-27 ENCOUNTER — Other Ambulatory Visit: Payer: Self-pay | Admitting: Internal Medicine

## 2019-10-28 ENCOUNTER — Other Ambulatory Visit: Payer: Self-pay | Admitting: Internal Medicine

## 2019-11-06 ENCOUNTER — Other Ambulatory Visit: Payer: Self-pay

## 2019-11-06 ENCOUNTER — Other Ambulatory Visit: Payer: Self-pay | Admitting: Internal Medicine

## 2019-11-06 DIAGNOSIS — I10 Essential (primary) hypertension: Secondary | ICD-10-CM

## 2019-11-06 MED ORDER — NADOLOL 20 MG PO TABS: 20.0000 mg | ORAL_TABLET | Freq: Every day | ORAL | 0 refills | Status: DC

## 2019-11-08 MED ORDER — INDAPAMIDE 1.25 MG PO TABS: 1.2500 mg | ORAL_TABLET | Freq: Every day | ORAL | 0 refills | Status: DC

## 2019-11-09 ENCOUNTER — Other Ambulatory Visit: Payer: Self-pay | Admitting: Internal Medicine

## 2019-11-09 NOTE — Progress Notes (Signed)
PCP:  Alicia Lima, MD Primary Cardiologist: No primary care provider on file. Electrophysiologist: Dr. Kingsley Hoover is a 44 y.o. female with past medical history of Palpitations/PVCs/PACs, OSA, and Obesity who presents today for routine electrophysiology followup. They are seen for Dr. Rayann Hoover.   Since last being seen in our clinic, the patient reports doing well overall, apart from a diagnosis of graves disease and recently dealing with COVID. She still has not gotten her smell/taste back. She remains SOB with exertion, more so since COVID, but is optimistic about improving. She is working towards fitting in a [employer] for this summer.  She is being treated for her Graves disease with recent improving labs, and being seen for weight loss as well.   The patient feels that she is tolerating medications without difficulties and is otherwise without complaint today.   Past Medical History:  Diagnosis Date  . Asthma   . GERD (gastroesophageal reflux disease)   . Hypersomnia with sleep apnea   . Hypertension   . Migraine   . Rhinitis    Past Surgical History:  Procedure Laterality Date  . CHOLECYSTECTOMY    . TOOTH EXTRACTION      Current Outpatient Medications  Medication Sig Dispense Refill  . albuterol (VENTOLIN HFA) 108 (90 Base) MCG/ACT inhaler Inhale 2 puffs into the lungs every 4 (four) hours as needed for wheezing or shortness of breath (cough, shortness of breath or wheezing.). 18 g 0  . clobetasol ointment (TEMOVATE) 6.20 % Apply 1 application topically 2 (two) times daily. 60 g 1  . fluticasone (FLONASE) 50 MCG/ACT nasal spray Place 2 sprays into the nose daily as needed for allergies.     Marland Kitchen ibuprofen (ADVIL) 800 MG tablet TAKE 1 TABLET BY MOUTH EVERY 8 HOURS AS NEEDED 30 tablet 5  . indapamide (LOZOL) 1.25 MG tablet Take 1 tablet (1.25 mg total) by mouth daily. 90 tablet 0  . Insulin Pen Needle (PEN NEEDLES) 31G X 6 MM MISC Use daily to inject saxenda.  30 each 2  . levocetirizine (XYZAL) 5 MG tablet TAKE 1 TABLET BY MOUTH EVERY DAY IN THE EVENING 90 tablet 1  . Liraglutide -Weight Management (SAXENDA) 18 MG/3ML SOPN Inject 3 mg into the skin daily. 5 pen 5  . methimazole (TAPAZOLE) 10 MG tablet TAKE 1 TABLET BY MOUTH TWICE A DAY 180 tablet 1  . Multiple Vitamin (MULTIVITAMIN) tablet Take 1 tablet by mouth daily.    . nadolol (CORGARD) 20 MG tablet Take 1 tablet (20 mg total) by mouth daily. Pt needs to call and make appt with provider for further refills - 3nd attempt-no further refills without appt 15 tablet 0  . omeprazole (PRILOSEC) 40 MG capsule Take 40 mg by mouth daily.    . SPRINTEC 28 0.25-35 MG-MCG tablet TAKE 1 TABLET BY MOUTH EVERY DAY 84 tablet 5  . terconazole (TERAZOL 7) 0.4 % vaginal cream PLACE 1 APPLICATOR VAGINALLY AT BEDTIME. 45 g 0  . tinidazole (TINDAMAX) 500 MG tablet Take 1,000 mg by mouth as needed.    . tolterodine (DETROL LA) 2 MG 24 hr capsule TAKE 1 CAPSULE (2 MG TOTAL) BY MOUTH DAILY. 90 capsule 1  . VIIBRYD 20 MG TABS TAKE 1 TABLET BY MOUTH EVERY DAY 90 tablet 1   No current facility-administered medications for this visit.    Allergies  Allergen Reactions  . Codeine Hives and Nausea And Vomiting  . Tramadol Diarrhea and Nausea And  Vomiting    Social History   Socioeconomic History  . Marital status: Widowed    Spouse name: Not on file  . Number of children: 3  . Years of education: Not on file  . Highest education level: Not on file  Occupational History  . Occupation: [employer]    Employer: [employer]  Tobacco Use  . Smoking status: Former Smoker    Packs/day: 0.10    Years: 1.00    Pack years: 0.10    Types: Cigarettes    Quit date: 10/09/1999    Years since quitting: 20.1  . Smokeless tobacco: Never Used  Substance and Sexual Activity  . Alcohol use: Yes    Comment: 1 glass of white wine monthly  . Drug use: No  . Sexual activity: Yes    Birth control/protection: Pill  Other  Topics Concern  . Not on file  Social History Narrative  . Not on file   Social Determinants of Health   Financial Resource Strain:   . Difficulty of Paying Living Expenses: Not on file  Food Insecurity:   . Worried About Charity fundraiser in the Last Year: Not on file  . Ran Out of Food in the Last Year: Not on file  Transportation Needs:   . Lack of Transportation (Medical): Not on file  . Lack of Transportation (Non-Medical): Not on file  Physical Activity:   . Days of Exercise per Week: Not on file  . Minutes of Exercise per Session: Not on file  Stress:   . Feeling of Stress : Not on file  Social Connections:   . Frequency of Communication with Friends and Family: Not on file  . Frequency of Social Gatherings with Friends and Family: Not on file  . Attends Religious Services: Not on file  . Active Member of Clubs or Organizations: Not on file  . Attends Archivist Meetings: Not on file  . Marital Status: Not on file  Intimate Partner Violence:   . Fear of Current or Ex-Partner: Not on file  . Emotionally Abused: Not on file  . Physically Abused: Not on file  . Sexually Abused: Not on file     Review of Systems: General: No chills, fever, night sweats or weight changes  Cardiovascular:  No chest pain, dyspnea on exertion, edema, orthopnea, palpitations, paroxysmal nocturnal dyspnea Dermatological: No rash, lesions or masses Respiratory: No cough, dyspnea Urologic: No hematuria, dysuria Abdominal: No nausea, vomiting, diarrhea, bright red blood per rectum, melena, or hematemesis Neurologic: No visual changes, weakness, changes in mental status All other systems reviewed and are otherwise negative except as noted above.  Physical Exam: Vitals:   11/10/19 0845  BP: 124/82  Pulse: 85  SpO2: 98%  Weight: (!) 306 lb 12.8 oz (139.2 kg)  Height: 5' 5"  (1.651 m)    GEN- The patient is well appearing, alert and oriented x 3 today.   HEENT: normocephalic,  atraumatic; sclera clear, conjunctiva pink; hearing intact; oropharynx clear; neck supple, no JVP Lymph- no cervical lymphadenopathy Lungs- Clear to ausculation bilaterally, normal work of breathing.  No wheezes, rales, rhonchi Heart- Regular rate and rhythm, no murmurs, rubs or gallops, PMI not laterally displaced GI- soft, non-tender, non-distended, bowel sounds present, no hepatosplenomegaly Extremities- no clubbing, cyanosis, or edema; DP/PT/radial pulses 2+ bilaterally MS- no significant deformity or atrophy Skin- warm and dry, no rash or lesion Psych- euthymic mood, full affect Neuro- strength and sensation are intact  EKG is  ordered. Personal review of EKG from today shows NSR 85 bpm, PR interval 170 ms, and QRS 76 ms.   Assessment and Plan:  1. Palpitations/PVCs/PACs Previous monitoring with PVC/PACs Continue lifestyle mods with exercise, weight loss, caffeine avoidance, ETOH avoidence, and CPAP compliance. Continue nadolol 10 mg daily. Can increase if needed  2. DOE ETT with no ST/T changes. Echo WNL Likely due to deconditioning. Lifestyle mods as noted above Worse after COVID. Encouraged exercise and weight loss.   3. Obesity Body mass index is 51.05 kg/m. Encouraged weight loss  4. OSA Encouraged compliance, but she is not comfortable with her CPAP. Encouraged her to focus on weight loss.   5. HTN Continue current medications.   6. Graves disease  Being managed on methimazole.  RTC to see Dr. Rayann Hoover in 1 year, sooner as needed.   Shirley Friar, PA-C  11/10/19 9:04 AM

## 2019-11-10 ENCOUNTER — Other Ambulatory Visit: Payer: Self-pay

## 2019-11-10 ENCOUNTER — Ambulatory Visit (INDEPENDENT_AMBULATORY_CARE_PROVIDER_SITE_OTHER): Payer: BC Managed Care – PPO | Admitting: Student

## 2019-11-10 ENCOUNTER — Encounter: Payer: Self-pay | Admitting: Student

## 2019-11-10 VITALS — BP 124/82 | HR 85 | Ht 65.0 in | Wt 306.8 lb

## 2019-11-10 DIAGNOSIS — G4733 Obstructive sleep apnea (adult) (pediatric): Secondary | ICD-10-CM

## 2019-11-10 DIAGNOSIS — R0602 Shortness of breath: Secondary | ICD-10-CM

## 2019-11-10 DIAGNOSIS — I10 Essential (primary) hypertension: Secondary | ICD-10-CM | POA: Diagnosis not present

## 2019-11-10 DIAGNOSIS — R002 Palpitations: Secondary | ICD-10-CM

## 2019-11-10 NOTE — Patient Instructions (Signed)
Medication Instructions:  none *If you need a refill on your cardiac medications before your next appointment, please call your pharmacy*  Lab Work: none If you have labs (blood work) drawn today and your tests are completely normal, you will receive your results only by: Marland Kitchen MyChart Message (if you have MyChart) OR . A paper copy in the mail If you have any lab test that is abnormal or we need to change your treatment, we will call you to review the results.  Testing/Procedures: none  Follow-Up: At [facility], you and your health needs are our priority.  As part of our continuing mission to provide you with exceptional heart care, we have created designated Provider Care Teams.  These Care Teams include your primary Cardiologist (physician) and Advanced Practice Providers (APPs -  Physician Assistants and Nurse Practitioners) who all work together to provide you with the care you need, when you need it.  Your next appointment:   1 year(s)  The format for your next appointment:   Either In Person or Virtual  Provider:   Dr Rayann Heman  Other Instructions

## 2019-11-16 ENCOUNTER — Other Ambulatory Visit: Payer: Self-pay | Admitting: Internal Medicine

## 2019-11-16 ENCOUNTER — Encounter: Payer: Self-pay | Admitting: Internal Medicine

## 2019-11-16 DIAGNOSIS — F418 Other specified anxiety disorders: Secondary | ICD-10-CM

## 2019-11-16 MED ORDER — VIIBRYD 20 MG PO TABS: 1.0000 | ORAL_TABLET | Freq: Every day | ORAL | 1 refills | Status: DC

## 2019-11-19 NOTE — Telephone Encounter (Signed)
Saxenda Key: [id number]

## 2019-11-30 ENCOUNTER — Other Ambulatory Visit: Payer: Self-pay

## 2019-11-30 ENCOUNTER — Other Ambulatory Visit (INDEPENDENT_AMBULATORY_CARE_PROVIDER_SITE_OTHER): Payer: BC Managed Care – PPO

## 2019-11-30 DIAGNOSIS — E0500 Thyrotoxicosis with diffuse goiter without thyrotoxic crisis or storm: Secondary | ICD-10-CM

## 2019-11-30 LAB — T4, FREE: Free T4: 0.98 ng/dL (ref 0.60–1.60)

## 2019-11-30 LAB — TSH: TSH: <0.01 u[IU]/mL — ABNORMAL LOW (ref 0.35–4.50)

## 2019-12-21 ENCOUNTER — Encounter: Payer: Self-pay | Admitting: Internal Medicine

## 2019-12-22 ENCOUNTER — Ambulatory Visit (INDEPENDENT_AMBULATORY_CARE_PROVIDER_SITE_OTHER): Payer: BC Managed Care – PPO

## 2019-12-22 ENCOUNTER — Encounter: Payer: Self-pay | Admitting: Internal Medicine

## 2019-12-22 ENCOUNTER — Ambulatory Visit (INDEPENDENT_AMBULATORY_CARE_PROVIDER_SITE_OTHER): Payer: BC Managed Care – PPO | Admitting: Internal Medicine

## 2019-12-22 ENCOUNTER — Other Ambulatory Visit: Payer: Self-pay

## 2019-12-22 VITALS — BP 118/82 | HR 68 | Temp 98.5°F | Ht 65.0 in | Wt 308.5 lb

## 2019-12-22 DIAGNOSIS — G8929 Other chronic pain: Secondary | ICD-10-CM | POA: Diagnosis not present

## 2019-12-22 DIAGNOSIS — M25561 Pain in right knee: Secondary | ICD-10-CM

## 2019-12-22 DIAGNOSIS — M79661 Pain in right lower leg: Secondary | ICD-10-CM

## 2019-12-22 MED ORDER — DICLOFENAC SODIUM 75 MG PO TBEC: 75.0000 mg | DELAYED_RELEASE_TABLET | Freq: Two times a day (BID) | ORAL | 0 refills | Status: DC

## 2019-12-22 NOTE — Progress Notes (Signed)
   Subjective:   Patient ID: Alicia Hoover, female    DOB: Jun 05, 1976, 44 y.o.   MRN: [id number]  HPI The patient is a 44 YO female coming in for concerns about right knee and right calf pain. Started some time ago with pain in the right hip and leg. She has had MRI hip/l-spine with Dr. Mardelle Matte in 2015 and they were unable to find out what is going on. 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 This gets better with walking for a bit. She is having also a separate knot and pain in the calf. In recent weeks the pain in the knee has changed and she is having more pain in the posterior of the knee and in the calf region. Some swelling in the legs with right more than left. She is currently using mobic at night time which helps with sleeping but she feels not very effective during the day.   Review of Systems  Constitutional: Positive for activity change.  HENT: Negative.   Eyes: Negative.   Respiratory: Negative for cough, chest tightness and shortness of breath.   Cardiovascular: Positive for leg swelling. Negative for chest pain and palpitations.  Gastrointestinal: Negative for abdominal distention, abdominal pain, constipation, diarrhea, nausea and vomiting.  Musculoskeletal: Positive for arthralgias and myalgias.  Skin: Negative.   Neurological: Negative.   Psychiatric/Behavioral: Negative.     Objective:  Physical Exam Constitutional:      Appearance: She is well-developed. She is obese.  HENT:     Head: Normocephalic and atraumatic.  Cardiovascular:     Rate and Rhythm: Normal rate and regular rhythm.  Pulmonary:     Effort: Pulmonary effort is normal. No respiratory distress.     Breath sounds: Normal breath sounds. No wheezing or rales.  Abdominal:     General: Bowel sounds are normal. There is no distension.     Palpations: Abdomen is soft.     Tenderness: There is no abdominal tenderness. There is no rebound.    Musculoskeletal:        General: Tenderness present.     Cervical back: Normal range of motion.     Comments: Pain lateral and posterior right knee, pain in the right calf to palpation small nodule to palpation in the calf region  Skin:    General: Skin is warm and dry.  Neurological:     Mental Status: She is alert and oriented to person, place, and time.     Coordination: Coordination normal.     Vitals:   12/22/19 1549  BP: 118/82  Pulse: 68  Temp: 98.5 F (36.9 C)  TempSrc: Oral  SpO2: 98%  Weight: (!) 308 lb 8 oz (139.9 kg)  Height: 5' 5"  (1.651 m)    This visit occurred during the SARS-CoV-2 public health emergency.  Safety protocols were in place, including screening questions prior to the visit, additional usage of staff PPE, and extensive cleaning of exam room while observing appropriate contact time as indicated for disinfecting solutions.   Assessment & Plan:

## 2019-12-22 NOTE — Patient Instructions (Signed)
We will get the ultrasound of the leg and the x-ray of the knee to check for the cause of the problems.

## 2019-12-23 DIAGNOSIS — M25561 Pain in right knee: Secondary | ICD-10-CM | POA: Insufficient documentation

## 2019-12-23 DIAGNOSIS — M79661 Pain in right lower leg: Secondary | ICD-10-CM | POA: Insufficient documentation

## 2019-12-23 NOTE — Assessment & Plan Note (Signed)
Ordered US to rule out DVT as she has several risk factors including OCPs and morbid obesity. This would also show if she has ruptured baker's cyst (prior baker's cyst on the left in 2018).

## 2019-12-23 NOTE — Assessment & Plan Note (Signed)
Ordered x-ray to check on osteoarthritis. It sounds to be related to this as she has prolonged sitting causing pain and stiffness until she moves around enough. Rx diclofenac as she is currently taking mobic at night time and not getting enough daytime relief and would benefit from BID dosing rather than daily dosing.

## 2020-01-06 ENCOUNTER — Ambulatory Visit (HOSPITAL_COMMUNITY)
Admission: RE | Admit: 2020-01-06 | Discharge: 2020-01-06 | Disposition: A | Discharge status: Home / Self Care | Payer: BC Managed Care – PPO | Source: Ambulatory Visit | Attending: Internal Medicine | Admitting: Internal Medicine

## 2020-01-06 ENCOUNTER — Other Ambulatory Visit: Payer: Self-pay

## 2020-01-06 DIAGNOSIS — M79661 Pain in right lower leg: Secondary | ICD-10-CM

## 2020-01-13 NOTE — Progress Notes (Deleted)
Name: Alicia Hoover  MRN/ DOB: [id number], 10-25-1975    Age/ Sex: 44 y.o., female     PCP: Alicia Lima, MD   Reason for Endocrinology Evaluation: Hyperthyroidism     Initial Endocrinology Clinic Visit: 07/10/2019    PATIENT IDENTIFIER: [name] is a 44 y.o., female with a past medical history of HTN, Obesity and Depression. She has followed with [facility] since 07/10/2019 for consultative assistance with management of her hyperthyroidism  HISTORICAL SUMMARY: The patient was first diagnosed with subclinical hyperthyroidism in 04/2019 with a TSh of 0.03 uIU/mL, with elevated total T4 at 14.7 mcg/dL.  Repeat labs by 07/2019 confirmed suppressed TSH and an elevated free T4 at 2.68 ng/dL.  Methimazole was started 07/2019  No Amiodarone use in the past Was on Biotin until 05/2019  No radiation exposure.    Brother with thyroid disease   SUBJECTIVE:   During last visit (10/12/2019): Continued methimazole.     Today (01/14/2020):  Alicia Hoover is here for a 3 month follow up on hyperthyroidism secondary to Graves' disease.    She denies weight gain but endorses palpitations. She continues with heat intolerance. No nausea or vomiting Denies diarrhea. Denies local neck symptoms      ROS:  As per HPI.   HISTORY:  Past Medical History:  Past Medical History:  Diagnosis Date  . Asthma   . GERD (gastroesophageal reflux disease)   . Hypersomnia with sleep apnea   . Hypertension   . Migraine   . Rhinitis    Past Surgical History:  Past Surgical History:  Procedure Laterality Date  . CHOLECYSTECTOMY    . TOOTH EXTRACTION      Social History:  reports that she quit smoking about 20 years ago. Her smoking use included cigarettes. She has a 0.10 pack-year smoking history. She has never used smokeless tobacco. She reports current alcohol use. She reports that she does not use drugs. Family History:  Family History  Problem Relation Age of  Onset  . Diabetes Father   . Early death Father   . Diabetes Mother   . COPD Mother   . Rheum arthritis Mother   . Hyperlipidemia Brother   . Thyroid disease Brother   . Alcohol abuse Neg Hx   . Cancer Neg Hx   . Depression Neg Hx   . Drug abuse Neg Hx   . Hearing loss Neg Hx   . Heart disease Neg Hx   . Hypertension Neg Hx   . Stroke Neg Hx      HOME MEDICATIONS: Allergies as of 01/14/2020      Reactions   Codeine Hives, Nausea And Vomiting   Tramadol Diarrhea, Nausea And Vomiting      Medication List       Accurate as of January 14, 2020  7:26 AM. If you have any questions, ask your nurse or doctor.        albuterol 108 (90 Base) MCG/ACT inhaler Commonly known as: VENTOLIN HFA Inhale 2 puffs into the lungs every 4 (four) hours as needed for wheezing or shortness of breath (cough, shortness of breath or wheezing.).   clobetasol ointment 0.05 % Commonly known as: TEMOVATE Apply 1 application topically 2 (two) times daily.   diclofenac 75 MG EC tablet Commonly known as: VOLTAREN Take 1 tablet (75 mg total) by mouth 2 (two) times daily.   fluticasone 50 MCG/ACT nasal spray Commonly known as: FLONASE Place 2 sprays into the  nose daily as needed for allergies.   indapamide 1.25 MG tablet Commonly known as: LOZOL Take 1 tablet (1.25 mg total) by mouth daily.   levocetirizine 5 MG tablet Commonly known as: XYZAL TAKE 1 TABLET BY MOUTH EVERY DAY IN THE EVENING   methimazole 10 MG tablet Commonly known as: TAPAZOLE TAKE 1 TABLET BY MOUTH TWICE A DAY   multivitamin tablet Take 1 tablet by mouth daily.   nadolol 20 MG tablet Commonly known as: CORGARD Take 1 tablet (20 mg total) by mouth daily.   omeprazole 40 MG capsule Commonly known as: PRILOSEC Take 40 mg by mouth daily.   Pen Needles 31G X 6 MM Misc Use daily to inject saxenda.   Saxenda 18 MG/3ML Sopn Generic drug: Liraglutide -Weight Management Inject 3 mg into the skin daily.   Sprintec 28  0.25-35 MG-MCG tablet Generic drug: norgestimate-ethinyl estradiol TAKE 1 TABLET BY MOUTH EVERY DAY   terconazole 0.4 % vaginal cream Commonly known as: TERAZOL 7 PLACE 1 APPLICATOR VAGINALLY AT BEDTIME.   tinidazole 500 MG tablet Commonly known as: TINDAMAX Take 1,000 mg by mouth as needed.   tolterodine 2 MG 24 hr capsule Commonly known as: DETROL LA TAKE 1 CAPSULE (2 MG TOTAL) BY MOUTH DAILY.   Viibryd 20 MG Tabs Generic drug: Vilazodone HCl Take 1 tablet (20 mg total) by mouth daily.         OBJECTIVE:   PHYSICAL EXAM: VS: LMP 12/22/2019    EXAM: General: Pt appears well and is in NAD  Eyes: External eye exam normal without stare, lid lag or exophthalmos.  EOM intact.    Neck: General: Supple without adenopathy. Thyroid: Thyroid size normal.  No goiter or nodules appreciated. No thyroid bruit.  Lungs: Clear with good BS bilat with no rales, rhonchi, or wheezes  Heart: Auscultation: RRR.  Abdomen: Normoactive bowel sounds, soft, nontender, without masses or organomegaly palpable  Extremities: BL LE: No pretibial edema normal ROM and strength.  Neuro: Cranial nerves: II - XII grossly intact  Motor: Normal strength throughout DTRs: 2+ and symmetric in UE without delay in relaxation phase  Mental Status: Judgment, insight: Intact Orientation: Oriented to time, place, and person Mood and affect: No depression, anxiety, or agitation     DATA REVIEWED:  Results for Alicia, Hoover (MRN [id number]) as of 10/12/2019 15:41  Ref. Range 07/10/2019 09:23 10/12/2019 09:27  TSH Latest Ref Range: 0.35 - 4.50 uIU/mL <0.01 (L) <0.01 (L)  T4,Free(Direct) Latest Ref Range: 0.60 - 1.60 ng/dL 2.68 (H) 1.02     ASSESSMENT / PLAN / RECOMMENDATIONS:   1. Hyperthyroidism Secondary to Graves' Disease:   -  Clinically she is euthyroid  - No local neck symptoms - FT4 is normal , TSH still low, will continue current dose of methimazole  - Tolerating methimazole without side  effects.    Medications   Methimazole 10 mg , 2 tabs daily     2. Graves' Disease:    - No extrathyroidal manifestations of Grave's disease. Pt urged to notify her ophthalmologist about this diagnosis.   F/U in 3 months Labs in 6 weeks     Signed electronically by: Mack Guise, MD  [facility]  [facility] [address] [place], [place] [address] Phone: [phone] FAX: [phone]      CC: Alicia Lima, MD [place] [address] Phone: [phone]  Fax: [phone]   Return to Endocrinology clinic as below: Future  Appointments  Date Time Provider Central  01/14/2020  7:50 AM Melton Walls, Melanie Crazier, MD [facility] None

## 2020-01-14 ENCOUNTER — Ambulatory Visit: Payer: BC Managed Care – PPO | Admitting: Internal Medicine

## 2020-01-14 ENCOUNTER — Other Ambulatory Visit: Payer: Self-pay | Admitting: Internal Medicine

## 2020-01-14 DIAGNOSIS — Z0289 Encounter for other administrative examinations: Secondary | ICD-10-CM

## 2020-01-15 ENCOUNTER — Other Ambulatory Visit: Payer: Self-pay | Admitting: Internal Medicine

## 2020-02-04 ENCOUNTER — Encounter: Payer: Self-pay | Admitting: Internal Medicine

## 2020-02-12 ENCOUNTER — Other Ambulatory Visit: Payer: Self-pay | Admitting: Internal Medicine

## 2020-02-29 ENCOUNTER — Other Ambulatory Visit: Payer: Self-pay | Admitting: Internal Medicine

## 2020-02-29 ENCOUNTER — Other Ambulatory Visit: Payer: Self-pay | Admitting: Obstetrics

## 2020-02-29 DIAGNOSIS — L239 Allergic contact dermatitis, unspecified cause: Secondary | ICD-10-CM

## 2020-02-29 NOTE — Telephone Encounter (Signed)
Name from pharmacy: IBUPROFEN 800 MG TABLET       Will file in chart as: ibuprofen (ADVIL) 800 MG tablet   The original prescription was discontinued on 12/23/2019 by Hoyt Koch, MD. Renewing this prescription may not be appropriate.   Sig: TAKE 1 TABLET BY MOUTH EVERY 8 HOURS AS NEEDED   Disp:  30 tablet    Refills:  5   Start: 02/29/2020   Class: Normal   Non-formulary   Last ordered: 5 months ago by Shelly Bombard, MD Last refill: 02/04/2020   Rx #: [id number]     To be filled at: [id number] [place], [place] - [address]

## 2020-03-02 ENCOUNTER — Telehealth (INDEPENDENT_AMBULATORY_CARE_PROVIDER_SITE_OTHER): Payer: BC Managed Care – PPO | Admitting: Internal Medicine

## 2020-03-02 DIAGNOSIS — J019 Acute sinusitis, unspecified: Secondary | ICD-10-CM | POA: Diagnosis not present

## 2020-03-02 MED ORDER — AMOXICILLIN-POT CLAVULANATE 875-125 MG PO TABS
1.0000 | ORAL_TABLET | Freq: Two times a day (BID) | ORAL | 0 refills | Status: DC
Start: 2020-03-02 — End: 2020-04-12

## 2020-03-02 NOTE — Progress Notes (Signed)
Virtual Visit via Video Note  I connected with Alicia Hoover on 03/02/20 at 11:00 AM EDT by a video enabled telemedicine application and verified that I am speaking with the correct person using two identifiers.  The patient and the provider were at separate locations throughout the entire encounter. Patient location: home, Provider location: work   I discussed the limitations of evaluation and management by telemedicine and the availability of in person appointments. The patient expressed understanding and agreed to proceed. The patient and the provider were the only parties present for the visit unless noted in HPI below.  History of Present Illness: The patient is a 44 y.o. female with visit for sinus problems. Started about 1-2 weeks ago but worsening in the last 2 days. She has had some allergy symptoms. She does take flonase and xyzal through allergy season and sometimes year round. She does not feel that they have been helping. Now having ear drainage clear fluid and sinus pain. Some headaches. Some post nasal drip. Denies loss of taste/smell. Denies fevers or chills. Denies muscle aches. Overall it is worsening.   Observations/Objective: Appearance: normal, breathing appears normal, no coughing or dyspnea during visit, casual grooming, abdomen does not appear distended, throat with drainage not visualized well, memory normal, mental status is A and O times 3  Assessment and Plan: See problem oriented charting  Follow Up Instructions: rx augmentin to cover ear/sinus infection  I discussed the assessment and treatment plan with the patient. The patient was provided an opportunity to ask questions and all were answered. The patient agreed with the plan and demonstrated an understanding of the instructions.   The patient was advised to call back or seek an in-person evaluation if the symptoms worsen or if the condition fails to improve as anticipated.  Hoyt Koch, MD

## 2020-03-03 ENCOUNTER — Encounter: Payer: Self-pay | Admitting: Internal Medicine

## 2020-03-03 NOTE — Assessment & Plan Note (Signed)
Rx augmentin to cover for sinus/ear infection. Return for visit if not improving.

## 2020-03-08 ENCOUNTER — Other Ambulatory Visit: Payer: Self-pay | Admitting: Internal Medicine

## 2020-03-21 ENCOUNTER — Telehealth: Payer: Self-pay

## 2020-03-21 NOTE — Telephone Encounter (Signed)
Key: [id number]

## 2020-03-24 NOTE — Telephone Encounter (Signed)
   Cover MyMeds requesting call back [phone]

## 2020-03-25 ENCOUNTER — Other Ambulatory Visit: Payer: Self-pay | Admitting: Internal Medicine

## 2020-03-25 NOTE — Telephone Encounter (Signed)
Contacted Cover my Meds, they stated that the rx plan for pt allows for another PA to be submitted before moving on to an Appeal.    Key: [id number]

## 2020-04-10 ENCOUNTER — Other Ambulatory Visit: Payer: Self-pay | Admitting: Internal Medicine

## 2020-04-10 ENCOUNTER — Other Ambulatory Visit: Payer: Self-pay | Admitting: Obstetrics

## 2020-04-10 DIAGNOSIS — N3281 Overactive bladder: Secondary | ICD-10-CM

## 2020-04-10 DIAGNOSIS — N760 Acute vaginitis: Secondary | ICD-10-CM

## 2020-04-12 ENCOUNTER — Ambulatory Visit (INDEPENDENT_AMBULATORY_CARE_PROVIDER_SITE_OTHER): Payer: BC Managed Care – PPO | Admitting: Internal Medicine

## 2020-04-12 ENCOUNTER — Encounter: Payer: Self-pay | Admitting: Internal Medicine

## 2020-04-12 ENCOUNTER — Other Ambulatory Visit: Payer: Self-pay

## 2020-04-12 VITALS — BP 128/80 | HR 66 | Temp 98.9°F | Resp 16 | Ht 65.0 in | Wt 323.2 lb

## 2020-04-12 DIAGNOSIS — I10 Essential (primary) hypertension: Secondary | ICD-10-CM | POA: Diagnosis not present

## 2020-04-12 DIAGNOSIS — Z0000 Encounter for general adult medical examination without abnormal findings: Secondary | ICD-10-CM | POA: Diagnosis not present

## 2020-04-12 DIAGNOSIS — H6982 Other specified disorders of Eustachian tube, left ear: Secondary | ICD-10-CM

## 2020-04-12 DIAGNOSIS — J301 Allergic rhinitis due to pollen: Secondary | ICD-10-CM | POA: Insufficient documentation

## 2020-04-12 DIAGNOSIS — R7303 Prediabetes: Secondary | ICD-10-CM | POA: Diagnosis not present

## 2020-04-12 DIAGNOSIS — N3281 Overactive bladder: Secondary | ICD-10-CM

## 2020-04-12 DIAGNOSIS — E0500 Thyrotoxicosis with diffuse goiter without thyrotoxic crisis or storm: Secondary | ICD-10-CM | POA: Diagnosis not present

## 2020-04-12 LAB — HEPATIC FUNCTION PANEL
ALT: 19 U/L (ref 0–35)
AST: 17 U/L (ref 0–37)
Albumin: 3.8 g/dL (ref 3.5–5.2)
Alkaline Phosphatase: 72 U/L (ref 39–117)
Bilirubin, Direct: 0.1 mg/dL (ref 0.0–0.3)
Total Bilirubin: 0.4 mg/dL (ref 0.2–1.2)
Total Protein: 6.7 g/dL (ref 6.0–8.3)

## 2020-04-12 LAB — CBC WITH DIFFERENTIAL/PLATELET
Basophils Absolute: 0 10*3/uL (ref 0.0–0.1)
Basophils Relative: 0.4 % (ref 0.0–3.0)
Eosinophils Absolute: 0 10*3/uL (ref 0.0–0.7)
Eosinophils Relative: 0.2 % (ref 0.0–5.0)
HCT: 39.8 % (ref 36.0–46.0)
Hemoglobin: 13.1 g/dL (ref 12.0–15.0)
Lymphocytes Relative: 27.6 % (ref 12.0–46.0)
Lymphs Abs: 2.3 10*3/uL (ref 0.7–4.0)
MCHC: 33 g/dL (ref 30.0–36.0)
MCV: 80.2 fl (ref 78.0–100.0)
Monocytes Absolute: 0.7 10*3/uL (ref 0.1–1.0)
Monocytes Relative: 9 % (ref 3.0–12.0)
Neutro Abs: 5.2 10*3/uL (ref 1.4–7.7)
Neutrophils Relative %: 62.8 % (ref 43.0–77.0)
Platelets: 356 10*3/uL (ref 150.0–400.0)
RBC: 4.96 Mil/uL (ref 3.87–5.11)
RDW: 14.9 % (ref 11.5–15.5)
WBC: 8.3 10*3/uL (ref 4.0–10.5)

## 2020-04-12 LAB — BASIC METABOLIC PANEL
BUN: 16 mg/dL (ref 6–23)
CO2: 29 mEq/L (ref 19–32)
Calcium: 9.2 mg/dL (ref 8.4–10.5)
Chloride: 101 mEq/L (ref 96–112)
Creatinine, Ser: 0.97 mg/dL (ref 0.40–1.20)
GFR: 75.09 mL/min (ref >60.00)
Glucose, Bld: 98 mg/dL (ref 70–99)
Potassium: 4.1 mEq/L (ref 3.5–5.1)
Sodium: 137 mEq/L (ref 135–145)

## 2020-04-12 LAB — LIPID PANEL
Cholesterol: 148 mg/dL (ref 0–200)
HDL: 39.9 mg/dL (ref >39.00)
LDL Cholesterol: 79 mg/dL (ref 0–99)
NonHDL: 108.32
Total CHOL/HDL Ratio: 4
Triglycerides: 147 mg/dL (ref 0.0–149.0)
VLDL: 29.4 mg/dL (ref 0.0–40.0)

## 2020-04-12 LAB — TSH: TSH: 1.47 u[IU]/mL (ref 0.35–4.50)

## 2020-04-12 LAB — HEMOGLOBIN A1C: Hgb A1c MFr Bld: 6 % (ref 4.6–6.5)

## 2020-04-12 MED ORDER — OZEMPIC (0.25 OR 0.5 MG/DOSE) 2 MG/1.5ML ~~LOC~~ SOPN: 0.5000 mg | PEN_INJECTOR | SUBCUTANEOUS | 0 refills | Status: DC

## 2020-04-12 MED ORDER — PHENTERMINE HCL 37.5 MG PO CAPS: 37.5000 mg | ORAL_CAPSULE | ORAL | 2 refills | Status: DC

## 2020-04-12 MED ORDER — MIRABEGRON ER 25 MG PO TB24: 25.0000 mg | ORAL_TABLET | Freq: Every day | ORAL | 0 refills | Status: DC

## 2020-04-12 MED ORDER — METHYLPREDNISOLONE 4 MG PO TBPK: ORAL_TABLET | ORAL | 0 refills | Status: AC

## 2020-04-12 MED ORDER — NOVOFINE 32G X 6 MM MISC: 1.0000 | 0 refills | Status: DC

## 2020-04-12 MED ORDER — MONTELUKAST SODIUM 10 MG PO TABS: 10.0000 mg | ORAL_TABLET | Freq: Every day | ORAL | 1 refills | Status: DC

## 2020-04-12 MED ORDER — SOLIFENACIN SUCCINATE 10 MG PO TABS: 10.0000 mg | ORAL_TABLET | Freq: Every day | ORAL | 1 refills | Status: DC

## 2020-04-12 NOTE — Progress Notes (Signed)
Subjective:  Patient ID: Alicia Hoover, female    DOB: [birth year]  Age: 44 y.o. MRN: [id number]  CC: Hypertension and Annual Exam  This visit occurred during the SARS-CoV-2 public health emergency.  Safety protocols were in place, including screening questions prior to the visit, additional usage of staff PPE, and extensive cleaning of exam room while observing appropriate contact time as indicated for disinfecting solutions.    HPI [name] presents for a CPX.  She complains of a several month history of worsening runny nose, nasal congestion, sneezing, and postnasal drip.  She is seeing an ENT doctor later today.  She is not getting much symptom relief with Flonase and Xyzal.  She denies facial pain, fever, or chills.  She has had some discomfort in the left ear.  She complains of weight gain.  Her insurance company will not pay for Saxenda.  She has been working on her lifestyle modifications.  She also complains of frequent urination.  She has about 2-3 episodes of nocturia.  She is not getting much symptom relief with tolterodine.  Outpatient Medications Prior to Visit  Medication Sig Dispense Refill  . clobetasol ointment (TEMOVATE) 0.05 % APPLY TO AFFECTED AREA TWICE A DAY 60 g 1  . diclofenac (VOLTAREN) 75 MG EC tablet TAKE 1 TABLET BY MOUTH TWICE A DAY 60 tablet 0  . fluticasone (FLONASE) 50 MCG/ACT nasal spray Place 2 sprays into the nose daily as needed for allergies.     . indapamide (LOZOL) 1.25 MG tablet Take 1 tablet (1.25 mg total) by mouth daily. 90 tablet 0  . levocetirizine (XYZAL) 5 MG tablet TAKE 1 TABLET BY MOUTH EVERY DAY IN THE EVENING 90 tablet 1  . methimazole (TAPAZOLE) 10 MG tablet TAKE 1 TABLET BY MOUTH TWICE A DAY 180 tablet 0  . Multiple Vitamin (MULTIVITAMIN) tablet Take 1 tablet by mouth daily.    . nadolol (CORGARD) 20 MG tablet Take 1 tablet (20 mg total) by mouth daily. 90 tablet 3  . norgestimate-ethinyl estradiol (SPRINTEC 28) 0.25-35  MG-MCG tablet Take 1 tablet by mouth daily.    Marland Kitchen omeprazole (PRILOSEC) 40 MG capsule Take 40 mg by mouth daily.    . Vilazodone HCl (VIIBRYD) 20 MG TABS Take 1 tablet (20 mg total) by mouth daily. 90 tablet 1  . tinidazole (TINDAMAX) 500 MG tablet Take 1,000 mg by mouth as needed.    . tolterodine (DETROL LA) 2 MG 24 hr capsule TAKE 1 CAPSULE (2 MG TOTAL) BY MOUTH DAILY. 90 capsule 1  . albuterol (VENTOLIN HFA) 108 (90 Base) MCG/ACT inhaler Inhale 2 puffs into the lungs every 4 (four) hours as needed for wheezing or shortness of breath (cough, shortness of breath or wheezing.). 18 g 0  . amoxicillin-clavulanate (AUGMENTIN) 875-125 MG tablet Take 1 tablet by mouth 2 (two) times daily. 14 tablet 0  . ibuprofen (ADVIL) 800 MG tablet TAKE 1 TABLET BY MOUTH EVERY 8 HOURS AS NEEDED 30 tablet 5  . Insulin Pen Needle (PEN NEEDLES) 31G X 6 MM MISC Use daily to inject saxenda. 30 each 2  . SAXENDA 18 MG/3ML SOPN INJECT 3 MG INTO THE SKIN DAILY. 3 mL 0  . SPRINTEC 28 0.25-35 MG-MCG tablet TAKE 1 TABLET BY MOUTH EVERY DAY 84 tablet 5  . terconazole (TERAZOL 7) 0.4 % vaginal cream PLACE 1 APPLICATOR VAGINALLY AT BEDTIME. 45 g 0   No facility-administered medications prior to visit.    ROS Review of Systems  Constitutional: Positive for unexpected weight change (wt gain). Negative for appetite change, chills, diaphoresis and fatigue.  HENT: Positive for congestion, postnasal drip and rhinorrhea. Negative for nosebleeds, sinus pressure, sore throat and trouble swallowing.   Eyes: Negative.   Respiratory: Negative for cough, chest tightness, shortness of breath and wheezing.   Cardiovascular: Negative for chest pain, palpitations and leg swelling.  Gastrointestinal: Negative for abdominal pain, constipation, diarrhea, nausea and vomiting.  Endocrine: Positive for polyuria. Negative for polydipsia and polyphagia.  Genitourinary: Negative.  Negative for decreased urine volume, difficulty urinating, dysuria,  hematuria and urgency.  Musculoskeletal: Negative.  Negative for arthralgias and myalgias.  Skin: Negative.  Negative for color change and pallor.  Neurological: Negative.  Negative for dizziness, weakness, light-headedness and headaches.  Hematological: Negative for adenopathy. Does not bruise/bleed easily.  Psychiatric/Behavioral: Negative.     Objective:  BP 128/80 (BP Location: Left Arm, Patient Position: Sitting, Cuff Size: Large)   Pulse 66   Temp 98.9 F (37.2 C) (Oral)   Resp 16   Ht 5' 5"  (1.651 m)   Wt (!) 323 lb 4 oz (146.6 kg)   LMP 04/08/2020   SpO2 96%   BMI 53.79 kg/m   BP Readings from Last 3 Encounters:  04/12/20 128/80  12/22/19 118/82  11/10/19 124/82    Wt Readings from Last 3 Encounters:  04/12/20 (!) 323 lb 4 oz (146.6 kg)  12/22/19 (!) 308 lb 8 oz (139.9 kg)  11/10/19 (!) 306 lb 12.8 oz (139.2 kg)    Physical Exam Vitals reviewed.  Constitutional:      Appearance: She is obese.  HENT:     Right Ear: Hearing, tympanic membrane, ear canal and external ear normal. No middle ear effusion.     Left Ear: Hearing, tympanic membrane, ear canal and external ear normal.  No middle ear effusion.     Nose: Mucosal edema and congestion present. No nasal tenderness or rhinorrhea.     Right Nostril: No epistaxis.     Left Nostril: No epistaxis.     Right Turbinates: Enlarged, swollen and pale.     Left Turbinates: Pale. Not enlarged or swollen.     Right Sinus: No maxillary sinus tenderness or frontal sinus tenderness.     Left Sinus: No maxillary sinus tenderness or frontal sinus tenderness.     Mouth/Throat:     Mouth: Mucous membranes are moist.     Pharynx: Oropharynx is clear.  Eyes:     General: No scleral icterus.    Conjunctiva/sclera: Conjunctivae normal.  Cardiovascular:     Rate and Rhythm: Normal rate and regular rhythm.     Heart sounds: No murmur heard.   Pulmonary:     Effort: Pulmonary effort is normal.     Breath sounds: No stridor.  No wheezing, rhonchi or rales.  Abdominal:     General: Abdomen is protuberant. Bowel sounds are normal. There is no distension.     Palpations: Abdomen is soft. There is no hepatomegaly, splenomegaly or mass.     Tenderness: There is no abdominal tenderness.  Musculoskeletal:        General: Normal range of motion.     Cervical back: Neck supple.     Right lower leg: No edema.     Left lower leg: No edema.  Lymphadenopathy:     Cervical: No cervical adenopathy.  Skin:    General: Skin is warm and dry.     Coloration: Skin is not pale.  Neurological:  General: No focal deficit present.     Mental Status: She is alert and oriented to person, place, and time. Mental status is at baseline.  Psychiatric:        Mood and Affect: Mood normal.        Behavior: Behavior normal.        Thought Content: Thought content normal.        Judgment: Judgment normal.     Lab Results  Component Value Date   WBC 8.3 04/12/2020   HGB 13.1 04/12/2020   HCT 39.8 04/12/2020   PLT 356.0 04/12/2020   GLUCOSE 98 04/12/2020   CHOL 148 04/12/2020   TRIG 147.0 04/12/2020   HDL 39.90 04/12/2020   LDLDIRECT 57.0 03/27/2017   LDLCALC 79 04/12/2020   ALT 19 04/12/2020   AST 17 04/12/2020   NA 137 04/12/2020   K 4.1 04/12/2020   CL 101 04/12/2020   CREATININE 0.97 04/12/2020   BUN 16 04/12/2020   CO2 29 04/12/2020   TSH 1.47 04/12/2020   HGBA1C 6.0 04/12/2020    VAS [place] LOWER EXTREMITY VENOUS (DVT)  Result Date: 01/07/2020  Lower Venous DVTStudy Indications: Pain. Other Indications: Patient complains of right calf pain for "months". She denies                    any shortness of breath. Performing Technologist: Wilkie Aye RVT  Examination Guidelines: A complete evaluation includes B-mode imaging, spectral Doppler, color Doppler, and power Doppler as needed of all accessible portions of each vessel. Bilateral testing is considered an integral part of a complete examination. Limited examinations  for reoccurring indications may be performed as noted. The reflux portion of the exam is performed with the patient in reverse Trendelenburg.  +---------+---------------+---------+-----------+----------+--------------+ RIGHT    CompressibilityPhasicitySpontaneityPropertiesThrombus Aging +---------+---------------+---------+-----------+----------+--------------+ CFV      Full           Yes      Yes                                 +---------+---------------+---------+-----------+----------+--------------+ SFJ      Full           Yes      Yes                                 +---------+---------------+---------+-----------+----------+--------------+ FV Prox  Full           Yes      Yes                                 +---------+---------------+---------+-----------+----------+--------------+ FV Mid   Full           Yes      Yes                                 +---------+---------------+---------+-----------+----------+--------------+ FV DistalFull           Yes      Yes                                 +---------+---------------+---------+-----------+----------+--------------+ PFV      Full                                                        +---------+---------------+---------+-----------+----------+--------------+  POP      Full           Yes      Yes                                 +---------+---------------+---------+-----------+----------+--------------+ PTV      Full           Yes      Yes                                 +---------+---------------+---------+-----------+----------+--------------+ PERO     Full           Yes      Yes                                 +---------+---------------+---------+-----------+----------+--------------+ Gastroc  Full                                                        +---------+---------------+---------+-----------+----------+--------------+ GSV      Full           Yes      Yes                                  +---------+---------------+---------+-----------+----------+--------------+ SSV      Full           Yes      Yes                                 +---------+---------------+---------+-----------+----------+--------------+  +----+---------------+---------+-----------+----------+--------------+ LEFTCompressibilityPhasicitySpontaneityPropertiesThrombus Aging +----+---------------+---------+-----------+----------+--------------+ CFV Full           Yes      Yes                                 +----+---------------+---------+-----------+----------+--------------+  Summary: RIGHT: - No evidence of deep vein thrombosis in the lower extremity. No indirect evidence of obstruction proximal to the inguinal ligament. - No cystic structure found in the popliteal fossa.  LEFT: - No evidence of common femoral vein obstruction.  *See table(s) above for measurements and observations. Electronically signed by Jenkins Rouge MD on 01/07/2020 at 8:09:18 AM.    Final     Assessment & Plan:   Ninette was seen today for hypertension and annual exam.  Diagnoses and all orders for this visit:  Routine general medical examination at a health care facility- Exam completed, labs reviewed, vaccines reviewed and updated, screening for cervical cancer is up-to-date, she tells me she has an upcoming mammogram, she defers on colon cancer screening at this time, patient education was given. -     Lipid panel; Future -     Lipid panel  Seasonal allergic rhinitis due to pollen -     montelukast (SINGULAIR) 10 MG tablet; Take 1 tablet (10 mg total) by mouth at bedtime. -     methylPREDNISolone (MEDROL DOSEPAK) 4 MG TBPK tablet; TAKE AS DIRECTED  Eustachian tube dysfunction, left -  methylPREDNISolone (MEDROL DOSEPAK) 4 MG TBPK tablet; TAKE AS DIRECTED  Essential hypertension, benign- Her blood pressure is adequately well controlled. -     CBC with Differential/Platelet; Future -     Basic  metabolic panel; Future -     Basic metabolic panel -     CBC with Differential/Platelet  Graves disease- Her TSH is normal.  She will remain on the current dose of methimazole. -     TSH; Future -     TSH  OAB (overactive bladder)- Will treat this with Vesicare. -     Discontinue: mirabegron ER (MYRBETRIQ) 25 MG TB24 tablet; Take 1 tablet (25 mg total) by mouth daily. -     solifenacin (VESICARE) 10 MG tablet; Take 1 tablet (10 mg total) by mouth daily.  Prediabetes -     Hemoglobin A1c; Future -     Hemoglobin A1c -     Semaglutide,0.25 or 0.5MG/DOS, (OZEMPIC, 0.25 OR 0.5 MG/DOSE,) 2 MG/1.5ML SOPN; Inject 0.375 mLs (0.5 mg total) into the skin once a week. -     Insulin Pen Needle (NOVOFINE) 32G X 6 MM MISC; 1 Act by Does not apply route once a week.  Morbid obesity ([place])- She is prediabetic I recommended that she start using a GLP-1 agonist and phentermine to help her lose weight. -     phentermine 37.5 MG capsule; Take 1 capsule (37.5 mg total) by mouth every morning. -     Hepatic function panel; Future -     Hemoglobin A1c; Future -     Hemoglobin A1c -     Hepatic function panel -     Insulin Pen Needle (NOVOFINE) 32G X 6 MM MISC; 1 Act by Does not apply route once a week.   I have discontinued Tailer L. Esquivel's Pen Needles, Sprintec 28, albuterol, terconazole, ibuprofen, amoxicillin-clavulanate, Saxenda, tolterodine, and mirabegron ER. I am also having her start on montelukast, methylPREDNISolone, phentermine, Ozempic (0.25 or 0.5 MG/DOSE), NovoFine, and solifenacin. Additionally, I am having her maintain her fluticasone, omeprazole, multivitamin, levocetirizine, indapamide, nadolol, Viibryd, clobetasol ointment, diclofenac, methimazole, and norgestimate-ethinyl estradiol.  Meds ordered this encounter  Medications  . DISCONTD: mirabegron ER (MYRBETRIQ) 25 MG TB24 tablet    Sig: Take 1 tablet (25 mg total) by mouth daily.    Dispense:  90 tablet    Refill:  0  .  montelukast (SINGULAIR) 10 MG tablet    Sig: Take 1 tablet (10 mg total) by mouth at bedtime.    Dispense:  90 tablet    Refill:  1  . methylPREDNISolone (MEDROL DOSEPAK) 4 MG TBPK tablet    Sig: TAKE AS DIRECTED    Dispense:  21 tablet    Refill:  0  . phentermine 37.5 MG capsule    Sig: Take 1 capsule (37.5 mg total) by mouth every morning.    Dispense:  30 capsule    Refill:  2  . Semaglutide,0.25 or 0.5MG/DOS, (OZEMPIC, 0.25 OR 0.5 MG/DOSE,) 2 MG/1.5ML SOPN    Sig: Inject 0.375 mLs (0.5 mg total) into the skin once a week.    Dispense:  3 pen    Refill:  0  . Insulin Pen Needle (NOVOFINE) 32G X 6 MM MISC    Sig: 1 Act by Does not apply route once a week.    Dispense:  50 each    Refill:  0  . solifenacin (VESICARE) 10 MG tablet    Sig: Take 1 tablet (10 mg total)  by mouth daily.    Dispense:  90 tablet    Refill:  1   In addition to time spent on CPE, I spent 50 minutes in preparing to see the patient by review of recent labs, imaging and procedures, obtaining and reviewing separately obtained history, communicating with the patient and family or caregiver, ordering medications, tests or procedures, and documenting clinical information in the EHR including the differential Dx, treatment, and any further evaluation and other management of  1. Seasonal allergic rhinitis due to pollen 3. Eustachian tube dysfunction, left 4. Essential hypertension, benign 5. Graves disease 6. OAB (overactive bladder) 7. Prediabetes 8. Morbid obesity ([place])  Follow-up: Return in about 3 months (around 07/13/2020).  Scarlette Calico, MD

## 2020-04-12 NOTE — Patient Instructions (Signed)
Health Maintenance, Female Adopting a healthy lifestyle and getting preventive care are important in promoting health and wellness. Ask your health care provider about:  The right schedule for you to have regular tests and exams.  Things you can do on your own to prevent diseases and keep yourself healthy. What should I know about diet, weight, and exercise? Eat a healthy diet   Eat a diet that includes plenty of vegetables, fruits, low-fat dairy products, and lean protein.  Do not eat a lot of foods that are high in solid fats, added sugars, or sodium. Maintain a healthy weight Body mass index (BMI) is used to identify weight problems. It estimates body fat based on height and weight. Your health care provider can help determine your BMI and help you achieve or maintain a healthy weight. Get regular exercise Get regular exercise. This is one of the most important things you can do for your health. Most adults should:  Exercise for at least 150 minutes each week. The exercise should increase your heart rate and make you sweat (moderate-intensity exercise).  Do strengthening exercises at least twice a week. This is in addition to the moderate-intensity exercise.  Spend less time sitting. Even light physical activity can be beneficial. Watch cholesterol and blood lipids Have your blood tested for lipids and cholesterol at 45 years of age, then have this test every 5 years. Have your cholesterol levels checked more often if:  Your lipid or cholesterol levels are high.  You are older than 45 years of age.  You are at high risk for heart disease. What should I know about cancer screening? Depending on your health history and family history, you may need to have cancer screening at various ages. This may include screening for:  Breast cancer.  Cervical cancer.  Colorectal cancer.  Skin cancer.  Lung cancer. What should I know about heart disease, diabetes, and high blood  pressure? Blood pressure and heart disease  High blood pressure causes heart disease and increases the risk of stroke. This is more likely to develop in people who have high blood pressure readings, are of African descent, or are overweight.  Have your blood pressure checked: ? Every 3-5 years if you are 18-39 years of age. ? Every year if you are 40 years old or older. Diabetes Have regular diabetes screenings. This checks your fasting blood sugar level. Have the screening done:  Once every three years after age 40 if you are at a normal weight and have a low risk for diabetes.  More often and at a younger age if you are overweight or have a high risk for diabetes. What should I know about preventing infection? Hepatitis B If you have a higher risk for hepatitis B, you should be screened for this virus. Talk with your health care provider to find out if you are at risk for hepatitis B infection. Hepatitis C Testing is recommended for:  Everyone born from 1945 through 1965.  Anyone with known risk factors for hepatitis C. Sexually transmitted infections (STIs)  Get screened for STIs, including gonorrhea and chlamydia, if: ? You are sexually active and are younger than 45 years of age. ? You are older than 45 years of age and your health care provider tells you that you are at risk for this type of infection. ? Your sexual activity has changed since you were last screened, and you are at increased risk for chlamydia or gonorrhea. Ask your health care provider if   you are at risk.  Ask your health care provider about whether you are at high risk for HIV. Your health care provider may recommend a prescription medicine to help prevent HIV infection. If you choose to take medicine to prevent HIV, you should first get tested for HIV. You should then be tested every 3 months for as long as you are taking the medicine. Pregnancy  If you are about to stop having your period (premenopausal) and  you may become pregnant, seek counseling before you get pregnant.  Take 400 to 800 micrograms (mcg) of folic acid every day if you become pregnant.  Ask for birth control (contraception) if you want to prevent pregnancy. Osteoporosis and menopause Osteoporosis is a disease in which the bones lose minerals and strength with aging. This can result in bone fractures. If you are 65 years old or older, or if you are at risk for osteoporosis and fractures, ask your health care provider if you should:  Be screened for bone loss.  Take a calcium or vitamin D supplement to lower your risk of fractures.  Be given hormone replacement therapy (HRT) to treat symptoms of menopause. Follow these instructions at home: Lifestyle  Do not use any products that contain nicotine or tobacco, such as cigarettes, e-cigarettes, and chewing tobacco. If you need help quitting, ask your health care provider.  Do not use street drugs.  Do not share needles.  Ask your health care provider for help if you need support or information about quitting drugs. Alcohol use  Do not drink alcohol if: ? Your health care provider tells you not to drink. ? You are pregnant, may be pregnant, or are planning to become pregnant.  If you drink alcohol: ? Limit how much you use to 0-1 drink a day. ? Limit intake if you are breastfeeding.  Be aware of how much alcohol is in your drink. In the [place] one drink equals one 12 oz bottle of beer (355 mL), one 5 oz glass of wine (148 mL), or one 1 oz glass of hard liquor (44 mL). General instructions  Schedule regular health, dental, and eye exams.  Stay current with your vaccines.  Tell your health care provider if: ? You often feel depressed. ? You have ever been abused or do not feel safe at home. Summary  Adopting a healthy lifestyle and getting preventive care are important in promoting health and wellness.  Follow your health care provider's instructions about healthy  diet, exercising, and getting tested or screened for diseases.  Follow your health care provider's instructions on monitoring your cholesterol and blood pressure. This information is not intended to replace advice given to you by your health care provider. Make sure you discuss any questions you have with your health care provider. Document Revised: 09/17/2018 Document Reviewed: 09/17/2018 Elsevier Patient Education  2020 [employer]  

## 2020-04-14 ENCOUNTER — Telehealth: Payer: Self-pay

## 2020-04-14 NOTE — Telephone Encounter (Signed)
Key: Seth Bake

## 2020-04-27 ENCOUNTER — Other Ambulatory Visit: Payer: Self-pay | Admitting: Internal Medicine

## 2020-05-04 ENCOUNTER — Telehealth: Payer: Self-pay

## 2020-05-04 NOTE — Telephone Encounter (Signed)
-----   Message from [facility], [place] sent at 04/12/2020  9:32 AM EDT ----- Regarding: COVID Vac Call or message pt about COVID vaccine dates

## 2020-05-05 NOTE — Telephone Encounter (Signed)
IMM updated.

## 2020-05-07 ENCOUNTER — Other Ambulatory Visit: Payer: Self-pay | Admitting: Internal Medicine

## 2020-05-07 DIAGNOSIS — I10 Essential (primary) hypertension: Secondary | ICD-10-CM

## 2020-05-29 ENCOUNTER — Other Ambulatory Visit: Payer: Self-pay | Admitting: Internal Medicine

## 2020-05-29 DIAGNOSIS — I10 Essential (primary) hypertension: Secondary | ICD-10-CM

## 2020-05-30 ENCOUNTER — Other Ambulatory Visit: Payer: Self-pay | Admitting: Internal Medicine

## 2020-05-31 ENCOUNTER — Other Ambulatory Visit: Payer: Self-pay | Admitting: Internal Medicine

## 2020-06-05 ENCOUNTER — Other Ambulatory Visit: Payer: Self-pay | Admitting: Obstetrics

## 2020-06-05 DIAGNOSIS — Z3041 Encounter for surveillance of contraceptive pills: Secondary | ICD-10-CM

## 2020-06-11 ENCOUNTER — Other Ambulatory Visit: Payer: Self-pay | Admitting: Internal Medicine

## 2020-06-11 ENCOUNTER — Other Ambulatory Visit: Payer: Self-pay | Admitting: Obstetrics

## 2020-06-11 DIAGNOSIS — I10 Essential (primary) hypertension: Secondary | ICD-10-CM

## 2020-06-14 ENCOUNTER — Other Ambulatory Visit: Payer: Self-pay | Admitting: Internal Medicine

## 2020-06-14 DIAGNOSIS — R7303 Prediabetes: Secondary | ICD-10-CM

## 2020-06-14 MED ORDER — OZEMPIC (1 MG/DOSE) 4 MG/3ML ~~LOC~~ SOPN: 1.0000 mg | PEN_INJECTOR | SUBCUTANEOUS | 1 refills | Status: DC

## 2020-07-03 ENCOUNTER — Other Ambulatory Visit: Payer: Self-pay | Admitting: Internal Medicine

## 2020-07-04 ENCOUNTER — Other Ambulatory Visit: Payer: Self-pay | Admitting: Internal Medicine

## 2020-07-04 DIAGNOSIS — R7303 Prediabetes: Secondary | ICD-10-CM

## 2020-07-04 MED ORDER — OZEMPIC (1 MG/DOSE) 4 MG/3ML ~~LOC~~ SOPN: 1.0000 mg | PEN_INJECTOR | SUBCUTANEOUS | 1 refills | Status: DC

## 2020-07-09 ENCOUNTER — Other Ambulatory Visit: Payer: Self-pay | Admitting: Internal Medicine

## 2020-07-10 ENCOUNTER — Other Ambulatory Visit: Payer: Self-pay | Admitting: Internal Medicine

## 2020-07-10 MED ORDER — PHENTERMINE HCL 37.5 MG PO CAPS: 37.5000 mg | ORAL_CAPSULE | Freq: Every morning | ORAL | 2 refills | Status: DC

## 2020-07-11 ENCOUNTER — Other Ambulatory Visit: Payer: Self-pay | Admitting: Internal Medicine

## 2020-07-11 DIAGNOSIS — Z1231 Encounter for screening mammogram for malignant neoplasm of breast: Secondary | ICD-10-CM

## 2020-07-21 ENCOUNTER — Other Ambulatory Visit: Payer: Self-pay | Admitting: Internal Medicine

## 2020-07-21 ENCOUNTER — Other Ambulatory Visit: Payer: Self-pay | Admitting: Obstetrics

## 2020-07-21 DIAGNOSIS — R7303 Prediabetes: Secondary | ICD-10-CM

## 2020-07-21 MED ORDER — OZEMPIC (1 MG/DOSE) 4 MG/3ML ~~LOC~~ SOPN: 1.0000 mg | PEN_INJECTOR | SUBCUTANEOUS | 1 refills | Status: DC

## 2020-07-26 ENCOUNTER — Other Ambulatory Visit: Payer: Self-pay | Admitting: Obstetrics

## 2020-07-26 ENCOUNTER — Other Ambulatory Visit: Payer: Self-pay | Admitting: Internal Medicine

## 2020-07-27 ENCOUNTER — Other Ambulatory Visit: Payer: Self-pay | Admitting: Internal Medicine

## 2020-08-18 ENCOUNTER — Ambulatory Visit (INDEPENDENT_AMBULATORY_CARE_PROVIDER_SITE_OTHER): Payer: BC Managed Care – PPO | Admitting: Internal Medicine

## 2020-08-18 ENCOUNTER — Encounter: Payer: Self-pay | Admitting: Internal Medicine

## 2020-08-18 ENCOUNTER — Other Ambulatory Visit: Payer: Self-pay

## 2020-08-18 VITALS — BP 134/86 | HR 67 | Temp 98.4°F | Resp 16 | Ht 65.0 in | Wt 305.0 lb

## 2020-08-18 DIAGNOSIS — R7303 Prediabetes: Secondary | ICD-10-CM

## 2020-08-18 DIAGNOSIS — I10 Essential (primary) hypertension: Secondary | ICD-10-CM

## 2020-08-18 DIAGNOSIS — T502X5A Adverse effect of carbonic-anhydrase inhibitors, benzothiadiazides and other diuretics, initial encounter: Secondary | ICD-10-CM

## 2020-08-18 DIAGNOSIS — E876 Hypokalemia: Secondary | ICD-10-CM

## 2020-08-18 DIAGNOSIS — F5081 Binge eating disorder: Secondary | ICD-10-CM

## 2020-08-18 DIAGNOSIS — G43009 Migraine without aura, not intractable, without status migrainosus: Secondary | ICD-10-CM

## 2020-08-18 DIAGNOSIS — E0500 Thyrotoxicosis with diffuse goiter without thyrotoxic crisis or storm: Secondary | ICD-10-CM | POA: Diagnosis not present

## 2020-08-18 LAB — BASIC METABOLIC PANEL
BUN: 11 mg/dL (ref 6–23)
CO2: 31 mEq/L (ref 19–32)
Calcium: 9 mg/dL (ref 8.4–10.5)
Chloride: 98 mEq/L (ref 96–112)
Creatinine, Ser: 0.85 mg/dL (ref 0.40–1.20)
GFR: 82.69 mL/min (ref >60.00)
Glucose, Bld: 71 mg/dL (ref 70–99)
Potassium: 3.2 mEq/L — ABNORMAL LOW (ref 3.5–5.1)
Sodium: 136 mEq/L (ref 135–145)

## 2020-08-18 LAB — HEMOGLOBIN A1C: Hgb A1c MFr Bld: 6 % (ref 4.6–6.5)

## 2020-08-18 LAB — TSH: TSH: 3.77 u[IU]/mL (ref 0.35–4.50)

## 2020-08-18 MED ORDER — LISDEXAMFETAMINE DIMESYLATE 20 MG PO CAPS: 20.0000 mg | ORAL_CAPSULE | Freq: Every day | ORAL | 0 refills | Status: DC

## 2020-08-18 MED ORDER — NURTEC 75 MG PO TBDP: 1.0000 | ORAL_TABLET | Freq: Every day | ORAL | 0 refills | Status: DC | PRN

## 2020-08-18 NOTE — Progress Notes (Signed)
Subjective:  Patient ID: Alicia Hoover, female    DOB: 1976-09-15  Age: 44 y.o. MRN: [id number]  CC: Hypertension  This visit occurred during the SARS-CoV-2 public health emergency.  Safety protocols were in place, including screening questions prior to the visit, additional usage of staff PPE, and extensive cleaning of exam room while observing appropriate contact time as indicated for disinfecting solutions.    HPI Alicia Hoover presents for f/up -   She complains that she has not lost much weight with the combination of phentermine and Ozempic.  She now tells me that there is a component of binge eating.  She walks regularly and does not experience chest pain, shortness of breath, or palpitations.  She complains of intermittent fatigue, constipation, and sleeping too much.  She has intermittent migraine headaches with photophobia, phonophobia, and nausea.  She has had no recent episodes of vomiting, paresthesias, or changes in her vision or hearing.  Outpatient Medications Prior to Visit  Medication Sig Dispense Refill  . clobetasol ointment (TEMOVATE) 0.05 % APPLY TO AFFECTED AREA TWICE A DAY 60 g 1  . diclofenac (VOLTAREN) 75 MG EC tablet TAKE 1 TABLET BY MOUTH TWICE A DAY 60 tablet 2  . fluticasone (FLONASE) 50 MCG/ACT nasal spray Place 2 sprays into the nose daily as needed for allergies.     . indapamide (LOZOL) 1.25 MG tablet TAKE 1 TABLET BY MOUTH EVERY DAY 90 tablet 0  . Insulin Pen Needle (NOVOFINE) 32G X 6 MM MISC 1 Act by Does not apply route once a week. 50 each 0  . levocetirizine (XYZAL) 5 MG tablet TAKE 1 TABLET BY MOUTH EVERY DAY IN THE EVENING 90 tablet 1  . methimazole (TAPAZOLE) 10 MG tablet Take 1 tablet (10 mg total) by mouth 2 (two) times daily. Must be seen for refills. 60 tablet 0  . montelukast (SINGULAIR) 10 MG tablet Take 1 tablet (10 mg total) by mouth at bedtime. 90 tablet 1  . Multiple Vitamin (MULTIVITAMIN) tablet Take 1 tablet by mouth daily.    .  nadolol (CORGARD) 20 MG tablet Take 1 tablet (20 mg total) by mouth daily. 90 tablet 3  . omeprazole (PRILOSEC) 40 MG capsule Take 40 mg by mouth daily.    . Semaglutide, 1 MG/DOSE, (OZEMPIC, 1 MG/DOSE,) 4 MG/3ML SOPN Inject 1 mg into the skin once a week. 9 mL 1  . solifenacin (VESICARE) 10 MG tablet Take 1 tablet (10 mg total) by mouth daily. 90 tablet 1  . tinidazole (TINDAMAX) 500 MG tablet TAKE 2 TABLETS BY MOUTH EVERY DAY WITH BREAKFAST 10 tablet 2  . ibuprofen (ADVIL) 800 MG tablet TAKE 1 TABLET BY MOUTH EVERY 8 HOURS AS NEEDED 30 tablet 5  . phentermine 37.5 MG capsule Take 1 capsule (37.5 mg total) by mouth every morning. 30 capsule 2  . SPRINTEC 28 0.25-35 MG-MCG tablet TAKE 1 TABLET BY MOUTH EVERY DAY 84 tablet 5  . Vilazodone HCl (VIIBRYD) 20 MG TABS Take 1 tablet (20 mg total) by mouth daily. 90 tablet 1   No facility-administered medications prior to visit.    ROS Review of Systems  Constitutional: Positive for fatigue. Negative for appetite change, chills, diaphoresis, fever and unexpected weight change.  HENT: Negative.   Eyes: Negative for visual disturbance.  Respiratory: Negative for cough, chest tightness, shortness of breath and wheezing.   Cardiovascular: Negative for chest pain, palpitations and leg swelling.  Gastrointestinal: Negative for abdominal pain, constipation, diarrhea and nausea.  Endocrine: Negative.   Genitourinary: Negative.  Negative for difficulty urinating, dysuria and urgency.  Musculoskeletal: Negative for arthralgias and myalgias.  Skin: Negative.  Negative for color change, pallor and rash.  Neurological: Positive for headaches. Negative for dizziness, weakness, light-headedness and numbness.  Hematological: Negative for adenopathy. Does not bruise/bleed easily.  Psychiatric/Behavioral: Negative.  Negative for behavioral problems, dysphoric mood and sleep disturbance. The patient is not nervous/anxious.     Objective:  BP 134/86   Pulse 67    Temp 98.4 F (36.9 C) (Oral)   Resp 16   Ht 5' 5"  (1.651 m)   Wt (!) 305 lb (138.3 kg)   LMP 08/03/2020   SpO2 98%   BMI 50.75 kg/m   BP Readings from Last 3 Encounters:  08/18/20 134/86  04/12/20 128/80  12/22/19 118/82    Wt Readings from Last 3 Encounters:  08/18/20 (!) 305 lb (138.3 kg)  04/12/20 (!) 323 lb 4 oz (146.6 kg)  12/22/19 (!) 308 lb 8 oz (139.9 kg)    Physical Exam Vitals reviewed.  Constitutional:      Appearance: She is obese.  HENT:     Nose: Nose normal.     Mouth/Throat:     Mouth: Mucous membranes are moist.  Eyes:     General: No scleral icterus.    Conjunctiva/sclera: Conjunctivae normal.  Cardiovascular:     Rate and Rhythm: Normal rate and regular rhythm.     Heart sounds: No murmur heard.   Pulmonary:     Effort: Pulmonary effort is normal.     Breath sounds: No stridor. No wheezing, rhonchi or rales.  Abdominal:     General: Abdomen is protuberant. Bowel sounds are normal. There is no distension.     Palpations: Abdomen is soft. There is no hepatomegaly or mass.     Tenderness: There is no abdominal tenderness.  Musculoskeletal:        General: Normal range of motion.     Cervical back: Neck supple.     Right lower leg: No edema.     Left lower leg: No edema.  Lymphadenopathy:     Cervical: No cervical adenopathy.  Skin:    General: Skin is warm and dry.  Neurological:     General: No focal deficit present.     Mental Status: She is alert and oriented to person, place, and time. Mental status is at baseline.  Psychiatric:        Mood and Affect: Mood normal.        Behavior: Behavior normal.     Lab Results  Component Value Date   WBC 8.3 04/12/2020   HGB 13.1 04/12/2020   HCT 39.8 04/12/2020   PLT 356.0 04/12/2020   GLUCOSE 71 08/18/2020   CHOL 148 04/12/2020   TRIG 147.0 04/12/2020   HDL 39.90 04/12/2020   LDLDIRECT 57.0 03/27/2017   LDLCALC 79 04/12/2020   ALT 19 04/12/2020   AST 17 04/12/2020   NA 136  08/18/2020   K 3.2 (L) 08/18/2020   CL 98 08/18/2020   CREATININE 0.85 08/18/2020   BUN 11 08/18/2020   CO2 31 08/18/2020   TSH 3.77 08/18/2020   HGBA1C 6.0 08/18/2020    VAS [place] LOWER EXTREMITY VENOUS (DVT)  Result Date: 01/07/2020  Lower Venous DVTStudy Indications: Pain. Other Indications: Patient complains of right calf pain for "months". She denies  any shortness of breath. Performing Technologist: Wilkie Aye RVT  Examination Guidelines: A complete evaluation includes B-mode imaging, spectral Doppler, color Doppler, and power Doppler as needed of all accessible portions of each vessel. Bilateral testing is considered an integral part of a complete examination. Limited examinations for reoccurring indications may be performed as noted. The reflux portion of the exam is performed with the patient in reverse Trendelenburg.  +---------+---------------+---------+-----------+----------+--------------+ RIGHT    CompressibilityPhasicitySpontaneityPropertiesThrombus Aging +---------+---------------+---------+-----------+----------+--------------+ CFV      Full           Yes      Yes                                 +---------+---------------+---------+-----------+----------+--------------+ SFJ      Full           Yes      Yes                                 +---------+---------------+---------+-----------+----------+--------------+ FV Prox  Full           Yes      Yes                                 +---------+---------------+---------+-----------+----------+--------------+ FV Mid   Full           Yes      Yes                                 +---------+---------------+---------+-----------+----------+--------------+ FV DistalFull           Yes      Yes                                 +---------+---------------+---------+-----------+----------+--------------+ PFV      Full                                                         +---------+---------------+---------+-----------+----------+--------------+ POP      Full           Yes      Yes                                 +---------+---------------+---------+-----------+----------+--------------+ PTV      Full           Yes      Yes                                 +---------+---------------+---------+-----------+----------+--------------+ PERO     Full           Yes      Yes                                 +---------+---------------+---------+-----------+----------+--------------+ Gastroc  Full                                                        +---------+---------------+---------+-----------+----------+--------------+  GSV      Full           Yes      Yes                                 +---------+---------------+---------+-----------+----------+--------------+ SSV      Full           Yes      Yes                                 +---------+---------------+---------+-----------+----------+--------------+  +----+---------------+---------+-----------+----------+--------------+ LEFTCompressibilityPhasicitySpontaneityPropertiesThrombus Aging +----+---------------+---------+-----------+----------+--------------+ CFV Full           Yes      Yes                                 +----+---------------+---------+-----------+----------+--------------+  Summary: RIGHT: - No evidence of deep vein thrombosis in the lower extremity. No indirect evidence of obstruction proximal to the inguinal ligament. - No cystic structure found in the popliteal fossa.  LEFT: - No evidence of common femoral vein obstruction.  *See table(s) above for measurements and observations. Electronically signed by Jenkins Rouge MD on 01/07/2020 at 8:09:18 AM.    Final     Assessment & Plan:   Alicia Hoover was seen today for hypertension.  Diagnoses and all orders for this visit:  Essential hypertension, benign- She has not quite achieved her blood pressure goal of 130/80.   Will continue the current dose of indapamide.  She agrees to improve her lifestyle modifications.  She has developed hypokalemia from the thiazide diuretic so I recommended that she start taking a potassium supplement. -     Basic metabolic panel; Future -     Basic metabolic panel -     Cancel: EKG 12-Lead -     potassium chloride SA (KLOR-CON) 20 MEQ tablet; Take 1 tablet (20 mEq total) by mouth daily.  Prediabetes- Her A1c is at 6.0%.  She will improve her lifestyle modifications. -     Basic metabolic panel; Future -     Hemoglobin A1c; Future -     Hemoglobin A1c -     Basic metabolic panel -     Cancel: POCT glycosylated hemoglobin (Hb A1C)  Graves disease- She is euthyroid.  Her TSH is normal.  She will stay on the current dose of methimazole. -     TSH; Future -     TSH  Binge-eating disorder, moderate- I recommended that she try lisdexamfetamine for this. -     lisdexamfetamine (VYVANSE) 20 MG capsule; Take 1 capsule (20 mg total) by mouth daily.  Migraine without aura and without status migrainosus, not intractable- I recommended she treat this with the CGRP antagonist. -     Rimegepant Sulfate (NURTEC) 75 MG TBDP; Take 1 tablet by mouth daily as needed.  Diuretic-induced hypokalemia -     potassium chloride SA (KLOR-CON) 20 MEQ tablet; Take 1 tablet (20 mEq total) by mouth daily.   I have discontinued [employer] For[employer]'s Viibryd, Sprintec 28, phentermine, and ibuprofen. I am also having her start on lisdexamfetamine, Nurtec, and potassium chloride SA. Additionally, I am having her maintain her fluticasone, omeprazole, multivitamin, levocetirizine, nadolol, clobetasol ointment, tinidazole, montelukast, NovoFine, solifenacin, diclofenac, methimazole, indapamide, and Ozempic (1 MG/DOSE).  Meds ordered this encounter  Medications  .  lisdexamfetamine (VYVANSE) 20 MG capsule    Sig: Take 1 capsule (20 mg total) by mouth daily.    Dispense:  30 capsule    Refill:  0  .  Rimegepant Sulfate (NURTEC) 75 MG TBDP    Sig: Take 1 tablet by mouth daily as needed.    Dispense:  8 tablet    Refill:  0  . potassium chloride SA (KLOR-CON) 20 MEQ tablet    Sig: Take 1 tablet (20 mEq total) by mouth daily.    Dispense:  180 tablet    Refill:  0   I spent 50 minutes in preparing to see the patient by review of recent labs, imaging and procedures, obtaining and reviewing separately obtained history, communicating with the patient and family or caregiver, ordering medications, tests or procedures, and documenting clinical information in the EHR including the differential Dx, treatment, and any further evaluation and other management of 1. Essential hypertension, benign 2. Prediabetes 3. Graves disease 4. Binge-eating disorder, moderate 5. Migraine without aura and without status migrainosus, not intractable 6. Diuretic-induced hypokalemia     Follow-up: Return in about 6 months (around 02/15/2021).  Scarlette Calico, MD

## 2020-08-18 NOTE — Patient Instructions (Signed)

## 2020-08-19 DIAGNOSIS — T502X5A Adverse effect of carbonic-anhydrase inhibitors, benzothiadiazides and other diuretics, initial encounter: Secondary | ICD-10-CM | POA: Insufficient documentation

## 2020-08-19 MED ORDER — POTASSIUM CHLORIDE CRYS ER 20 MEQ PO TBCR: 20.0000 meq | EXTENDED_RELEASE_TABLET | Freq: Every day | ORAL | 0 refills | Status: DC

## 2020-08-19 NOTE — Telephone Encounter (Signed)
Key: [id number]

## 2020-08-23 NOTE — Telephone Encounter (Signed)
Vyvanse has been approved 08/19/20-08/19/21.  Determination sent to scan.

## 2020-08-30 ENCOUNTER — Other Ambulatory Visit: Payer: Self-pay

## 2020-08-30 ENCOUNTER — Encounter: Payer: Self-pay | Admitting: Internal Medicine

## 2020-08-30 ENCOUNTER — Ambulatory Visit (INDEPENDENT_AMBULATORY_CARE_PROVIDER_SITE_OTHER): Payer: BC Managed Care – PPO | Admitting: Internal Medicine

## 2020-08-30 VITALS — BP 144/94 | HR 84 | Temp 98.6°F | Resp 16 | Ht 65.0 in | Wt 305.0 lb

## 2020-08-30 DIAGNOSIS — J019 Acute sinusitis, unspecified: Secondary | ICD-10-CM

## 2020-08-30 DIAGNOSIS — J301 Allergic rhinitis due to pollen: Secondary | ICD-10-CM

## 2020-08-30 MED ORDER — AMOXICILLIN-POT CLAVULANATE 875-125 MG PO TABS: 1.0000 | ORAL_TABLET | Freq: Two times a day (BID) | ORAL | 0 refills | Status: AC

## 2020-08-30 MED ORDER — METHYLPREDNISOLONE ACETATE 40 MG/ML IJ SUSP
40.0000 mg | Freq: Once | INTRAMUSCULAR | Status: AC
  Administered 2020-08-30: 40 mg via INTRAMUSCULAR

## 2020-08-30 MED ORDER — METHYLPREDNISOLONE ACETATE 80 MG/ML IJ SUSP
80.0000 mg | Freq: Once | INTRAMUSCULAR | Status: AC
  Administered 2020-08-30: 80 mg via INTRAMUSCULAR

## 2020-08-30 NOTE — Progress Notes (Signed)
PCP ordered 166m of depo-medrol to be administered to the pt.

## 2020-08-30 NOTE — Patient Instructions (Signed)
Sinusitis, Adult Sinusitis is inflammation of your sinuses. Sinuses are hollow spaces in the bones around your face. Your sinuses are located:  Around your eyes.  In the middle of your forehead.  Behind your nose.  In your cheekbones. Mucus normally drains out of your sinuses. When your nasal tissues become inflamed or swollen, mucus can become trapped or blocked. This allows bacteria, viruses, and fungi to grow, which leads to infection. Most infections of the sinuses are caused by a virus. Sinusitis can develop quickly. It can last for up to 4 weeks (acute) or for more than 12 weeks (chronic). Sinusitis often develops after a cold. What are the causes? This condition is caused by anything that creates swelling in the sinuses or stops mucus from draining. This includes:  Allergies.  Asthma.  Infection from bacteria or viruses.  Deformities or blockages in your nose or sinuses.  Abnormal growths in the nose (nasal polyps).  Pollutants, such as chemicals or irritants in the air.  Infection from fungi (rare). What increases the risk? You are more likely to develop this condition if you:  Have a weak body defense system (immune system).  Do a lot of swimming or diving.  Overuse nasal sprays.  Smoke. What are the signs or symptoms? The main symptoms of this condition are pain and a feeling of pressure around the affected sinuses. Other symptoms include:  Stuffy nose or congestion.  Thick drainage from your nose.  Swelling and warmth over the affected sinuses.  Headache.  Upper toothache.  A cough that may get worse at night.  Extra mucus that collects in the throat or the back of the nose (postnasal drip).  Decreased sense of smell and taste.  Fatigue.  A fever.  Sore throat.  Bad breath. How is this diagnosed? This condition is diagnosed based on:  Your symptoms.  Your medical history.  A physical exam.  Tests to find out if your condition is  acute or chronic. This may include: ? Checking your nose for nasal polyps. ? Viewing your sinuses using a device that has a light (endoscope). ? Testing for allergies or bacteria. ? Imaging tests, such as an MRI or CT scan. In rare cases, a bone biopsy may be done to rule out more serious types of fungal sinus disease. How is this treated? Treatment for sinusitis depends on the cause and whether your condition is chronic or acute.  If caused by a virus, your symptoms should go away on their own within 10 days. You may be given medicines to relieve symptoms. They include: ? Medicines that shrink swollen nasal passages (topical intranasal decongestants). ? Medicines that treat allergies (antihistamines). ? A spray that eases inflammation of the nostrils (topical intranasal corticosteroids). ? Rinses that help get rid of thick mucus in your nose (nasal saline washes).  If caused by bacteria, your health care provider may recommend waiting to see if your symptoms improve. Most bacterial infections will get better without antibiotic medicine. You may be given antibiotics if you have: ? A severe infection. ? A weak immune system.  If caused by narrow nasal passages or nasal polyps, you may need to have surgery. Follow these instructions at home: Medicines  Take, use, or apply over-the-counter and prescription medicines only as told by your health care provider. These may include nasal sprays.  If you were prescribed an antibiotic medicine, take it as told by your health care provider. Do not stop taking the antibiotic even if you start  to feel better. Hydrate and humidify   Drink enough fluid to keep your urine pale yellow. Staying hydrated will help to thin your mucus.  Use a cool mist humidifier to keep the humidity level in your home above 50%.  Inhale steam for 10-15 minutes, 3-4 times a day, or as told by your health care provider. You can do this in the bathroom while a hot shower is  running.  Limit your exposure to cool or dry air. Rest  Rest as much as possible.  Sleep with your head raised (elevated).  Make sure you get enough sleep each night. General instructions   Apply a warm, moist washcloth to your face 3-4 times a day or as told by your health care provider. This will help with discomfort.  Wash your hands often with soap and water to reduce your exposure to germs. If soap and water are not available, use hand sanitizer.  Do not smoke. Avoid being around people who are smoking (secondhand smoke).  Keep all follow-up visits as told by your health care provider. This is important. Contact a health care provider if:  You have a fever.  Your symptoms get worse.  Your symptoms do not improve within 10 days. Get help right away if:  You have a severe headache.  You have persistent vomiting.  You have severe pain or swelling around your face or eyes.  You have vision problems.  You develop confusion.  Your neck is stiff.  You have trouble breathing. Summary  Sinusitis is soreness and inflammation of your sinuses. Sinuses are hollow spaces in the bones around your face.  This condition is caused by nasal tissues that become inflamed or swollen. The swelling traps or blocks the flow of mucus. This allows bacteria, viruses, and fungi to grow, which leads to infection.  If you were prescribed an antibiotic medicine, take it as told by your health care provider. Do not stop taking the antibiotic even if you start to feel better.  Keep all follow-up visits as told by your health care provider. This is important. This information is not intended to replace advice given to you by your health care provider. Make sure you discuss any questions you have with your health care provider. Document Revised: 02/24/2018 Document Reviewed: 02/24/2018 Elsevier Patient Education  [place].

## 2020-08-30 NOTE — Progress Notes (Signed)
Subjective:  Patient ID: Alicia Hoover, female    DOB: 02/22/1976  Age: 44 y.o. MRN: [id number]  CC: URI  This visit occurred during the SARS-CoV-2 public health emergency.  Safety protocols were in place, including screening questions prior to the visit, additional usage of staff PPE, and extensive cleaning of exam room while observing appropriate contact time as indicated for disinfecting solutions.    HPI IDALIE CANTO presents for f/up - She complains of a 2 day hx of ST, facial pain, runny nose, and NP cough. She has not gotten much sx relief with Amoxil.  Outpatient Medications Prior to Visit  Medication Sig Dispense Refill  . clobetasol ointment (TEMOVATE) 0.05 % APPLY TO AFFECTED AREA TWICE A DAY 60 g 1  . diclofenac (VOLTAREN) 75 MG EC tablet TAKE 1 TABLET BY MOUTH TWICE A DAY 60 tablet 2  . fluticasone (FLONASE) 50 MCG/ACT nasal spray Place 2 sprays into the nose daily as needed for allergies.     . indapamide (LOZOL) 1.25 MG tablet TAKE 1 TABLET BY MOUTH EVERY DAY 90 tablet 0  . Insulin Pen Needle (NOVOFINE) 32G X 6 MM MISC 1 Act by Does not apply route once a week. 50 each 0  . levocetirizine (XYZAL) 5 MG tablet TAKE 1 TABLET BY MOUTH EVERY DAY IN THE EVENING 90 tablet 1  . lisdexamfetamine (VYVANSE) 20 MG capsule Take 1 capsule (20 mg total) by mouth daily. 30 capsule 0  . methimazole (TAPAZOLE) 10 MG tablet Take 1 tablet (10 mg total) by mouth 2 (two) times daily. Must be seen for refills. 60 tablet 0  . montelukast (SINGULAIR) 10 MG tablet Take 1 tablet (10 mg total) by mouth at bedtime. 90 tablet 1  . Multiple Vitamin (MULTIVITAMIN) tablet Take 1 tablet by mouth daily.    . nadolol (CORGARD) 20 MG tablet Take 1 tablet (20 mg total) by mouth daily. 90 tablet 3  . omeprazole (PRILOSEC) 40 MG capsule Take 40 mg by mouth daily.    . potassium chloride SA (KLOR-CON) 20 MEQ tablet Take 1 tablet (20 mEq total) by mouth daily. 180 tablet 0  . Rimegepant Sulfate (NURTEC) 75  MG TBDP Take 1 tablet by mouth daily as needed. 8 tablet 0  . Semaglutide, 1 MG/DOSE, (OZEMPIC, 1 MG/DOSE,) 4 MG/3ML SOPN Inject 1 mg into the skin once a week. 9 mL 1  . solifenacin (VESICARE) 10 MG tablet Take 1 tablet (10 mg total) by mouth daily. 90 tablet 1  . tinidazole (TINDAMAX) 500 MG tablet TAKE 2 TABLETS BY MOUTH EVERY DAY WITH BREAKFAST 10 tablet 2   No facility-administered medications prior to visit.    ROS Review of Systems  Constitutional: Negative for chills, diaphoresis, fatigue and fever.  HENT: Positive for congestion, postnasal drip, rhinorrhea, sinus pressure, sinus pain and sore throat. Negative for facial swelling, nosebleeds, trouble swallowing and voice change.   Eyes: Negative.   Respiratory: Positive for cough. Negative for chest tightness and shortness of breath.   Cardiovascular: Negative.   Gastrointestinal: Negative.  Negative for abdominal pain, diarrhea and nausea.  Genitourinary: Negative.  Negative for difficulty urinating.  Musculoskeletal: Negative.  Negative for arthralgias and myalgias.  Skin: Negative.  Negative for color change and rash.  Neurological: Negative.  Negative for dizziness, weakness and headaches.  Hematological: Negative for adenopathy. Does not bruise/bleed easily.  Psychiatric/Behavioral: Negative.     Objective:  BP (!) 144/94   Pulse 84   Temp 98.6 F (37  C) (Oral)   Resp 16   Ht 5' 5"  (1.651 m)   Wt (!) 305 lb (138.3 kg)   LMP 08/03/2020   SpO2 96%   BMI 50.75 kg/m   BP Readings from Last 3 Encounters:  08/30/20 (!) 144/94  08/18/20 134/86  04/12/20 128/80    Wt Readings from Last 3 Encounters:  08/30/20 (!) 305 lb (138.3 kg)  08/18/20 (!) 305 lb (138.3 kg)  04/12/20 (!) 323 lb 4 oz (146.6 kg)    Physical Exam Vitals reviewed.  Constitutional:      General: She is not in acute distress.    Appearance: Normal appearance. She is obese. She is not ill-appearing, toxic-appearing or diaphoretic.  HENT:      Right Ear: Hearing, tympanic membrane, ear canal and external ear normal.     Left Ear: Hearing, tympanic membrane, ear canal and external ear normal.     Nose: Mucosal edema, congestion and rhinorrhea present. Rhinorrhea is purulent.     Right Nostril: No epistaxis.     Left Nostril: No epistaxis.     Right Turbinates: Not swollen or pale.     Left Turbinates: Not swollen or pale.     Right Sinus: No maxillary sinus tenderness or frontal sinus tenderness.     Left Sinus: No maxillary sinus tenderness or frontal sinus tenderness.     Mouth/Throat:     Mouth: Mucous membranes are moist. No angioedema.     Palate: No mass.     Pharynx: Pharyngeal swelling and posterior oropharyngeal erythema present. No oropharyngeal exudate or uvula swelling.     Tonsils: No tonsillar exudate or tonsillar abscesses.  Eyes:     General: No scleral icterus.    Conjunctiva/sclera: Conjunctivae normal.  Cardiovascular:     Rate and Rhythm: Normal rate and regular rhythm.     Heart sounds: No murmur heard.   Pulmonary:     Effort: Pulmonary effort is normal.     Breath sounds: No stridor. No wheezing, rhonchi or rales.  Abdominal:     General: Abdomen is protuberant. Bowel sounds are normal. There is no distension.     Palpations: Abdomen is soft. There is no hepatomegaly, splenomegaly or mass.     Tenderness: There is no abdominal tenderness.  Musculoskeletal:        General: Normal range of motion.     Cervical back: Neck supple.  Lymphadenopathy:     Cervical: No cervical adenopathy.  Skin:    General: Skin is warm and dry.  Neurological:     General: No focal deficit present.     Mental Status: She is alert.  Psychiatric:        Mood and Affect: Mood normal.        Behavior: Behavior normal.     Lab Results  Component Value Date   WBC 8.3 04/12/2020   HGB 13.1 04/12/2020   HCT 39.8 04/12/2020   PLT 356.0 04/12/2020   GLUCOSE 71 08/18/2020   CHOL 148 04/12/2020   TRIG 147.0 04/12/2020    HDL 39.90 04/12/2020   LDLDIRECT 57.0 03/27/2017   LDLCALC 79 04/12/2020   ALT 19 04/12/2020   AST 17 04/12/2020   NA 136 08/18/2020   K 3.2 (L) 08/18/2020   CL 98 08/18/2020   CREATININE 0.85 08/18/2020   BUN 11 08/18/2020   CO2 31 08/18/2020   TSH 3.77 08/18/2020   HGBA1C 6.0 08/18/2020    VAS [place] LOWER EXTREMITY VENOUS (DVT)  Result Date: 01/07/2020  Lower Venous DVTStudy Indications: Pain. Other Indications: Patient complains of right calf pain for "months". She denies                    any shortness of breath. Performing Technologist: Wilkie Aye RVT  Examination Guidelines: A complete evaluation includes B-mode imaging, spectral Doppler, color Doppler, and power Doppler as needed of all accessible portions of each vessel. Bilateral testing is considered an integral part of a complete examination. Limited examinations for reoccurring indications may be performed as noted. The reflux portion of the exam is performed with the patient in reverse Trendelenburg.  +---------+---------------+---------+-----------+----------+--------------+ RIGHT    CompressibilityPhasicitySpontaneityPropertiesThrombus Aging +---------+---------------+---------+-----------+----------+--------------+ CFV      Full           Yes      Yes                                 +---------+---------------+---------+-----------+----------+--------------+ SFJ      Full           Yes      Yes                                 +---------+---------------+---------+-----------+----------+--------------+ FV Prox  Full           Yes      Yes                                 +---------+---------------+---------+-----------+----------+--------------+ FV Mid   Full           Yes      Yes                                 +---------+---------------+---------+-----------+----------+--------------+ FV DistalFull           Yes      Yes                                  +---------+---------------+---------+-----------+----------+--------------+ PFV      Full                                                        +---------+---------------+---------+-----------+----------+--------------+ POP      Full           Yes      Yes                                 +---------+---------------+---------+-----------+----------+--------------+ PTV      Full           Yes      Yes                                 +---------+---------------+---------+-----------+----------+--------------+ PERO     Full           Yes      Yes                                 +---------+---------------+---------+-----------+----------+--------------+  Gastroc  Full                                                        +---------+---------------+---------+-----------+----------+--------------+ GSV      Full           Yes      Yes                                 +---------+---------------+---------+-----------+----------+--------------+ SSV      Full           Yes      Yes                                 +---------+---------------+---------+-----------+----------+--------------+  +----+---------------+---------+-----------+----------+--------------+ LEFTCompressibilityPhasicitySpontaneityPropertiesThrombus Aging +----+---------------+---------+-----------+----------+--------------+ CFV Full           Yes      Yes                                 +----+---------------+---------+-----------+----------+--------------+  Summary: RIGHT: - No evidence of deep vein thrombosis in the lower extremity. No indirect evidence of obstruction proximal to the inguinal ligament. - No cystic structure found in the popliteal fossa.  LEFT: - No evidence of common femoral vein obstruction.  *See table(s) above for measurements and observations. Electronically signed by Jenkins Rouge MD on 01/07/2020 at 8:09:18 AM.    Final     Assessment & Plan:   Jalia was seen today for  uri.  Diagnoses and all orders for this visit:  Seasonal allergic rhinitis due to pollen -     methylPREDNISolone acetate (DEPO-MEDROL) injection 80 mg -     methylPREDNISolone acetate (DEPO-MEDROL) injection 40 mg  Acute sinusitis, recurrence not specified, unspecified location -     amoxicillin-clavulanate (AUGMENTIN) 875-125 MG tablet; Take 1 tablet by mouth 2 (two) times daily for 10 days. -     methylPREDNISolone acetate (DEPO-MEDROL) injection 80 mg -     methylPREDNISolone acetate (DEPO-MEDROL) injection 40 mg   I am having Alexandria L. Hsu start on amoxicillin-clavulanate. I am also having her maintain her fluticasone, omeprazole, multivitamin, levocetirizine, nadolol, clobetasol ointment, tinidazole, montelukast, NovoFine, solifenacin, diclofenac, methimazole, indapamide, Ozempic (1 MG/DOSE), lisdexamfetamine, Nurtec, and potassium chloride SA. We administered methylPREDNISolone acetate and methylPREDNISolone acetate.  Meds ordered this encounter  Medications  . amoxicillin-clavulanate (AUGMENTIN) 875-125 MG tablet    Sig: Take 1 tablet by mouth 2 (two) times daily for 10 days.    Dispense:  20 tablet    Refill:  0  . methylPREDNISolone acetate (DEPO-MEDROL) injection 80 mg  . methylPREDNISolone acetate (DEPO-MEDROL) injection 40 mg     Follow-up: Return if symptoms worsen or fail to improve.  Scarlette Calico, MD

## 2020-08-31 ENCOUNTER — Encounter: Payer: Self-pay | Admitting: Obstetrics

## 2020-08-31 ENCOUNTER — Ambulatory Visit (INDEPENDENT_AMBULATORY_CARE_PROVIDER_SITE_OTHER): Payer: BC Managed Care – PPO | Admitting: Obstetrics

## 2020-08-31 ENCOUNTER — Other Ambulatory Visit (HOSPITAL_COMMUNITY)
Admission: RE | Admit: 2020-08-31 | Discharge: 2020-08-31 | Disposition: A | Discharge status: Home / Self Care | Payer: BC Managed Care – PPO | Source: Ambulatory Visit | Attending: Obstetrics | Admitting: Obstetrics

## 2020-08-31 VITALS — BP 137/95 | HR 81 | Ht 65.0 in | Wt 305.5 lb

## 2020-08-31 DIAGNOSIS — R21 Rash and other nonspecific skin eruption: Secondary | ICD-10-CM

## 2020-08-31 DIAGNOSIS — N898 Other specified noninflammatory disorders of vagina: Secondary | ICD-10-CM

## 2020-08-31 DIAGNOSIS — N393 Stress incontinence (female) (male): Secondary | ICD-10-CM

## 2020-08-31 DIAGNOSIS — Z01419 Encounter for gynecological examination (general) (routine) without abnormal findings: Secondary | ICD-10-CM | POA: Insufficient documentation

## 2020-08-31 DIAGNOSIS — Z3041 Encounter for surveillance of contraceptive pills: Secondary | ICD-10-CM

## 2020-08-31 DIAGNOSIS — B3731 Acute candidiasis of vulva and vagina: Secondary | ICD-10-CM

## 2020-08-31 DIAGNOSIS — Z6841 Body Mass Index (BMI) 40.0 and over, adult: Secondary | ICD-10-CM

## 2020-08-31 DIAGNOSIS — B373 Candidiasis of vulva and vagina: Secondary | ICD-10-CM | POA: Diagnosis not present

## 2020-08-31 LAB — HM PAP SMEAR

## 2020-08-31 MED ORDER — FLUCONAZOLE 200 MG PO TABS: 200.0000 mg | ORAL_TABLET | ORAL | 2 refills | Status: DC

## 2020-08-31 MED ORDER — CLOTRIMAZOLE-BETAMETHASONE 1-0.05 % EX CREA: 1.0000 "application " | TOPICAL_CREAM | Freq: Two times a day (BID) | CUTANEOUS | 0 refills | Status: DC

## 2020-08-31 NOTE — Progress Notes (Signed)
Subjective:        Alicia Hoover is a 44 y.o. female here for a routine exam.  Current complaints:  Pruritic rash in groin and vulva areas.    Personal health questionnaire:  Is patient [name], have a family history of breast and/or ovarian cancer: no Is there a family history of uterine cancer diagnosed at age < 46, gastrointestinal cancer, urinary tract cancer, family member who is a Field seismologist syndrome-associated carrier: no Is the patient overweight and hypertensive, family history of diabetes, personal history of gestational diabetes, preeclampsia or PCOS: yes Is patient over 20, have PCOS,  family history of premature CHD under age 40, diabetes, smoke, have hypertension or peripheral artery disease:  no At any time, has a partner hit, kicked or otherwise hurt or frightened you?: no Over the past 2 weeks, have you felt down, depressed or hopeless?: no Over the past 2 weeks, have you felt little interest or pleasure in doing things?:no   Gynecologic History Patient's last menstrual period was 08/03/2020. Contraception: OCP (estrogen/progesterone) Last Pap: 04-24-2019. Results were: normal Last mammogram: 12-17-2018. Results were: normal  Obstetric History OB History  Gravida Para Term Preterm AB Living  3 3 3     3   SAB TAB Ectopic Multiple Live Births          3    # Outcome Date GA Lbr Len/2nd Weight Sex Delivery Anes PTL Lv  3 Term 07/01/06 [redacted]w[redacted]d  F Vag-Spont EPI  LIV  2 Term 08/31/96 453w0d F Vag-Spont EPI  LIV  1 Term 04/21/90 [id number] Vag-Spont Gen  LIV    Past Medical History:  Diagnosis Date  . Asthma   . GERD (gastroesophageal reflux disease)   . Hypersomnia with sleep apnea   . Hypertension   . Migraine   . Rhinitis     Past Surgical History:  Procedure Laterality Date  . CHOLECYSTECTOMY    . TOOTH EXTRACTION       Current Outpatient Medications:  .  amoxicillin-clavulanate (AUGMENTIN) 875-125 MG tablet, Take 1 tablet by mouth 2 (two)  times daily for 10 days., Disp: 20 tablet, Rfl: 0 .  clobetasol ointment (TEMOVATE) 0.05 %, APPLY TO AFFECTED AREA TWICE A DAY, Disp: 60 g, Rfl: 1 .  diclofenac (VOLTAREN) 75 MG EC tablet, TAKE 1 TABLET BY MOUTH TWICE A DAY, Disp: 60 tablet, Rfl: 2 .  fluticasone (FLONASE) 50 MCG/ACT nasal spray, Place 2 sprays into the nose daily as needed for allergies. , Disp: , Rfl:  .  indapamide (LOZOL) 1.25 MG tablet, TAKE 1 TABLET BY MOUTH EVERY DAY, Disp: 90 tablet, Rfl: 0 .  levocetirizine (XYZAL) 5 MG tablet, TAKE 1 TABLET BY MOUTH EVERY DAY IN THE EVENING, Disp: 90 tablet, Rfl: 1 .  lisdexamfetamine (VYVANSE) 20 MG capsule, Take 1 capsule (20 mg total) by mouth daily., Disp: 30 capsule, Rfl: 0 .  methimazole (TAPAZOLE) 10 MG tablet, Take 1 tablet (10 mg total) by mouth 2 (two) times daily. Must be seen for refills., Disp: 60 tablet, Rfl: 0 .  montelukast (SINGULAIR) 10 MG tablet, Take 1 tablet (10 mg total) by mouth at bedtime., Disp: 90 tablet, Rfl: 1 .  Multiple Vitamin (MULTIVITAMIN) tablet, Take 1 tablet by mouth daily., Disp: , Rfl:  .  omeprazole (PRILOSEC) 40 MG capsule, Take 40 mg by mouth daily., Disp: , Rfl:  .  potassium chloride SA (KLOR-CON) 20 MEQ tablet, Take 1 tablet (20 mEq total)  by mouth daily., Disp: 180 tablet, Rfl: 0 .  Rimegepant Sulfate (NURTEC) 75 MG TBDP, Take 1 tablet by mouth daily as needed., Disp: 8 tablet, Rfl: 0 .  solifenacin (VESICARE) 10 MG tablet, Take 1 tablet (10 mg total) by mouth daily., Disp: 90 tablet, Rfl: 1 .  tinidazole (TINDAMAX) 500 MG tablet, TAKE 2 TABLETS BY MOUTH EVERY DAY WITH BREAKFAST, Disp: 10 tablet, Rfl: 2 .  clotrimazole-betamethasone (LOTRISONE) cream, Apply 1 application topically 2 (two) times daily., Disp: 45 g, Rfl: 0 .  fluconazole (DIFLUCAN) 200 MG tablet, Take 1 tablet (200 mg total) by mouth every 3 (three) days., Disp: 3 tablet, Rfl: 2 .  Insulin Pen Needle (NOVOFINE) 32G X 6 MM MISC, 1 Act by Does not apply route once a week., Disp: 50  each, Rfl: 0 .  nadolol (CORGARD) 20 MG tablet, Take 1 tablet (20 mg total) by mouth daily. (Patient not taking: Reported on 08/31/2020), Disp: 90 tablet, Rfl: 3 .  Semaglutide, 1 MG/DOSE, (OZEMPIC, 1 MG/DOSE,) 4 MG/3ML SOPN, Inject 1 mg into the skin once a week., Disp: 9 mL, Rfl: 1 Allergies  Allergen Reactions  . Codeine Hives and Nausea And Vomiting  . Tramadol Diarrhea and Nausea And Vomiting    Social History   Tobacco Use  . Smoking status: Former Smoker    Packs/day: 0.10    Years: 1.00    Pack years: 0.10    Types: Cigarettes    Quit date: 10/09/1999    Years since quitting: 20.9  . Smokeless tobacco: Never Used  Substance Use Topics  . Alcohol use: Yes    Comment: 1 glass of white wine monthly    Family History  Problem Relation Age of Onset  . Diabetes Father   . Early death Father   . Diabetes Mother   . COPD Mother   . Rheum arthritis Mother   . Hyperlipidemia Brother   . Thyroid disease Brother   . Alcohol abuse Neg Hx   . Cancer Neg Hx   . Depression Neg Hx   . Drug abuse Neg Hx   . Hearing loss Neg Hx   . Heart disease Neg Hx   . Hypertension Neg Hx   . Stroke Neg Hx       Review of Systems  Constitutional: negative for fatigue and weight loss Respiratory: negative for cough and wheezing Cardiovascular: negative for chest pain, fatigue and palpitations Gastrointestinal: negative for abdominal pain and change in bowel habits Musculoskeletal:negative for myalgias Neurological: negative for gait problems and tremors Behavioral/Psych: negative for abusive relationship, depression Endocrine: negative for temperature intolerance    Genitourinary:negative for abnormal menstrual periods, genital lesions, hot flashes, sexual problems and vaginal discharge Integument/breast: negative for breast lump, breast tenderness, nipple discharge and skin lesion(s)    Objective:       BP (!) 137/95   Pulse 81   Ht 5' 5"  (1.651 m)   Wt (!) 305 lb 8 oz (138.6 kg)    LMP 08/03/2020   BMI 50.84 kg/m  General:   alert and no distress  Skin:   no rash or abnormalities  Lungs:   clear to auscultation bilaterally  Heart:   regular rate and rhythm, S1, S2 normal, no murmur, click, rub or gallop  Breasts:   normal without suspicious masses, skin or nipple changes or axillary nodes  Abdomen:  normal findings: no organomegaly, soft, non-tender and no hernia  Pelvis:  External genitalia: normal general appearance Urinary system: urethral  meatus normal and bladder without fullness, nontender Vaginal: normal without tenderness, induration or masses Cervix: normal appearance Adnexa: normal bimanual exam Uterus: anteverted and non-tender, normal size   Lab Review Urine pregnancy test Labs reviewed yes Radiologic studies reviewed yes  50% of 20 min visit spent on counseling and coordination of care.   Assessment:     1. Encounter for routine gynecological examination with Papanicolaou smear of cervix Rx: - Cytology - PAP  2. Vaginal discharge Rx: - Cervicovaginal ancillary only  3. Candida vaginitis Rx: - fluconazole (DIFLUCAN) 200 MG tablet; Take 1 tablet (200 mg total) by mouth every 3 (three) days.  Dispense: 3 tablet; Refill: 2 4. Rash and nonspecific skin eruption Rx: - clotrimazole-betamethasone (LOTRISONE) cream; Apply 1 application topically 2 (two) times daily.  Dispense: 45 g; Refill: 0  5. Stress incontinence of urine - mild leakage with cough, sneeze, etc.   - improved with medication  6. Class 3 severe obesity without serious comorbidity with body mass index (BMI) of 50.0 to 59.9 in adult, unspecified obesity type ([place]) - program of caloric reduction, exercise and behavioral modification recommended  7. Encounter for surveillance of contraceptive pills - pleased with Sprintec 28    Plan:    Education reviewed: calcium supplements, depression evaluation, low fat, low cholesterol diet, safe sex/STD prevention, self breast exams  and weight bearing exercise. Contraception: OCP (estrogen/progesterone). Follow up in: 1 year.   Meds ordered this encounter  Medications  . fluconazole (DIFLUCAN) 200 MG tablet    Sig: Take 1 tablet (200 mg total) by mouth every 3 (three) days.    Dispense:  3 tablet    Refill:  2  . clotrimazole-betamethasone (LOTRISONE) cream    Sig: Apply 1 application topically 2 (two) times daily.    Dispense:  45 g    Refill:  0    Shelly Bombard, MD 08/31/2020 12:12 PM

## 2020-09-02 ENCOUNTER — Other Ambulatory Visit: Payer: Self-pay | Admitting: Obstetrics

## 2020-09-02 DIAGNOSIS — N760 Acute vaginitis: Secondary | ICD-10-CM

## 2020-09-02 LAB — CERVICOVAGINAL ANCILLARY ONLY
Bacterial Vaginitis (gardnerella): POSITIVE — AB
Candida Glabrata: NEGATIVE
Candida Vaginitis: POSITIVE — AB
Chlamydia: NEGATIVE
Comment: NEGATIVE
Comment: NEGATIVE
Comment: NEGATIVE
Comment: NEGATIVE
Comment: NEGATIVE
Comment: NORMAL
Neisseria Gonorrhea: NEGATIVE
Trichomonas: NEGATIVE

## 2020-09-02 MED ORDER — TINIDAZOLE 500 MG PO TABS: ORAL_TABLET | ORAL | 2 refills | Status: DC

## 2020-09-05 ENCOUNTER — Other Ambulatory Visit: Payer: Self-pay | Admitting: Obstetrics

## 2020-09-06 LAB — CYTOLOGY - PAP
Adequacy: ABSENT
Comment: NEGATIVE
Diagnosis: NEGATIVE
Diagnosis: REACTIVE
High risk HPV: NEGATIVE

## 2020-09-11 ENCOUNTER — Other Ambulatory Visit: Payer: Self-pay | Admitting: Internal Medicine

## 2020-09-14 ENCOUNTER — Other Ambulatory Visit: Payer: Self-pay | Admitting: Internal Medicine

## 2020-09-14 DIAGNOSIS — N3281 Overactive bladder: Secondary | ICD-10-CM

## 2020-09-15 ENCOUNTER — Other Ambulatory Visit: Payer: Self-pay | Admitting: Internal Medicine

## 2020-09-15 DIAGNOSIS — N3281 Overactive bladder: Secondary | ICD-10-CM

## 2020-09-15 MED ORDER — SOLIFENACIN SUCCINATE 10 MG PO TABS: 10.0000 mg | ORAL_TABLET | Freq: Every day | ORAL | 1 refills | Status: DC

## 2020-09-23 ENCOUNTER — Other Ambulatory Visit: Payer: Self-pay | Admitting: Internal Medicine

## 2020-09-23 DIAGNOSIS — F5081 Binge eating disorder: Secondary | ICD-10-CM

## 2020-09-23 DIAGNOSIS — J301 Allergic rhinitis due to pollen: Secondary | ICD-10-CM

## 2020-09-26 ENCOUNTER — Other Ambulatory Visit: Payer: Self-pay | Admitting: Internal Medicine

## 2020-09-26 DIAGNOSIS — F5081 Binge eating disorder: Secondary | ICD-10-CM

## 2020-09-26 MED ORDER — LISDEXAMFETAMINE DIMESYLATE 30 MG PO CAPS: 30.0000 mg | ORAL_CAPSULE | Freq: Every day | ORAL | 0 refills | Status: DC

## 2020-10-02 ENCOUNTER — Encounter: Payer: Self-pay | Admitting: Internal Medicine

## 2020-10-03 ENCOUNTER — Encounter (HOSPITAL_COMMUNITY): Payer: Self-pay

## 2020-10-03 ENCOUNTER — Ambulatory Visit: Payer: Self-pay | Admitting: Internal Medicine

## 2020-10-03 ENCOUNTER — Emergency Department (HOSPITAL_COMMUNITY)
Admission: EM | Admit: 2020-10-03 | Discharge: 2020-10-03 | Disposition: A | Discharge status: Home / Self Care | Payer: BC Managed Care – PPO | Attending: Emergency Medicine | Admitting: Emergency Medicine

## 2020-10-03 ENCOUNTER — Emergency Department (HOSPITAL_COMMUNITY): Payer: BC Managed Care – PPO

## 2020-10-03 ENCOUNTER — Other Ambulatory Visit: Payer: Self-pay

## 2020-10-03 DIAGNOSIS — Z79899 Other long term (current) drug therapy: Secondary | ICD-10-CM | POA: Insufficient documentation

## 2020-10-03 DIAGNOSIS — U071 COVID-19: Secondary | ICD-10-CM | POA: Diagnosis not present

## 2020-10-03 DIAGNOSIS — R059 Cough, unspecified: Secondary | ICD-10-CM | POA: Diagnosis present

## 2020-10-03 DIAGNOSIS — J45909 Unspecified asthma, uncomplicated: Secondary | ICD-10-CM | POA: Insufficient documentation

## 2020-10-03 DIAGNOSIS — I10 Essential (primary) hypertension: Secondary | ICD-10-CM | POA: Insufficient documentation

## 2020-10-03 DIAGNOSIS — R079 Chest pain, unspecified: Secondary | ICD-10-CM

## 2020-10-03 DIAGNOSIS — Z87891 Personal history of nicotine dependence: Secondary | ICD-10-CM | POA: Insufficient documentation

## 2020-10-03 LAB — CBC
HCT: 42.7 % (ref 36.0–46.0)
Hemoglobin: 13.6 g/dL (ref 12.0–15.0)
MCH: 26.4 pg (ref 26.0–34.0)
MCHC: 31.9 g/dL (ref 30.0–36.0)
MCV: 82.9 fL (ref 80.0–100.0)
Platelets: 339 10*3/uL (ref 150–400)
RBC: 5.15 MIL/uL — ABNORMAL HIGH (ref 3.87–5.11)
RDW: 13.6 % (ref 11.5–15.5)
WBC: 8.1 10*3/uL (ref 4.0–10.5)
nRBC: 0 % (ref 0.0–0.2)

## 2020-10-03 LAB — BASIC METABOLIC PANEL
Anion gap: 12 (ref 5–15)
BUN: 13 mg/dL (ref 6–20)
CO2: 26 mmol/L (ref 22–32)
Calcium: 8.9 mg/dL (ref 8.9–10.3)
Chloride: 99 mmol/L (ref 98–111)
Creatinine, Ser: 0.87 mg/dL (ref 0.44–1.00)
GFR, Estimated: >60 mL/min (ref >60)
Glucose, Bld: 89 mg/dL (ref 70–99)
Potassium: 3.4 mmol/L — ABNORMAL LOW (ref 3.5–5.1)
Sodium: 137 mmol/L (ref 135–145)

## 2020-10-03 LAB — TROPONIN I (HIGH SENSITIVITY)
Troponin I (High Sensitivity): 3 ng/L (ref <18)
Troponin I (High Sensitivity): 4 ng/L (ref <18)

## 2020-10-03 LAB — I-STAT BETA HCG BLOOD, ED (MC, WL, AP ONLY): I-stat hCG, quantitative: <5 m[IU]/mL (ref <5)

## 2020-10-03 LAB — RESP PANEL BY RT-PCR (FLU A&B, COVID) ARPGX2
Influenza A by PCR: NEGATIVE
Influenza B by PCR: NEGATIVE
SARS Coronavirus 2 by RT PCR: POSITIVE — AB

## 2020-10-03 MED ORDER — BENZONATATE 100 MG PO CAPS: 100.0000 mg | ORAL_CAPSULE | Freq: Three times a day (TID) | ORAL | 0 refills | Status: DC

## 2020-10-03 MED ORDER — ACETAMINOPHEN ER 650 MG PO TBCR: 650.0000 mg | EXTENDED_RELEASE_TABLET | Freq: Three times a day (TID) | ORAL | 0 refills | Status: DC | PRN

## 2020-10-03 MED ORDER — ONDANSETRON 4 MG PO TBDP: 4.0000 mg | ORAL_TABLET | Freq: Three times a day (TID) | ORAL | 0 refills | Status: DC | PRN

## 2020-10-03 NOTE — ED Provider Notes (Signed)
[facility] EMERGENCY DEPARTMENT Provider Note   CSN: [id number] Arrival date & time: 10/03/20  1210     History Chief Complaint  Patient presents with  . Chest Pain    Alicia Hoover is a 44 y.o. female with a past medical history significant for asthma, GERD, hypertension, and hx of migraines who presents to the ED due to multiple complaints of headache, cough, fatigue, nasal congestion x 3 days.  Patient states she took a home Covid test on 12/26 which was positive.  She also admits to chest pain which she describes as "indigestion" she also describes it as "rib cage type pain". she states chest pain is associated with shortness of breath.  She notes her chest pain now only occurs when she is coughing. She admits to having palpitations at baseline which is worsened after testing positive for Covid.  Patient describes palpitations as "skipping a beat".  Patient denies nausea vomiting.  Denies history of blood clots or surgeries, recent long immobilizations, lower extremity edema. No treatment prior to arrival. Denies tobacco and drug use. Admits to occasional alcohol use. No previous Mis/CVAs. No family history of early CAD.   History obtained from patient and past medical records. No interpreter used during encounter.   HPI: A 44 year old patient with a history of hypertension and obesity presents for evaluation of chest pain. Initial onset of pain was less than one hour ago. The patient's chest pain is described as heaviness/pressure/tightness, is sharp and is not worse with exertion. The patient's chest pain is not middle- or left-sided, is not well-localized and does not radiate to the arms/jaw/neck. The patient does not complain of nausea and denies diaphoresis. The patient has no history of stroke, has no history of peripheral artery disease, has not smoked in the past 90 days, denies any history of treated diabetes, has no relevant family history of coronary artery  disease (first degree relative at less than age 2) and has no history of hypercholesterolemia.   Past Medical History:  Diagnosis Date  . Asthma   . GERD (gastroesophageal reflux disease)   . Hypersomnia with sleep apnea   . Hypertension   . Migraine   . Rhinitis     Patient Active Problem List   Diagnosis Date Noted  . Diuretic-induced hypokalemia 08/19/2020  . Binge-eating disorder, moderate 08/18/2020  . Migraine without aura and without status migrainosus, not intractable 08/18/2020  . Seasonal allergic rhinitis due to pollen 04/12/2020  . Graves disease 10/12/2019  . OAB (overactive bladder) 04/08/2019  . Depression with anxiety 04/08/2019  . Visit for screening mammogram 03/27/2017  . Prediabetes 03/08/2016  . Eczema, allergic 03/08/2016  . Routine general medical examination at a health care facility 02/24/2014  . Essential hypertension, benign 02/24/2014  . Morbid obesity ([place]) 11/01/2010  . HYPERSOMNIA WITH SLEEP APNEA UNSPECIFIED 10/16/2010    Past Surgical History:  Procedure Laterality Date  . CHOLECYSTECTOMY    . TOOTH EXTRACTION       OB History    Gravida  3   Para  3   Term  3   Preterm      AB      Living  3     SAB      IAB      Ectopic      Multiple      Live Births  3           Family History  Problem Relation Age of Onset  .  Diabetes Father   . Early death Father   . Diabetes Mother   . COPD Mother   . Rheum arthritis Mother   . Hyperlipidemia Brother   . Thyroid disease Brother   . Alcohol abuse Neg Hx   . Cancer Neg Hx   . Depression Neg Hx   . Drug abuse Neg Hx   . Hearing loss Neg Hx   . Heart disease Neg Hx   . Hypertension Neg Hx   . Stroke Neg Hx     Social History   Tobacco Use  . Smoking status: Former Smoker    Packs/day: 0.10    Years: 1.00    Pack years: 0.10    Types: Cigarettes    Quit date: 10/09/1999    Years since quitting: 21.0  . Smokeless tobacco: Never Used  Substance Use Topics  .  Alcohol use: Yes    Comment: 1 glass of white wine monthly  . Drug use: No    Home Medications Prior to Admission medications   Medication Sig Start Date End Date Taking? Authorizing Provider  acetaminophen (TYLENOL 8 HOUR) 650 MG CR tablet Take 1 tablet (650 mg total) by mouth every 8 (eight) hours as needed for pain. 10/03/20  Yes Solene Hereford, Druscilla Brownie, PA-C  benzonatate (TESSALON) 100 MG capsule Take 1 capsule (100 mg total) by mouth every 8 (eight) hours. 10/03/20  Yes Daymein Nunnery, Druscilla Brownie, PA-C  clotrimazole-betamethasone (LOTRISONE) cream Apply 1 application topically 2 (two) times daily. Patient taking differently: Apply 1 application topically 2 (two) times daily as needed (rash). 08/31/20  Yes Shelly Bombard, MD  diclofenac (VOLTAREN) 75 MG EC tablet TAKE 1 TABLET BY MOUTH TWICE A DAY Patient taking differently: Take 75 mg by mouth 2 (two) times daily as needed for mild pain. 05/30/20  Yes [name], MD  fluconazole (DIFLUCAN) 200 MG tablet Take 1 tablet (200 mg total) by mouth every 3 (three) days. Patient taking differently: Take 200 mg by mouth every three (3) days as needed (for yeast infection). 08/31/20  Yes Shelly Bombard, MD  fluticasone (FLONASE) 50 MCG/ACT nasal spray Place 2 sprays into the nose daily.   Yes [provider]  indapamide (LOZOL) 1.25 MG tablet TAKE 1 TABLET BY MOUTH EVERY DAY Patient taking differently: Take 1.25 mg by mouth daily. 06/11/20  Yes [name], MD  levocetirizine (XYZAL) 5 MG tablet TAKE 1 TABLET BY MOUTH EVERY DAY IN THE EVENING Patient taking differently: Take 5 mg by mouth every evening. 09/22/19  Yes [name], MD  lisdexamfetamine (VYVANSE) 30 MG capsule Take 1 capsule (30 mg total) by mouth daily. 09/26/20  Yes [name], MD  methimazole (TAPAZOLE) 10 MG tablet Take 1 tablet (10 mg total) by mouth 2 (two) times daily. NEED APPOINTMENT FOR ANY MORE REFILLS Patient taking differently: No sig reported 09/12/20   Yes Shamleffer, Melanie Crazier, MD  montelukast (SINGULAIR) 10 MG tablet TAKE 1 TABLET BY MOUTH EVERYDAY AT BEDTIME Patient taking differently: Take 10 mg by mouth at bedtime. 09/23/20  Yes [name], MD  Multiple Vitamin (MULTIVITAMIN) tablet Take 1 tablet by mouth daily.   Yes [provider]  nadolol (CORGARD) 20 MG tablet Take 1 tablet (20 mg total) by mouth daily. Patient taking differently: Take 20 mg by mouth at bedtime. 11/10/19  Yes Allred, Jeneen Rinks, MD  norgestimate-ethinyl estradiol (ORTHO-CYCLEN) 0.25-35 MG-MCG tablet Take 1 tablet by mouth at bedtime. 09/14/20  Yes [provider]  omeprazole (PRILOSEC) 40 MG capsule Take 40 mg by mouth daily.   Yes [provider]  ondansetron (ZOFRAN ODT) 4 MG disintegrating tablet Take 1 tablet (4 mg total) by mouth every 8 (eight) hours as needed for nausea or vomiting. 10/03/20  Yes Vermell Madrid C, PA-C  potassium chloride SA (KLOR-CON) 20 MEQ tablet Take 1 tablet (20 mEq total) by mouth daily. 08/19/20  Yes [name], MD  Rimegepant Sulfate (NURTEC) 75 MG TBDP Take 1 tablet by mouth daily as needed. Patient taking differently: Take 1 tablet by mouth daily as needed (migraine headache). 08/18/20  Yes [name], MD  Semaglutide, 1 MG/DOSE, (OZEMPIC, 1 MG/DOSE,) 4 MG/3ML SOPN Inject 1 mg into the skin once a week. Patient taking differently: Inject 1 mg into the skin every Monday. 07/21/20  Yes [name], MD  solifenacin (VESICARE) 10 MG tablet Take 1 tablet (10 mg total) by mouth daily. 09/15/20  Yes [name], MD  TRINTELLIX 10 MG TABS tablet Take 10 mg by mouth daily. 09/14/20  Yes [provider]  Insulin Pen Needle (NOVOFINE) 32G X 6 MM MISC 1 Act by Does not apply route once a week. 04/12/20   [name], MD  diclofenac (VOLTAREN) 75 MG EC tablet TAKE 1 TABLET BY MOUTH TWICE A DAY 03/25/20   [name], MD  indapamide (LOZOL) 1.25 MG tablet TAKE 1 TABLET BY MOUTH EVERY DAY  05/07/20   [name], MD  tolterodine (DETROL LA) 2 MG 24 hr capsule TAKE 1 CAPSULE (2 MG TOTAL) BY MOUTH DAILY. 10/24/19   [name], MD    Allergies    Codeine and Tramadol  Review of Systems   Review of Systems  Constitutional: Positive for chills, fatigue and fever.  HENT: Positive for congestion and sore throat.   Respiratory: Positive for cough and shortness of breath.   Cardiovascular: Positive for chest pain.  Gastrointestinal: Negative for abdominal pain, diarrhea, nausea and vomiting.  Genitourinary: Negative for dysuria.  Neurological: Positive for headaches. Negative for dizziness, facial asymmetry and speech difficulty.  All other systems reviewed and are negative.   Physical Exam Updated Vital Signs BP 111/67   Pulse 63   Temp 98.2 F (36.8 C) (Oral)   Resp 15   Ht 5' 5"  (1.651 m)   Wt 136.1 kg   SpO2 99%   BMI 49.92 kg/m   Physical Exam Vitals and nursing note reviewed.  Constitutional:      General: She is not in acute distress.    Appearance: She is not toxic-appearing.  HENT:     Head: Normocephalic.  Eyes:     Pupils: Pupils are equal, round, and reactive to light.  Neck:     Comments: No meningismus. Cardiovascular:     Rate and Rhythm: Normal rate and regular rhythm.     Pulses: Normal pulses.     Heart sounds: Normal heart sounds. No murmur heard. No friction rub. No gallop.   Pulmonary:     Effort: Pulmonary effort is normal.     Breath sounds: Normal breath sounds.     Comments: Respirations equal and unlabored, patient able to speak in full sentences, lungs clear to auscultation bilaterally Abdominal:     General: Abdomen is flat. There is no distension.     Palpations: Abdomen is soft.     Tenderness: There is no abdominal tenderness. There is no guarding or rebound.  Musculoskeletal:  Cervical back: Neck supple.     Comments: No lower extremity edema. Negative homan sign bilaterally.  Skin:    General: Skin is warm  and dry.  Neurological:     General: No focal deficit present.     Mental Status: She is alert and oriented to person, place, and time.     Cranial Nerves: No cranial nerve deficit.     Motor: No weakness.  Psychiatric:        Mood and Affect: Mood normal.        Behavior: Behavior normal.     ED Results / Procedures / Treatments   Labs (all labs ordered are listed, but only abnormal results are displayed) Labs Reviewed  RESP PANEL BY RT-PCR (FLU A&B, COVID) ARPGX2 - Abnormal; Notable for the following components:      Result Value   SARS Coronavirus 2 by RT PCR POSITIVE (*)    All other components within normal limits  BASIC METABOLIC PANEL - Abnormal; Notable for the following components:   Potassium 3.4 (*)    All other components within normal limits  CBC - Abnormal; Notable for the following components:   RBC 5.15 (*)    All other components within normal limits  I-STAT BETA HCG BLOOD, ED ([facility], [facility], [facility] ONLY)  TROPONIN I (HIGH SENSITIVITY)  TROPONIN I (HIGH SENSITIVITY)    EKG None  Radiology DG Chest Portable 1 View  Result Date: 10/03/2020 CLINICAL DATA:  Chest pain, COVID positive EXAM: PORTABLE CHEST 1 VIEW COMPARISON:  04/29/2004 chest radiograph. FINDINGS: Stable cardiomediastinal silhouette with normal heart size. No pneumothorax. No pleural effusion. Lungs appear clear, with no acute consolidative airspace disease and no pulmonary edema. IMPRESSION: No active disease. Electronically Signed   By: Ilona Sorrel M.D.   On: 10/03/2020 14:25    Procedures Procedures (including critical care time)  Medications Ordered in ED Medications - No data to display  ED Course  I have reviewed the triage vital signs and the nursing notes.  Pertinent labs & imaging results that were available during my care of the patient were reviewed by me and considered in my medical decision making (see chart for details).  Clinical Course as of 10/03/20 2154  Mon Oct 03, 2020  1934  SARS Coronavirus 2 by RT PCR(!): POSITIVE [CA]    Clinical Course User Index [CA] Suzy Bouchard, PA-C   MDM Rules/Calculators/A&P HEAR Score: 1                       44 year old female presents to the ED due to multiple points of nasal congestion, cough, chest pain, shortness of breath, and headache.  Patient took at home Covid test on 12/26 which was positive.  Patient denies history of blood clots, recent surgeries, recent long immobilizations, hemoptysis, lower extremity edema.  Upon arrival, vitals all within normal limits.  Patient is afebrile, not tachycardic or hypoxic.  Patient in no acute distress and nontoxic-appearing.  Physical exam reassuring.  Lungs clear to auscultation bilaterally.  No clinical signs of DVT on exam.  Abdomen soft, nondistended, nontender.  Routine labs, troponin, chest x-ray, and EKG ordered at triage. Presentation nonconcerning for PE. No clinical signs of DVT on exam. Low suspicion for dissection. Suspect symptoms related to COVID infection.  Covid positive.  Influenza negative.  Pregnancy test negative.  Initial troponin normal at 3.  Will obtain delta troponin to rule out ACS.  CBC reassuring with no leukocytosis and normal  hemoglobin.  BMP significant for mild hypokalemia 3.4, but otherwise unremarkable.  Normal renal function no major electrolyte derangements.  Chest x-ray personally reviewed which is negative for signs pneumonia, pneumothorax, or widened mediastinum.  EKG personally reviewed which demonstrates normal sinus rhythm with no signs of acute ischemia.  Patient handed off to Dr. Tamera Punt at shift change pending delta troponin. If normal, patient may be discharged with symptomatic treatment.  Alicia Hoover was evaluated in Emergency Department on 10/03/2020 for the symptoms described in the history of present illness. She was evaluated in the context of the global COVID-19 pandemic, which necessitated consideration that the patient might be at risk  for infection with the SARS-CoV-2 virus that causes COVID-19. Institutional protocols and algorithms that pertain to the evaluation of patients at risk for COVID-19 are in a state of rapid change based on information released by regulatory bodies including the CDC and federal and state organizations. These policies and algorithms were followed during the patient's care in the ED. Final Clinical Impression(s) / ED Diagnoses Final diagnoses:  COVID-19 virus infection  Nonspecific chest pain    Rx / DC Orders ED Discharge Orders         Ordered    benzonatate (TESSALON) 100 MG capsule  Every 8 hours        10/03/20 2153    ondansetron (ZOFRAN ODT) 4 MG disintegrating tablet  Every 8 hours PRN        10/03/20 2153    acetaminophen (TYLENOL 8 HOUR) 650 MG CR tablet  Every 8 hours PRN        10/03/20 2153           Suzy Bouchard, PA-C 10/03/20 2216    Malvin Johns, MD 10/03/20 2333

## 2020-10-03 NOTE — ED Notes (Signed)
Pt states she came in due to COVID symptoms. Pt has congestion, a cough, chest pain, and runny nose that started Friday. Home COVID test was positive as of yesterday. Denies pain at this time unless she is coughing.

## 2020-10-03 NOTE — Telephone Encounter (Signed)
Pt. Called with multiple complaints. C/o head and chest congestion, cough, fatigue, and shortness of breath.  Stated she tested positive at home, for COVID, on 12/26.  Reported onset of head and chest congestion, and cough on 12/24.  Reported onset of Short of breath on 12/25.  Stated the shortness of breath occurs with walking.  C/o right sided chest discomfort with cough.  Reported she "feels hot", and previously had not been running a temperature.  Has not checked her temperature today.  Feels her chest congestion and cough are worse today.  Reported she called her PCP office, and was advised to contact the [facility]; stated she did not get an answer at the [facility].  This nurse attempted to contact [facility] during call; no answer.  Advised to go to the ER for worsening chest congestion and right sided chest discomfort.  Pt. Verb. Understanding and agreed with plan.  Will route note to PCP.     Reason for Disposition  Chest pain or pressure    C/o right sided chest discomfort with cough; reported her cough and congestion is worse.  Answer Assessment - Initial Assessment Questions 1. COVID-19 DIAGNOSIS: "Who made your COVID-19 diagnosis?" "Was it confirmed by a positive lab test?" If not diagnosed by a HCP, ask "Are there lots of cases (community spread) where you live?" Note: See public health department website, if unsure.     Home test positive 12/26 2. COVID-19 EXPOSURE: "Was there any known exposure to COVID before the symptoms began?" CDC Definition of close contact: within 6 feet (2 meters) for a total of 15 minutes or more over a 24-hour period.      *No Answer* 3. ONSET: "When did the COVID-19 symptoms start?"      12/24 4. WORST SYMPTOM: "What is your worst symptom?" (e.g., cough, fever, shortness of breath, muscle aches)     Cough and head and chest congestion 5. COUGH: "Do you have a cough?" If Yes, ask: "How bad is the cough?"       Coughing up yellow mucus 6.  FEVER: "Do you have a fever?" If Yes, ask: "What is your temperature, how was it measured, and when did it start?"     No temperature when checking, but feels "Hot" 7. RESPIRATORY STATUS: "Describe your breathing?" (e.g., shortness of breath, wheezing, unable to speak)      Shortness of breath with activity 8. BETTER-SAME-WORSE: "Are you getting better, staying the same or getting worse compared to yesterday?"  If getting worse, ask, "In what way?"     The congestion and cough 9. HIGH RISK DISEASE: "Do you have any chronic medical problems?" (e.g., asthma, heart or lung disease, weak immune system, obesity, etc.)     Denied any heart or lung disease or being immunocompromised  10. VACCINE: "Have you gotten the COVID-19 vaccine?" If Yes ask: "Which one, how many shots, when did you get it?"       Vaccine x 2  11. PREGNANCY: "Is there any chance you are pregnant?" "When was your last menstrual period?"       LMP- completed cycle 12/26 12. OTHER SYMPTOMS: "Do you have any other symptoms?"  (e.g., chills, fatigue, headache, loss of smell or taste, muscle pain, sore throat; new loss of smell or taste especially support the diagnosis of COVID-19)     Nasal congestion, headache, fatigue, sore throat, productive cough, right sided chest discomfort with cough  Protocols used: CORONAVIRUS (COVID-19) DIAGNOSED OR  [place]

## 2020-10-03 NOTE — ED Notes (Signed)
Pt ambulated in room on room air well. O2 Sats stayed between 97-100% during ambulation. No increased SOB or WOB noted.

## 2020-10-04 ENCOUNTER — Other Ambulatory Visit: Payer: Self-pay | Admitting: Obstetrics

## 2020-10-04 ENCOUNTER — Other Ambulatory Visit: Payer: Self-pay | Admitting: Internal Medicine

## 2020-10-04 DIAGNOSIS — N3281 Overactive bladder: Secondary | ICD-10-CM

## 2020-10-07 ENCOUNTER — Other Ambulatory Visit: Payer: Self-pay | Admitting: Internal Medicine

## 2020-10-11 ENCOUNTER — Other Ambulatory Visit: Payer: Self-pay | Admitting: Internal Medicine

## 2020-10-16 ENCOUNTER — Other Ambulatory Visit: Payer: Self-pay | Admitting: Internal Medicine

## 2020-10-16 ENCOUNTER — Other Ambulatory Visit: Payer: Self-pay | Admitting: Obstetrics

## 2020-10-16 DIAGNOSIS — B373 Candidiasis of vulva and vagina: Secondary | ICD-10-CM

## 2020-10-16 DIAGNOSIS — B3731 Acute candidiasis of vulva and vagina: Secondary | ICD-10-CM

## 2020-10-25 ENCOUNTER — Other Ambulatory Visit: Payer: Self-pay | Admitting: Internal Medicine

## 2020-10-25 DIAGNOSIS — N3281 Overactive bladder: Secondary | ICD-10-CM

## 2020-11-10 ENCOUNTER — Other Ambulatory Visit: Payer: Self-pay | Admitting: Internal Medicine

## 2020-11-10 DIAGNOSIS — I10 Essential (primary) hypertension: Secondary | ICD-10-CM

## 2020-11-16 ENCOUNTER — Other Ambulatory Visit: Payer: Self-pay | Admitting: Internal Medicine

## 2020-11-16 DIAGNOSIS — N3281 Overactive bladder: Secondary | ICD-10-CM

## 2020-11-16 DIAGNOSIS — L239 Allergic contact dermatitis, unspecified cause: Secondary | ICD-10-CM

## 2020-11-17 ENCOUNTER — Telehealth: Payer: Self-pay | Admitting: Internal Medicine

## 2020-11-17 NOTE — Telephone Encounter (Signed)
Please advise 

## 2020-11-17 NOTE — Telephone Encounter (Signed)
Patient has made an appointment for 11/24/20 - she wanted to find out if Dr Kelton Pillar wanted her to do labs before her appointment. Please call patient at [phone]

## 2020-11-18 NOTE — Telephone Encounter (Signed)
Message left for patient to return my call.  

## 2020-11-18 NOTE — Telephone Encounter (Signed)
Spoken to patient and notified Dr Quin Hoop comments. Verbalized understanding.

## 2020-11-18 NOTE — Telephone Encounter (Signed)
No. I do all labs needed on the day of the test. Pt has not been seen in a year.

## 2020-11-23 NOTE — Progress Notes (Deleted)
Name: Alicia Hoover  MRN/ DOB: [id number], May 27, 1976    Age/ Sex: 45 y.o., female     PCP: Alicia Lima, MD   Reason for Endocrinology Evaluation: Hyperthyroidism     Initial Endocrinology Clinic Visit: 07/10/2019    PATIENT IDENTIFIER: Alicia Hoover is a 45 y.o., female with a past medical history of HTN, Obesity and Depression. She has followed with [facility] since 07/10/2019 for consultative assistance with management of her hyperthyroidism  HISTORICAL SUMMARY: The patient was first diagnosed with subclinical hyperthyroidism in 04/2019 with a TSh of 0.31 uIU/mL . She was started on Methimazole 04/2019 TRAb elevated at 14.60 IU/L  No Amiodarone  Was on Biotin until 05/2019  No radiation exposure.    Brother with thyroid disease   SUBJECTIVE:    Today (11/24/2020):  Alicia Hoover is here for a follow up on hyperthyroidism secondary to Graves' disease.  She has not been to our clinic in a year.    She denies weight gain but endorses palpitations. She continues with heat intolerance. No nausea or vomiting Denies diarrhea. Denies local neck symptoms      HISTORY:  Past Medical History:  Past Medical History:  Diagnosis Date  . Asthma   . GERD (gastroesophageal reflux disease)   . Hypersomnia with sleep apnea   . Hypertension   . Migraine   . Rhinitis    Past Surgical History:  Past Surgical History:  Procedure Laterality Date  . CHOLECYSTECTOMY    . TOOTH EXTRACTION      Social History:  reports that she quit smoking about 21 years ago. Her smoking use included cigarettes. She has a 0.10 pack-year smoking history. She has never used smokeless tobacco. She reports current alcohol use. She reports that she does not use drugs. Family History:  Family History  Problem Relation Age of Onset  . Diabetes Father   . Early death Father   . Diabetes Mother   . COPD Mother   . Rheum arthritis Mother   . Hyperlipidemia Brother   . Thyroid  disease Brother   . Alcohol abuse Neg Hx   . Cancer Neg Hx   . Depression Neg Hx   . Drug abuse Neg Hx   . Hearing loss Neg Hx   . Heart disease Neg Hx   . Hypertension Neg Hx   . Stroke Neg Hx      HOME MEDICATIONS: Allergies as of 11/24/2020      Reactions   Codeine Hives, Nausea And Vomiting   Tramadol Diarrhea, Nausea And Vomiting      Medication List       Accurate as of November 24, 2020  7:23 AM. If you have any questions, ask your nurse or doctor.        acetaminophen 650 MG CR tablet Commonly known as: Tylenol 8 Hour Take 1 tablet (650 mg total) by mouth every 8 (eight) hours as needed for pain.   benzonatate 100 MG capsule Commonly known as: TESSALON Take 1 capsule (100 mg total) by mouth every 8 (eight) hours.   clotrimazole-betamethasone cream Commonly known as: Lotrisone Apply 1 application topically 2 (two) times daily. What changed:   when to take this  reasons to take this   diclofenac 75 MG EC tablet Commonly known as: VOLTAREN Take 1 tablet (75 mg total) by mouth 2 (two) times daily as needed for mild pain.   fluconazole 200 MG tablet Commonly known as: DIFLUCAN TAKE 1  TABLET (200 MG TOTAL) BY MOUTH EVERY 3 (THREE) DAYS.   fluticasone 50 MCG/ACT nasal spray Commonly known as: FLONASE Place 2 sprays into the nose daily.   indapamide 1.25 MG tablet Commonly known as: LOZOL TAKE 1 TABLET BY MOUTH EVERY DAY   levocetirizine 5 MG tablet Commonly known as: XYZAL TAKE 1 TABLET BY MOUTH EVERY DAY IN THE EVENING What changed:   how much to take  how to take this   lisdexamfetamine 30 MG capsule Commonly known as: Vyvanse Take 1 capsule (30 mg total) by mouth daily.   methimazole 10 MG tablet Commonly known as: TAPAZOLE Take 1 tablet (10 mg total) by mouth 2 (two) times daily.   montelukast 10 MG tablet Commonly known as: SINGULAIR TAKE 1 TABLET BY MOUTH EVERYDAY AT BEDTIME What changed: See the new instructions.   multivitamin  tablet Take 1 tablet by mouth daily.   nadolol 20 MG tablet Commonly known as: CORGARD TAKE 1 TABLET BY MOUTH EVERY DAY   norgestimate-ethinyl estradiol 0.25-35 MG-MCG tablet Commonly known as: ORTHO-CYCLEN Take 1 tablet by mouth at bedtime.   NovoFine 32G X 6 MM Misc Generic drug: Insulin Pen Needle 1 Act by Does not apply route once a week.   Nurtec 75 MG Tbdp Generic drug: Rimegepant Sulfate Take 1 tablet by mouth daily as needed. What changed: reasons to take this   omeprazole 40 MG capsule Commonly known as: PRILOSEC Take 40 mg by mouth daily.   ondansetron 4 MG disintegrating tablet Commonly known as: Zofran ODT Take 1 tablet (4 mg total) by mouth every 8 (eight) hours as needed for nausea or vomiting.   Ozempic (1 MG/DOSE) 4 MG/3ML Sopn Generic drug: Semaglutide (1 MG/DOSE) Inject 1 mg into the skin once a week. What changed: when to take this   potassium chloride SA 20 MEQ tablet Commonly known as: KLOR-CON Take 1 tablet (20 mEq total) by mouth daily.   solifenacin 10 MG tablet Commonly known as: VESIcare Take 1 tablet (10 mg total) by mouth daily.   Trintellix 10 MG Tabs tablet Generic drug: vortioxetine HBr Take 10 mg by mouth daily.         OBJECTIVE:   PHYSICAL EXAM: VS: There were no vitals taken for this visit.   EXAM: General: Pt appears well and is in NAD  Eyes: External eye exam normal without stare, lid lag or exophthalmos.  EOM intact.    Neck: General: Supple without adenopathy. Thyroid: Thyroid size normal.  No goiter or nodules appreciated. No thyroid bruit.  Lungs: Clear with good BS bilat with no rales, rhonchi, or wheezes  Heart: Auscultation: RRR.  Abdomen: Normoactive bowel sounds, soft, nontender, without masses or organomegaly palpable  Extremities: BL LE: No pretibial edema normal ROM and strength.  Neuro: Cranial nerves: II - XII grossly intact  Motor: Normal strength throughout DTRs: 2+ and symmetric in UE without delay  in relaxation phase  Mental Status: Judgment, insight: Intact Orientation: Oriented to time, place, and person Mood and affect: No depression, anxiety, or agitation     DATA REVIEWED:  Results for Alicia Hoover (MRN [id number]) as of 10/12/2019 15:41  Ref. Range 07/10/2019 09:23 10/12/2019 09:27  TSH Latest Ref Range: 0.35 - 4.50 uIU/mL <0.01 (L) <0.01 (L)  T4,Free(Direct) Latest Ref Range: 0.60 - 1.60 ng/dL 2.68 (H) 1.02     ASSESSMENT / PLAN / RECOMMENDATIONS:   1. Hyperthyroidism Secondary to Graves' Disease:   -  Clinically she is euthyroid  -  No local neck symptoms - FT4 is normal , TSH still low, will continue current dose of methimazole  - Tolerating methimazole without side effects.    Medications   Methimazole 10 mg , 2 tabs daily     2. Graves' Disease:    - No extrathyroidal manifestations of Grave's disease. Pt urged to notify her ophthalmologist about this diagnosis.   F/U in ***      Signed electronically by: Mack Guise, MD  [facility] Endocrinology  [facility] [address] [place], [place] [address] Phone: [phone] FAX: [phone]      CC: Alicia Lima, MD [place] [address] Phone: [phone]  Fax: [phone]   Return to Endocrinology clinic as below: Future Appointments  Date Time Provider [place]  11/24/2020  8:50 AM Terius Jacuinde, Melanie Crazier, MD [facility] None  02/14/2021  4:00 PM Alicia Lima, MD [facility] None

## 2020-11-24 ENCOUNTER — Ambulatory Visit: Payer: BC Managed Care – PPO | Admitting: Internal Medicine

## 2020-12-16 ENCOUNTER — Other Ambulatory Visit: Payer: Self-pay | Admitting: Internal Medicine

## 2020-12-18 ENCOUNTER — Other Ambulatory Visit: Payer: Self-pay | Admitting: Internal Medicine

## 2020-12-18 DIAGNOSIS — F5081 Binge eating disorder: Secondary | ICD-10-CM

## 2020-12-19 ENCOUNTER — Other Ambulatory Visit: Payer: Self-pay | Admitting: Internal Medicine

## 2020-12-19 MED ORDER — LISDEXAMFETAMINE DIMESYLATE 30 MG PO CAPS: 30.0000 mg | ORAL_CAPSULE | Freq: Every day | ORAL | 0 refills | Status: DC

## 2020-12-22 ENCOUNTER — Ambulatory Visit (INDEPENDENT_AMBULATORY_CARE_PROVIDER_SITE_OTHER): Payer: BC Managed Care – PPO | Admitting: Internal Medicine

## 2020-12-22 ENCOUNTER — Other Ambulatory Visit: Payer: Self-pay

## 2020-12-22 ENCOUNTER — Encounter: Payer: Self-pay | Admitting: Internal Medicine

## 2020-12-22 VITALS — BP 140/92 | HR 74 | Ht 65.0 in | Wt 300.4 lb

## 2020-12-22 DIAGNOSIS — E0590 Thyrotoxicosis, unspecified without thyrotoxic crisis or storm: Secondary | ICD-10-CM

## 2020-12-22 LAB — T4, FREE: Free T4: 0.75 ng/dL (ref 0.60–1.60)

## 2020-12-22 LAB — TSH: TSH: 1.22 u[IU]/mL (ref 0.35–4.50)

## 2020-12-22 MED ORDER — METHIMAZOLE 10 MG PO TABS: 10.0000 mg | ORAL_TABLET | Freq: Two times a day (BID) | ORAL | 1 refills | Status: DC

## 2020-12-22 NOTE — Progress Notes (Signed)
Name: Alicia Hoover  MRN/ DOB: [id number], 10/19/1975    Age/ Sex: 45 y.o., female     PCP: [name], MD   Reason for Endocrinology Evaluation: Hyperthyroidism     Initial Endocrinology Clinic Visit: 07/10/2019    PATIENT IDENTIFIER: Alicia Hoover is a 45 y.o., female with a past medical history of HTN, Obesity and Depression. She has followed with [facility] since 07/10/2019 for consultative assistance with management of her hyperthyroidism  HISTORICAL SUMMARY: The patient was first diagnosed with subclinical hyperthyroidism in 04/2019 with a TSh of 0.31 uIU/mL . She was started on Methimazole 04/2019 TRAb elevated at 14.60 IU/L  No Amiodarone  Was on Biotin until 05/2019  No radiation exposure.    Brother with thyroid disease   SUBJECTIVE:    Today (12/22/2020):  [name] is here for a follow up on hyperthyroidism secondary to Graves' disease.  She has not been to our clinic in a year.   Weight has been stable  Denies diarrhea.  Denies recent fever  Denies local neck symptoms   Denies eye symptoms    HISTORY:  Past Medical History:  Past Medical History:  Diagnosis Date  . Asthma   . GERD (gastroesophageal reflux disease)   . Hypersomnia with sleep apnea   . Hypertension   . Migraine   . Rhinitis    Past Surgical History:  Past Surgical History:  Procedure Laterality Date  . CHOLECYSTECTOMY    . TOOTH EXTRACTION      Social History:  reports that she quit smoking about 21 years ago. Her smoking use included cigarettes. She has a 0.10 pack-year smoking history. She has never used smokeless tobacco. She reports current alcohol use. She reports that she does not use drugs. Family History:  Family History  Problem Relation Age of Onset  . Diabetes Father   . Early death Father   . Diabetes Mother   . COPD Mother   . Rheum arthritis Mother   . Hyperlipidemia Brother   . Thyroid disease Brother   . Alcohol abuse Neg Hx    . Cancer Neg Hx   . Depression Neg Hx   . Drug abuse Neg Hx   . Hearing loss Neg Hx   . Heart disease Neg Hx   . Hypertension Neg Hx   . Stroke Neg Hx      HOME MEDICATIONS: Allergies as of 12/22/2020      Reactions   Codeine Hives, Nausea And Vomiting   Tramadol Diarrhea, Nausea And Vomiting      Medication List       Accurate as of December 22, 2020  7:56 AM. If you have any questions, ask your nurse or doctor.        acetaminophen 650 MG CR tablet Commonly known as: Tylenol 8 Hour Take 1 tablet (650 mg total) by mouth every 8 (eight) hours as needed for pain.   clotrimazole-betamethasone cream Commonly known as: Lotrisone Apply 1 application topically 2 (two) times daily. What changed:   when to take this  reasons to take this   diclofenac 75 MG EC tablet Commonly known as: VOLTAREN Take 1 tablet (75 mg total) by mouth 2 (two) times daily as needed for mild pain.   fluconazole 200 MG tablet Commonly known as: DIFLUCAN TAKE 1 TABLET (200 MG TOTAL) BY MOUTH EVERY 3 (THREE) DAYS.   fluticasone 50 MCG/ACT nasal spray Commonly known as: FLONASE Place 2 sprays into the  nose daily.   indapamide 1.25 MG tablet Commonly known as: LOZOL TAKE 1 TABLET BY MOUTH EVERY DAY   levocetirizine 5 MG tablet Commonly known as: XYZAL TAKE 1 TABLET BY MOUTH EVERY DAY IN THE EVENING What changed:   how much to take  how to take this   lisdexamfetamine 30 MG capsule Commonly known as: Vyvanse Take 1 capsule (30 mg total) by mouth daily.   methimazole 10 MG tablet Commonly known as: TAPAZOLE Take 1 tablet (10 mg total) by mouth 2 (two) times daily.   montelukast 10 MG tablet Commonly known as: SINGULAIR TAKE 1 TABLET BY MOUTH EVERYDAY AT BEDTIME What changed: See the new instructions.   multivitamin tablet Take 1 tablet by mouth daily.   nadolol 20 MG tablet Commonly known as: CORGARD TAKE 1 TABLET BY MOUTH EVERY DAY   norgestimate-ethinyl estradiol 0.25-35  MG-MCG tablet Commonly known as: ORTHO-CYCLEN Take 1 tablet by mouth at bedtime.   NovoFine 32G X 6 MM Misc Generic drug: Insulin Pen Needle 1 Act by Does not apply route once a week.   Nurtec 75 MG Tbdp Generic drug: Rimegepant Sulfate Take 1 tablet by mouth daily as needed. What changed: reasons to take this   omeprazole 40 MG capsule Commonly known as: PRILOSEC Take 40 mg by mouth daily.   Ozempic (1 MG/DOSE) 4 MG/3ML Sopn Generic drug: Semaglutide (1 MG/DOSE) Inject 1 mg into the skin once a week. What changed: when to take this   potassium chloride SA 20 MEQ tablet Commonly known as: KLOR-CON Take 1 tablet (20 mEq total) by mouth daily.   solifenacin 10 MG tablet Commonly known as: VESIcare Take 1 tablet (10 mg total) by mouth daily.   Trintellix 10 MG Tabs tablet Generic drug: vortioxetine HBr Take 10 mg by mouth daily.         OBJECTIVE:   PHYSICAL EXAM: VS: BP (!) 140/92   Pulse 74   Ht 5' 5"  (1.651 m)   Wt (!) 300 lb 6 oz (136.2 kg)   SpO2 97%   BMI 49.99 kg/m    EXAM: General: Pt appears well and is in NAD  Eyes: External eye exam normal without stare, lid lag or exophthalmos.  EOM intact.    Neck: General: Supple without adenopathy. Thyroid: Thyroid size normal.  No goiter or nodules appreciated. No thyroid bruit.  Lungs: Clear with good BS bilat with no rales, rhonchi, or wheezes  Heart: Auscultation: RRR.  Abdomen: Normoactive bowel sounds, soft, nontender, without masses or organomegaly palpable  Extremities: BL LE: No pretibial edema normal ROM and strength.  Neuro: Cranial nerves: II - XII grossly intact  Motor: Normal strength throughout DTRs: 2+ and symmetric in UE without delay in relaxation phase  Mental Status: Judgment, insight: Intact Orientation: Oriented to time, place, and person Mood and affect: No depression, anxiety, or agitation     DATA REVIEWED:  Results for ELDORA, NAPP (MRN [id number]) as of 12/22/2020 13:04   Ref. Range 12/22/2020 08:09  TSH Latest Ref Range: 0.35 - 4.50 uIU/mL 1.22  T4,Free(Direct) Latest Ref Range: 0.60 - 1.60 ng/dL 0.75     Results for REHMAT, MURTAGH (MRN [id number]) as of 12/22/2020 07:58  Ref. Range 10/03/2020 13:47  WBC Latest Ref Range: 4.0 - 10.5 K/uL 8.1  RBC Latest Ref Range: 3.87 - 5.11 MIL/uL 5.15 (H)  Hemoglobin Latest Ref Range: 12.0 - 15.0 g/dL 13.6  HCT Latest Ref Range: 36.0 - 46.0 % 42.7  MCV Latest  Ref Range: 80.0 - 100.0 fL 82.9  MCH Latest Ref Range: 26.0 - 34.0 pg 26.4  MCHC Latest Ref Range: 30.0 - 36.0 g/dL 31.9  RDW Latest Ref Range: 11.5 - 15.5 % 13.6  Platelets Latest Ref Range: 150 - 400 K/uL 339  nRBC Latest Ref Range: 0.0 - 0.2 % 0.0  Results for KALLEIGH, HARBOR (MRN [id number]) as of 12/22/2020 07:58  Ref. Range 10/03/2020 13:47  Sodium Latest Ref Range: 135 - 145 mmol/L 137  Potassium Latest Ref Range: 3.5 - 5.1 mmol/L 3.4 (L)  Chloride Latest Ref Range: 98 - 111 mmol/L 99  CO2 Latest Ref Range: 22 - 32 mmol/L 26  Glucose Latest Ref Range: 70 - 99 mg/dL 89  BUN Latest Ref Range: 6 - 20 mg/dL 13  Creatinine Latest Ref Range: 0.44 - 1.00 mg/dL 0.87  Calcium Latest Ref Range: 8.9 - 10.3 mg/dL 8.9  Anion gap Latest Ref Range: 5 - 15  12  GFR, Estimated Latest Ref Range: >60 mL/min >60    ASSESSMENT / PLAN / RECOMMENDATIONS:   1. Hyperthyroidism Secondary to Graves' Disease:   -  Clinically she is euthyroid  - No local neck symptoms - TFT's are normal,  will continue current dose of methimazole  - Tolerating methimazole without side effects.    Medications   Methimazole 10 mg , 2 tabs daily     2. Graves' Disease:    - No extrathyroidal manifestations of Grave's disease.        F/U in 4 months       Signed electronically by: Mack Guise, MD  [facility] Endocrinology  [facility] [address] [place], [place] [address] Phone: [phone] FAX: [phone]       CC: [name], MD [place] [address] Phone: [phone]  Fax: [phone]   Return to Endocrinology clinic as below: Future Appointments  Date Time Provider [place]  02/14/2021  4:00 PM [name], MD [facility] None

## 2021-01-25 ENCOUNTER — Other Ambulatory Visit: Payer: Self-pay | Admitting: Internal Medicine

## 2021-01-25 DIAGNOSIS — F5081 Binge eating disorder: Secondary | ICD-10-CM

## 2021-01-25 DIAGNOSIS — I10 Essential (primary) hypertension: Secondary | ICD-10-CM

## 2021-01-26 ENCOUNTER — Other Ambulatory Visit: Payer: Self-pay | Admitting: Internal Medicine

## 2021-01-26 DIAGNOSIS — F5081 Binge eating disorder: Secondary | ICD-10-CM

## 2021-01-26 MED ORDER — LISDEXAMFETAMINE DIMESYLATE 40 MG PO CAPS: 40.0000 mg | ORAL_CAPSULE | ORAL | 0 refills | Status: DC

## 2021-01-29 ENCOUNTER — Other Ambulatory Visit: Payer: Self-pay | Admitting: Internal Medicine

## 2021-01-29 DIAGNOSIS — L239 Allergic contact dermatitis, unspecified cause: Secondary | ICD-10-CM

## 2021-02-07 ENCOUNTER — Other Ambulatory Visit: Payer: Self-pay | Admitting: Internal Medicine

## 2021-02-07 DIAGNOSIS — I10 Essential (primary) hypertension: Secondary | ICD-10-CM

## 2021-02-07 DIAGNOSIS — T502X5A Adverse effect of carbonic-anhydrase inhibitors, benzothiadiazides and other diuretics, initial encounter: Secondary | ICD-10-CM

## 2021-02-07 DIAGNOSIS — E876 Hypokalemia: Secondary | ICD-10-CM

## 2021-02-14 ENCOUNTER — Other Ambulatory Visit: Payer: Self-pay

## 2021-02-14 ENCOUNTER — Encounter: Payer: Self-pay | Admitting: Internal Medicine

## 2021-02-14 ENCOUNTER — Ambulatory Visit: Payer: BC Managed Care – PPO | Admitting: Internal Medicine

## 2021-02-14 VITALS — BP 118/76 | Temp 98.4°F | Ht 65.0 in | Wt 306.0 lb

## 2021-02-14 DIAGNOSIS — R7303 Prediabetes: Secondary | ICD-10-CM | POA: Diagnosis not present

## 2021-02-14 DIAGNOSIS — E876 Hypokalemia: Secondary | ICD-10-CM

## 2021-02-14 DIAGNOSIS — N3281 Overactive bladder: Secondary | ICD-10-CM

## 2021-02-14 DIAGNOSIS — I10 Essential (primary) hypertension: Secondary | ICD-10-CM | POA: Diagnosis not present

## 2021-02-14 DIAGNOSIS — M170 Bilateral primary osteoarthritis of knee: Secondary | ICD-10-CM

## 2021-02-14 DIAGNOSIS — F5081 Binge eating disorder: Secondary | ICD-10-CM | POA: Diagnosis not present

## 2021-02-14 DIAGNOSIS — G43009 Migraine without aura, not intractable, without status migrainosus: Secondary | ICD-10-CM | POA: Diagnosis not present

## 2021-02-14 DIAGNOSIS — T502X5A Adverse effect of carbonic-anhydrase inhibitors, benzothiadiazides and other diuretics, initial encounter: Secondary | ICD-10-CM

## 2021-02-14 MED ORDER — NURTEC 75 MG PO TBDP: 1.0000 | ORAL_TABLET | Freq: Every day | ORAL | 3 refills | Status: DC | PRN

## 2021-02-14 MED ORDER — SOLIFENACIN SUCCINATE 10 MG PO TABS
10.0000 mg | ORAL_TABLET | Freq: Every day | ORAL | 1 refills | Status: DC
Start: 2021-02-14 — End: 2021-08-22

## 2021-02-14 MED ORDER — LISDEXAMFETAMINE DIMESYLATE 40 MG PO CAPS
40.0000 mg | ORAL_CAPSULE | ORAL | 0 refills | Status: DC
Start: 2021-02-14 — End: 2021-04-25

## 2021-02-14 MED ORDER — DICLOFENAC SODIUM 75 MG PO TBEC: 75.0000 mg | DELAYED_RELEASE_TABLET | Freq: Two times a day (BID) | ORAL | 0 refills | Status: DC | PRN

## 2021-02-14 NOTE — Patient Instructions (Signed)

## 2021-02-14 NOTE — Progress Notes (Signed)
Subjective:  Patient ID: Alicia Hoover, female    DOB: 1976/01/23  Age: 45 y.o. MRN: [id number]  CC: Hypertension  This visit occurred during the SARS-CoV-2 public health emergency.  Safety protocols were in place, including screening questions prior to the visit, additional usage of staff PPE, and extensive cleaning of exam room while observing appropriate contact time as indicated for disinfecting solutions.    HPI Alicia Hoover presents for f/up -   She complains of weight gain.  She is not taking Vyvanse.  She thinks her blood pressure is well controlled.  She denies dizziness, lightheadedness, palpitations, or edema.  Outpatient Medications Prior to Visit  Medication Sig Dispense Refill  . acetaminophen (TYLENOL 8 HOUR) 650 MG CR tablet Take 1 tablet (650 mg total) by mouth every 8 (eight) hours as needed for pain. 15 tablet 0  . clotrimazole-betamethasone (LOTRISONE) cream Apply 1 application topically 2 (two) times daily. (Patient taking differently: Apply 1 application topically 2 (two) times daily as needed (rash).) 45 g 0  . fluconazole (DIFLUCAN) 200 MG tablet TAKE 1 TABLET (200 MG TOTAL) BY MOUTH EVERY 3 (THREE) DAYS. 3 tablet 2  . fluticasone (FLONASE) 50 MCG/ACT nasal spray Place 2 sprays into the nose daily.    Marland Kitchen levocetirizine (XYZAL) 5 MG tablet TAKE 1 TABLET BY MOUTH EVERY DAY IN THE EVENING (Patient taking differently: Take 5 mg by mouth every evening.) 90 tablet 1  . methimazole (TAPAZOLE) 10 MG tablet Take 1 tablet (10 mg total) by mouth 2 (two) times daily. 180 tablet 1  . montelukast (SINGULAIR) 10 MG tablet TAKE 1 TABLET BY MOUTH EVERYDAY AT BEDTIME (Patient taking differently: Take 10 mg by mouth at bedtime.) 90 tablet 1  . Multiple Vitamin (MULTIVITAMIN) tablet Take 1 tablet by mouth daily.    . nadolol (CORGARD) 20 MG tablet TAKE 1 TABLET BY MOUTH EVERY DAY 90 tablet 3  . norgestimate-ethinyl estradiol (ORTHO-CYCLEN) 0.25-35 MG-MCG tablet Take 1 tablet by  mouth at bedtime.    Marland Kitchen omeprazole (PRILOSEC) 40 MG capsule Take 40 mg by mouth daily.    . Semaglutide, 1 MG/DOSE, (OZEMPIC, 1 MG/DOSE,) 4 MG/3ML SOPN Inject 1 mg into the skin once a week. (Patient taking differently: Inject 1 mg into the skin every Monday.) 9 mL 1  . TRINTELLIX 10 MG TABS tablet Take 10 mg by mouth daily.    . diclofenac (VOLTAREN) 75 MG EC tablet Take 1 tablet (75 mg total) by mouth 2 (two) times daily as needed for mild pain. 180 tablet 0  . indapamide (LOZOL) 1.25 MG tablet TAKE 1 TABLET BY MOUTH EVERY DAY 90 tablet 0  . KLOR-CON M20 20 MEQ tablet TAKE 1 TABLET BY MOUTH EVERY DAY 90 tablet 1  . lisdexamfetamine (VYVANSE) 40 MG capsule Take 1 capsule (40 mg total) by mouth every morning. 30 capsule 0  . Rimegepant Sulfate (NURTEC) 75 MG TBDP Take 1 tablet by mouth daily as needed. (Patient taking differently: Take 1 tablet by mouth daily as needed (migraine headache).) 8 tablet 0  . solifenacin (VESICARE) 10 MG tablet Take 1 tablet (10 mg total) by mouth daily. 90 tablet 1  . Insulin Pen Needle (NOVOFINE) 32G X 6 MM MISC 1 Act by Does not apply route once a week. 50 each 0   No facility-administered medications prior to visit.    ROS Review of Systems  Constitutional: Positive for unexpected weight change. Negative for diaphoresis and fatigue.  HENT: Negative.   Eyes:  Negative.   Respiratory: Negative.  Negative for shortness of breath and wheezing.   Cardiovascular: Negative for chest pain, palpitations and leg swelling.  Gastrointestinal: Negative for abdominal pain, constipation, diarrhea, nausea and vomiting.  Endocrine: Positive for polyuria.  Genitourinary: Positive for frequency. Negative for difficulty urinating.  Musculoskeletal: Positive for arthralgias. Negative for back pain and myalgias.  Skin: Negative.  Negative for color change.  Neurological: Positive for headaches. Negative for dizziness and weakness.       She has rare headaches that are well  controlled with Nurtec.  Hematological: Negative for adenopathy. Does not bruise/bleed easily.  Psychiatric/Behavioral: Negative.     Objective:  BP 118/76 (BP Location: Left Arm, Patient Position: Sitting)   Temp 98.4 F (36.9 C) (Oral)   Ht 5' 5"  (1.651 m)   Wt (!) 306 lb (138.8 kg)   BMI 50.92 kg/m   BP Readings from Last 3 Encounters:  02/14/21 118/76  12/22/20 (!) 140/92  10/03/20 114/67    Wt Readings from Last 3 Encounters:  02/14/21 (!) 306 lb (138.8 kg)  12/22/20 (!) 300 lb 6 oz (136.2 kg)  10/03/20 300 lb (136.1 kg)    Physical Exam Vitals reviewed.  Constitutional:      Appearance: She is obese.  HENT:     Mouth/Throat:     Mouth: Mucous membranes are moist.  Eyes:     General: No scleral icterus.    Conjunctiva/sclera: Conjunctivae normal.  Cardiovascular:     Rate and Rhythm: Normal rate and regular rhythm.     Heart sounds: No murmur heard.   Pulmonary:     Effort: Pulmonary effort is normal.     Breath sounds: No stridor. No wheezing, rhonchi or rales.  Abdominal:     General: Abdomen is protuberant. Bowel sounds are normal. There is no distension.     Palpations: Abdomen is soft. There is no hepatomegaly, splenomegaly or mass.     Tenderness: There is no abdominal tenderness.  Musculoskeletal:        General: Normal range of motion.     Cervical back: Neck supple.     Right lower leg: No edema.     Left lower leg: No edema.  Lymphadenopathy:     Cervical: No cervical adenopathy.  Skin:    General: Skin is warm and dry.  Neurological:     General: No focal deficit present.     Mental Status: She is alert.  Psychiatric:        Mood and Affect: Mood normal.        Behavior: Behavior normal.     Lab Results  Component Value Date   WBC 8.1 10/03/2020   HGB 13.6 10/03/2020   HCT 42.7 10/03/2020   PLT 339 10/03/2020   GLUCOSE 73 02/14/2021   CHOL 148 04/12/2020   TRIG 147.0 04/12/2020   HDL 39.90 04/12/2020   LDLDIRECT 57.0  03/27/2017   LDLCALC 79 04/12/2020   ALT 19 04/12/2020   AST 17 04/12/2020   NA 139 02/14/2021   K 3.2 (L) 02/14/2021   CL 101 02/14/2021   CREATININE 0.89 02/14/2021   BUN 13 02/14/2021   CO2 31 02/14/2021   TSH 1.22 12/22/2020   HGBA1C 5.9 02/14/2021    DG Chest Portable 1 View  Result Date: 10/03/2020 CLINICAL DATA:  Chest pain, COVID positive EXAM: PORTABLE CHEST 1 VIEW COMPARISON:  04/29/2004 chest radiograph. FINDINGS: Stable cardiomediastinal silhouette with normal heart size. No pneumothorax. No pleural effusion. Lungs  appear clear, with no acute consolidative airspace disease and no pulmonary edema. IMPRESSION: No active disease. Electronically Signed   By: Ilona Sorrel M.D.   On: 10/03/2020 14:25    Assessment & Plan:   Taler was seen today for hypertension.  Diagnoses and all orders for this visit:  Essential hypertension, benign- Her blood pressure is overcontrolled and she has hypokalemia.  I recommended that she stop taking indapamide and to start taking spironolactone.  I have also encouraged her to be more compliant with the potassium supplement. -     Basic metabolic panel; Future -     Magnesium; Future -     Magnesium -     Basic metabolic panel -     potassium chloride SA (KLOR-CON M20) 20 MEQ tablet; Take 1 tablet (20 mEq total) by mouth 2 (two) times daily. -     spironolactone (ALDACTONE) 25 MG tablet; Take 1 tablet (25 mg total) by mouth daily.  Binge-eating disorder, moderate -     lisdexamfetamine (VYVANSE) 40 MG capsule; Take 1 capsule (40 mg total) by mouth every morning.  Migraine without aura and without status migrainosus, not intractable -     Rimegepant Sulfate (NURTEC) 75 MG TBDP; Take 1 tablet by mouth daily as needed.  OAB (overactive bladder) -     solifenacin (VESICARE) 10 MG tablet; Take 1 tablet (10 mg total) by mouth daily.  Primary osteoarthritis of both knees -     diclofenac (VOLTAREN) 75 MG EC tablet; Take 1 tablet (75 mg  total) by mouth 2 (two) times daily as needed for mild pain.  Chronic hypokalemia -     Basic metabolic panel; Future -     Magnesium; Future -     Magnesium -     Basic metabolic panel  Prediabetes -     Hemoglobin A1c; Future -     Hemoglobin A1c  Diuretic-induced hypokalemia -     potassium chloride SA (KLOR-CON M20) 20 MEQ tablet; Take 1 tablet (20 mEq total) by mouth 2 (two) times daily. -     spironolactone (ALDACTONE) 25 MG tablet; Take 1 tablet (25 mg total) by mouth daily.   I have discontinued Shawneen L. Allbright's NovoFine and indapamide. I have also changed her Klor-Con M20 to potassium chloride SA. Additionally, I am having her start on spironolactone. Lastly, I am having her maintain her fluticasone, omeprazole, multivitamin, levocetirizine, Ozempic (1 MG/DOSE), clotrimazole-betamethasone, montelukast, Trintellix, norgestimate-ethinyl estradiol, acetaminophen, fluconazole, nadolol, methimazole, diclofenac, lisdexamfetamine, Nurtec, and solifenacin.  Meds ordered this encounter  Medications  . diclofenac (VOLTAREN) 75 MG EC tablet    Sig: Take 1 tablet (75 mg total) by mouth 2 (two) times daily as needed for mild pain.    Dispense:  180 tablet    Refill:  0  . lisdexamfetamine (VYVANSE) 40 MG capsule    Sig: Take 1 capsule (40 mg total) by mouth every morning.    Dispense:  30 capsule    Refill:  0  . Rimegepant Sulfate (NURTEC) 75 MG TBDP    Sig: Take 1 tablet by mouth daily as needed.    Dispense:  8 tablet    Refill:  3  . solifenacin (VESICARE) 10 MG tablet    Sig: Take 1 tablet (10 mg total) by mouth daily.    Dispense:  90 tablet    Refill:  1  . potassium chloride SA (KLOR-CON M20) 20 MEQ tablet    Sig: Take 1 tablet (20 mEq  total) by mouth 2 (two) times daily.    Dispense:  180 tablet    Refill:  1  . spironolactone (ALDACTONE) 25 MG tablet    Sig: Take 1 tablet (25 mg total) by mouth daily.    Dispense:  90 tablet    Refill:  0     Follow-up: Return  in about 6 months (around 08/17/2021).  Scarlette Calico, MD

## 2021-02-15 LAB — BASIC METABOLIC PANEL
BUN: 13 mg/dL (ref 6–23)
CO2: 31 mEq/L (ref 19–32)
Calcium: 8.6 mg/dL (ref 8.4–10.5)
Chloride: 101 mEq/L (ref 96–112)
Creatinine, Ser: 0.89 mg/dL (ref 0.40–1.20)
GFR: 77.98 mL/min (ref >60.00)
Glucose, Bld: 73 mg/dL (ref 70–99)
Potassium: 3.2 mEq/L — ABNORMAL LOW (ref 3.5–5.1)
Sodium: 139 mEq/L (ref 135–145)

## 2021-02-15 LAB — HEMOGLOBIN A1C: Hgb A1c MFr Bld: 5.9 % (ref 4.6–6.5)

## 2021-02-15 LAB — MAGNESIUM: Magnesium: 2 mg/dL (ref 1.5–2.5)

## 2021-02-15 MED ORDER — SPIRONOLACTONE 25 MG PO TABS: 25.0000 mg | ORAL_TABLET | Freq: Every day | ORAL | 0 refills | Status: DC

## 2021-02-15 MED ORDER — POTASSIUM CHLORIDE CRYS ER 20 MEQ PO TBCR: 20.0000 meq | EXTENDED_RELEASE_TABLET | Freq: Two times a day (BID) | ORAL | 1 refills | Status: DC

## 2021-02-17 ENCOUNTER — Telehealth: Payer: Self-pay

## 2021-02-17 NOTE — Telephone Encounter (Signed)
Approved 02/17/2021 - 02/18/2024

## 2021-02-17 NOTE — Telephone Encounter (Signed)
Key: [id number]

## 2021-02-27 ENCOUNTER — Encounter: Payer: Self-pay | Admitting: Internal Medicine

## 2021-02-28 ENCOUNTER — Other Ambulatory Visit: Payer: Self-pay | Admitting: Internal Medicine

## 2021-02-28 DIAGNOSIS — J301 Allergic rhinitis due to pollen: Secondary | ICD-10-CM

## 2021-02-28 DIAGNOSIS — L239 Allergic contact dermatitis, unspecified cause: Secondary | ICD-10-CM

## 2021-02-28 MED ORDER — LEVOCETIRIZINE DIHYDROCHLORIDE 5 MG PO TABS
5.0000 mg | ORAL_TABLET | Freq: Every evening | ORAL | 1 refills | Status: DC
Start: 2021-02-28 — End: 2021-08-22

## 2021-02-28 MED ORDER — MONTELUKAST SODIUM 10 MG PO TABS: 10.0000 mg | ORAL_TABLET | Freq: Every day | ORAL | 1 refills | Status: DC

## 2021-03-19 ENCOUNTER — Other Ambulatory Visit: Payer: Self-pay | Admitting: Internal Medicine

## 2021-03-19 DIAGNOSIS — R7303 Prediabetes: Secondary | ICD-10-CM

## 2021-04-14 ENCOUNTER — Other Ambulatory Visit: Payer: Self-pay | Admitting: Internal Medicine

## 2021-04-14 ENCOUNTER — Encounter: Payer: Self-pay | Admitting: Internal Medicine

## 2021-04-14 ENCOUNTER — Other Ambulatory Visit: Payer: Self-pay | Admitting: Obstetrics

## 2021-04-14 DIAGNOSIS — K2100 Gastro-esophageal reflux disease with esophagitis, without bleeding: Secondary | ICD-10-CM

## 2021-04-14 DIAGNOSIS — K219 Gastro-esophageal reflux disease without esophagitis: Secondary | ICD-10-CM | POA: Insufficient documentation

## 2021-04-14 MED ORDER — DEXLANSOPRAZOLE 60 MG PO CPDR: 60.0000 mg | DELAYED_RELEASE_CAPSULE | Freq: Every day | ORAL | 1 refills | Status: DC

## 2021-04-25 ENCOUNTER — Other Ambulatory Visit: Payer: Self-pay | Admitting: Internal Medicine

## 2021-04-25 DIAGNOSIS — F5081 Binge eating disorder: Secondary | ICD-10-CM

## 2021-04-26 MED ORDER — LISDEXAMFETAMINE DIMESYLATE 40 MG PO CAPS: 40.0000 mg | ORAL_CAPSULE | ORAL | 0 refills | Status: DC

## 2021-04-28 ENCOUNTER — Encounter: Payer: Self-pay | Admitting: Internal Medicine

## 2021-04-28 ENCOUNTER — Other Ambulatory Visit: Payer: Self-pay

## 2021-04-28 ENCOUNTER — Other Ambulatory Visit (INDEPENDENT_AMBULATORY_CARE_PROVIDER_SITE_OTHER): Payer: BC Managed Care – PPO

## 2021-04-28 ENCOUNTER — Ambulatory Visit: Payer: BC Managed Care – PPO | Admitting: Internal Medicine

## 2021-04-28 VITALS — BP 122/78 | HR 80 | Ht 65.0 in | Wt 292.0 lb

## 2021-04-28 DIAGNOSIS — E876 Hypokalemia: Secondary | ICD-10-CM

## 2021-04-28 DIAGNOSIS — E0500 Thyrotoxicosis with diffuse goiter without thyrotoxic crisis or storm: Secondary | ICD-10-CM

## 2021-04-28 DIAGNOSIS — E0590 Thyrotoxicosis, unspecified without thyrotoxic crisis or storm: Secondary | ICD-10-CM

## 2021-04-28 LAB — TSH: TSH: 6.54 u[IU]/mL — ABNORMAL HIGH (ref 0.35–5.50)

## 2021-04-28 LAB — POTASSIUM: Potassium: 4.2 mEq/L (ref 3.5–5.1)

## 2021-04-28 LAB — T4, FREE: Free T4: 0.84 ng/dL (ref 0.60–1.60)

## 2021-04-28 MED ORDER — METHIMAZOLE 10 MG PO TABS: 15.0000 mg | ORAL_TABLET | Freq: Every day | ORAL | 1 refills | Status: DC

## 2021-04-28 NOTE — Patient Instructions (Signed)

## 2021-04-28 NOTE — Progress Notes (Signed)
Name: Alicia Hoover  MRN/ DOB: [id number], [birth year]    Age/ Sex: 45 y.o., female     PCP: [name], MD   Reason for Endocrinology Evaluation: Hyperthyroidism     Initial Endocrinology Clinic Visit: 07/10/2019    PATIENT IDENTIFIER: Alicia Hoover is a 45 y.o., female with a past medical history of HTN, Obesity and Depression. She has followed with [facility] since 07/10/2019 for consultative assistance with management of her hyperthyroidism  HISTORICAL SUMMARY: The patient was first diagnosed with subclinical hyperthyroidism in 04/2019 with a TSh of 0.31 uIU/mL . She was started on Methimazole 04/2019 TRAb elevated at 14.60 IU/L  No Amiodarone  Was on Biotin until 05/2019   No radiation exposure.   Brother with thyroid disease     HYPOKALEMIA HISTORY: Pt has been noted with persistent and spontaneous hypokalemia since 08/2020 despite being on spironolactone. She was also noted with pre-diabetes with an A1c of 5.9 % in 02/2021 .  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    Has been noted with weight loss that she attributes to [place] with methimazole  Denies diarrhea.  Denies recent fever  Denies local neck symptoms   Denies eye symptoms    HISTORY:  Past Medical History:  Past Medical History:  Diagnosis Date   Asthma    GERD (gastroesophageal reflux disease)    Hypersomnia with sleep apnea    Hypertension    Migraine    Rhinitis    Past Surgical History:  Past Surgical History:  Procedure Laterality Date   CHOLECYSTECTOMY     TOOTH EXTRACTION     Social History:  reports that she quit smoking about 21 years ago. Her smoking use included cigarettes. She has a 0.10 pack-year smoking history. She has never used smokeless tobacco. She reports  current alcohol use. She reports that she does not use drugs. Family History:  Family History  Problem Relation Age of Onset   Diabetes Father    Early death Father    Diabetes Mother    COPD Mother    Rheum arthritis Mother    Hyperlipidemia Brother    Thyroid disease Brother    Alcohol abuse Neg Hx    Cancer Neg Hx    Depression Neg Hx    Drug abuse Neg Hx    Hearing loss Neg Hx    Heart disease Neg Hx    Hypertension Neg Hx    Stroke Neg Hx      HOME MEDICATIONS: Allergies as of 04/28/2021       Reactions   Codeine Hives, Nausea And Vomiting   Tramadol Diarrhea, Nausea And Vomiting        Medication List        Accurate as of April 28, 2021  8:32 AM. If you have any questions, ask your nurse or doctor.          acetaminophen 650 MG CR tablet Commonly known as: Tylenol 8 Hour Take 1 tablet (650 mg total) by mouth every 8 (eight) hours as needed for pain.   clotrimazole-betamethasone cream Commonly known as: Lotrisone Apply 1 application topically 2 (two) times daily. What changed:  when to take this reasons to take this   diclofenac 75 MG EC tablet Commonly known  as: VOLTAREN Take 1 tablet (75 mg total) by mouth 2 (two) times daily as needed for mild pain.   fluconazole 200 MG tablet Commonly known as: DIFLUCAN TAKE 1 TABLET (200 MG TOTAL) BY MOUTH EVERY 3 (THREE) DAYS.   fluticasone 50 MCG/ACT nasal spray Commonly known as: FLONASE Place 2 sprays into the nose daily.   levocetirizine 5 MG tablet Commonly known as: XYZAL Take 1 tablet (5 mg total) by mouth every evening.   lisdexamfetamine 40 MG capsule Commonly known as: Vyvanse Take 1 capsule (40 mg total) by mouth every morning.   methimazole 10 MG tablet Commonly known as: TAPAZOLE Take 1 tablet (10 mg total) by mouth 2 (two) times daily.   montelukast 10 MG tablet Commonly known as: SINGULAIR Take 1 tablet (10 mg total) by mouth at bedtime.   multivitamin tablet Take 1 tablet by  mouth daily.   nadolol 20 MG tablet Commonly known as: CORGARD TAKE 1 TABLET BY MOUTH EVERY DAY   norgestimate-ethinyl estradiol 0.25-35 MG-MCG tablet Commonly known as: ORTHO-CYCLEN Take 1 tablet by mouth at bedtime.   Nurtec 75 MG Tbdp Generic drug: Rimegepant Sulfate Take 1 tablet by mouth daily as needed.   Ozempic (1 MG/DOSE) 4 MG/3ML Sopn Generic drug: Semaglutide (1 MG/DOSE) INJECT 1 MG INTO THE SKIN ONCE A WEEK.   pantoprazole 40 MG tablet Commonly known as: PROTONIX Take 1 tablet (40 mg total) by mouth 2 (two) times daily.   potassium chloride SA 20 MEQ tablet Commonly known as: Klor-Con M20 Take 1 tablet (20 mEq total) by mouth 2 (two) times daily.   solifenacin 10 MG tablet Commonly known as: VESIcare Take 1 tablet (10 mg total) by mouth daily.   spironolactone 25 MG tablet Commonly known as: ALDACTONE Take 1 tablet (25 mg total) by mouth daily.   Trintellix 10 MG Tabs tablet Generic drug: vortioxetine HBr Take 10 mg by mouth daily.          OBJECTIVE:   PHYSICAL EXAM: VS: BP 122/78   Pulse 80   Ht 5' 5"  (1.651 m)   Wt 292 lb (132.5 kg)   SpO2 95%   BMI 48.59 kg/m    EXAM: General: Pt appears well and is in NAD  Eyes: External eye exam normal without stare, lid lag or exophthalmos.  EOM intact.    Neck: General: Supple without adenopathy. Thyroid: Thyroid size normal.  No goiter or nodules appreciated. No thyroid bruit.  Lungs: Clear with good BS bilat with no rales, rhonchi, or wheezes  Heart: Auscultation: RRR.  Extremities: BL LE: No pretibial edema normal ROM and strength.  Neuro: Cranial nerves: II - XII grossly intact  Motor: Normal strength throughout DTRs: 2+ and symmetric in UE without delay in relaxation phase  Mental Status: Judgment, insight: Intact Orientation: Oriented to time, place, and person Mood and affect: No depression, anxiety, or agitation     DATA REVIEWED: Results for Alicia Hoover, Alicia Hoover (MRN [id number]) as of  04/28/2021 17:12  Ref. Range 04/28/2021 09:14  Potassium Latest Ref Range: 3.5 - 5.1 mEq/L 4.2  TSH Latest Ref Range: 0.35 - 5.50 uIU/mL 6.54 (H)  T4,Free(Direct) Latest Ref Range: 0.60 - 1.60 ng/dL 0.84    Results for Alicia Hoover, Alicia Hoover (MRN [id number]) as of 12/22/2020 07:58  Ref. Range 10/03/2020 13:47  WBC Latest Ref Range: 4.0 - 10.5 K/uL 8.1  RBC Latest Ref Range: 3.87 - 5.11 MIL/uL 5.15 (H)  Hemoglobin Latest Ref Range: 12.0 - 15.0 g/dL 13.6  HCT Latest Ref Range: 36.0 - 46.0 % 42.7  MCV Latest Ref Range: 80.0 - 100.0 fL 82.9  MCH Latest Ref Range: 26.0 - 34.0 pg 26.4  MCHC Latest Ref Range: 30.0 - 36.0 g/dL 31.9  RDW Latest Ref Range: 11.5 - 15.5 % 13.6  Platelets Latest Ref Range: 150 - 400 K/uL 339  nRBC Latest Ref Range: 0.0 - 0.2 % 0.0  Results for Alicia Hoover, Alicia Hoover (MRN [id number]) as of 12/22/2020 07:58  Ref. Range 10/03/2020 13:47  Sodium Latest Ref Range: 135 - 145 mmol/L 137  Potassium Latest Ref Range: 3.5 - 5.1 mmol/L 3.4 (L)  Chloride Latest Ref Range: 98 - 111 mmol/L 99  CO2 Latest Ref Range: 22 - 32 mmol/L 26  Glucose Latest Ref Range: 70 - 99 mg/dL 89  BUN Latest Ref Range: 6 - 20 mg/dL 13  Creatinine Latest Ref Range: 0.44 - 1.00 mg/dL 0.87  Calcium Latest Ref Range: 8.9 - 10.3 mg/dL 8.9  Anion gap Latest Ref Range: 5 - 15  12  GFR, Estimated Latest Ref Range: >60 mL/min >60    ASSESSMENT / PLAN / RECOMMENDATIONS:   Hyperthyroidism Secondary to Graves' Disease:   - Clinically she is euthyroid  - No local neck symptoms -TSH is elevated, will reduce methimazole as below   Medications   Decrease methimazole 10 mg , 1.5 tabs daily     2. Graves' Disease:    - No extrathyroidal manifestations of Grave's disease.    3. Spontaneous Hypokalemia :  - Pt on Spironolactone  - Will proceed with aldo: renin and 24-hr urinary cortisol      F/U in 4 months       Signed electronically by: Mack Guise, MD  [facility]   [facility] [address], [place] [address] Phone: [phone] FAX: [phone]      CC: [name], MD [place] [address] Phone: [phone]  Fax: [phone]   Return to Endocrinology clinic as below: No future appointments.

## 2021-04-29 ENCOUNTER — Other Ambulatory Visit: Payer: Self-pay | Admitting: Internal Medicine

## 2021-04-29 DIAGNOSIS — I10 Essential (primary) hypertension: Secondary | ICD-10-CM

## 2021-04-29 DIAGNOSIS — F5081 Binge eating disorder: Secondary | ICD-10-CM

## 2021-04-29 DIAGNOSIS — E876 Hypokalemia: Secondary | ICD-10-CM

## 2021-05-01 ENCOUNTER — Other Ambulatory Visit: Payer: Self-pay | Admitting: Internal Medicine

## 2021-05-01 DIAGNOSIS — Z1231 Encounter for screening mammogram for malignant neoplasm of breast: Secondary | ICD-10-CM

## 2021-05-01 MED ORDER — LISDEXAMFETAMINE DIMESYLATE 40 MG PO CAPS: 40.0000 mg | ORAL_CAPSULE | ORAL | 0 refills | Status: DC

## 2021-05-01 MED ORDER — SPIRONOLACTONE 25 MG PO TABS: 25.0000 mg | ORAL_TABLET | Freq: Every day | ORAL | 0 refills | Status: DC

## 2021-05-05 ENCOUNTER — Ambulatory Visit
Admission: RE | Admit: 2021-05-05 | Discharge: 2021-05-05 | Disposition: A | Discharge status: Home / Self Care | Payer: BC Managed Care – PPO | Source: Ambulatory Visit

## 2021-05-05 ENCOUNTER — Other Ambulatory Visit: Payer: Self-pay

## 2021-05-05 DIAGNOSIS — Z1231 Encounter for screening mammogram for malignant neoplasm of breast: Secondary | ICD-10-CM

## 2021-05-05 LAB — ALDOSTERONE + RENIN ACTIVITY W/ RATIO
ALDOS/RENIN RATIO: 4.6 (ref 0.0–30.0)
ALDOSTERONE: 10 ng/dL (ref 0.0–30.0)
Renin: 2.172 ng/mL/hr (ref 0.167–5.380)

## 2021-05-26 ENCOUNTER — Other Ambulatory Visit: Payer: Self-pay | Admitting: Internal Medicine

## 2021-05-26 DIAGNOSIS — I10 Essential (primary) hypertension: Secondary | ICD-10-CM

## 2021-05-28 ENCOUNTER — Other Ambulatory Visit: Payer: Self-pay | Admitting: Internal Medicine

## 2021-05-28 DIAGNOSIS — I10 Essential (primary) hypertension: Secondary | ICD-10-CM

## 2021-06-14 ENCOUNTER — Other Ambulatory Visit: Payer: Self-pay | Admitting: Internal Medicine

## 2021-06-16 ENCOUNTER — Other Ambulatory Visit: Payer: Self-pay | Admitting: Internal Medicine

## 2021-06-16 ENCOUNTER — Other Ambulatory Visit: Payer: Self-pay | Admitting: Obstetrics

## 2021-06-16 DIAGNOSIS — L239 Allergic contact dermatitis, unspecified cause: Secondary | ICD-10-CM

## 2021-06-16 DIAGNOSIS — I10 Essential (primary) hypertension: Secondary | ICD-10-CM

## 2021-06-28 ENCOUNTER — Other Ambulatory Visit: Payer: Self-pay | Admitting: Internal Medicine

## 2021-06-28 DIAGNOSIS — F5081 Binge eating disorder: Secondary | ICD-10-CM

## 2021-06-28 MED ORDER — LISDEXAMFETAMINE DIMESYLATE 40 MG PO CAPS: 40.0000 mg | ORAL_CAPSULE | ORAL | 0 refills | Status: DC

## 2021-07-11 ENCOUNTER — Encounter: Payer: Self-pay | Admitting: Internal Medicine

## 2021-07-11 DIAGNOSIS — R21 Rash and other nonspecific skin eruption: Secondary | ICD-10-CM

## 2021-07-12 MED ORDER — CLOTRIMAZOLE-BETAMETHASONE 1-0.05 % EX CREA: 1.0000 "application " | TOPICAL_CREAM | Freq: Two times a day (BID) | CUTANEOUS | 0 refills | Status: DC

## 2021-07-27 ENCOUNTER — Other Ambulatory Visit: Payer: Self-pay | Admitting: Internal Medicine

## 2021-07-27 DIAGNOSIS — T502X5A Adverse effect of carbonic-anhydrase inhibitors, benzothiadiazides and other diuretics, initial encounter: Secondary | ICD-10-CM

## 2021-07-27 DIAGNOSIS — I10 Essential (primary) hypertension: Secondary | ICD-10-CM

## 2021-08-03 NOTE — Progress Notes (Deleted)
Name: Alicia Hoover  MRN/ DOB: [id number], 02-Aug-1976    Age/ Sex: 45 y.o., female     PCP: [name], MD   Reason for Endocrinology Evaluation: Hyperthyroidism     Initial Endocrinology Clinic Visit: 07/10/2019    PATIENT IDENTIFIER: Alicia Hoover is a 45 y.o., female with a past medical history of HTN, Obesity and Depression. She has followed with [facility] since 07/10/2019 for consultative assistance with management of her hyperthyroidism  HISTORICAL SUMMARY: The patient was first diagnosed with subclinical hyperthyroidism in 04/2019 with a TSh of 0.31 uIU/mL . She was started on Methimazole 04/2019 TRAb elevated at 14.60 IU/L  No Amiodarone  Was on Biotin until 05/2019   No radiation exposure.   Brother with thyroid disease     HYPOKALEMIA HISTORY: Pt has been noted with persistent and spontaneous hypokalemia since 08/2020 despite being on spironolactone. She was also noted with pre-diabetes with an A1c of 5.9 % in 02/2021 .  We proceeded with aldo: renin check up as well as screening for cushing syndrome 04/2021. Aldo and renin were normal but she did not submit 24- hr urine for cortisol yet     SUBJECTIVE:    Today (08/04/2021):  Alicia Hoover is here for a follow up on hyperthyroidism secondary to Graves' disease.    Has been noted with weight loss that she attributes to Gerster with methimazole  Denies diarrhea.  Denies recent fever  Denies local neck symptoms   Denies eye symptoms    HISTORY:  Past Medical History:  Past Medical History:  Diagnosis Date   Asthma    GERD (gastroesophageal reflux disease)    Hypersomnia with sleep apnea    Hypertension    Migraine    Rhinitis    Past Surgical History:  Past Surgical History:  Procedure Laterality Date   CHOLECYSTECTOMY     TOOTH EXTRACTION     Social History:  reports that she quit smoking about 21 years ago. Her smoking use included cigarettes. She has a  0.10 pack-year smoking history. She has never used smokeless tobacco. She reports current alcohol use. She reports that she does not use drugs. Family History:  Family History  Problem Relation Age of Onset   Diabetes Father    Early death Father    Diabetes Mother    COPD Mother    Rheum arthritis Mother    Hyperlipidemia Brother    Thyroid disease Brother    Alcohol abuse Neg Hx    Cancer Neg Hx    Depression Neg Hx    Drug abuse Neg Hx    Hearing loss Neg Hx    Heart disease Neg Hx    Hypertension Neg Hx    Stroke Neg Hx      HOME MEDICATIONS: Allergies as of 08/04/2021       Reactions   Codeine Hives, Nausea And Vomiting   Tramadol Diarrhea, Nausea And Vomiting        Medication List        Accurate as of August 04, 2021  7:10 AM. If you have any questions, ask your nurse or doctor.          acetaminophen 650 MG CR tablet Commonly known as: Tylenol 8 Hour Take 1 tablet (650 mg total) by mouth every 8 (eight) hours as needed for pain.   clotrimazole-betamethasone cream Commonly known as: Lotrisone Apply 1 application topically 2 (two) times daily.   diclofenac  75 MG EC tablet Commonly known as: VOLTAREN Take 1 tablet (75 mg total) by mouth 2 (two) times daily as needed for mild pain.   fluconazole 200 MG tablet Commonly known as: DIFLUCAN TAKE 1 TABLET (200 MG TOTAL) BY MOUTH EVERY 3 (THREE) DAYS.   fluticasone 50 MCG/ACT nasal spray Commonly known as: FLONASE Place 2 sprays into the nose daily.   levocetirizine 5 MG tablet Commonly known as: XYZAL Take 1 tablet (5 mg total) by mouth every evening.   lisdexamfetamine 40 MG capsule Commonly known as: Vyvanse Take 1 capsule (40 mg total) by mouth every morning.   methimazole 10 MG tablet Commonly known as: TAPAZOLE Take 1.5 tablets (15 mg total) by mouth daily.   montelukast 10 MG tablet Commonly known as: SINGULAIR Take 1 tablet (10 mg total) by mouth at bedtime.   multivitamin  tablet Take 1 tablet by mouth daily.   nadolol 20 MG tablet Commonly known as: CORGARD TAKE 1 TABLET BY MOUTH EVERY DAY   norgestimate-ethinyl estradiol 0.25-35 MG-MCG tablet Commonly known as: ORTHO-CYCLEN Take 1 tablet by mouth at bedtime.   Nurtec 75 MG Tbdp Generic drug: Rimegepant Sulfate Take 1 tablet by mouth daily as needed.   Ozempic (1 MG/DOSE) 4 MG/3ML Sopn Generic drug: Semaglutide (1 MG/DOSE) INJECT 1 MG INTO THE SKIN ONCE A WEEK.   pantoprazole 40 MG tablet Commonly known as: PROTONIX Take 1 tablet (40 mg total) by mouth 2 (two) times daily.   potassium chloride SA 20 MEQ tablet Commonly known as: Klor-Con M20 Take 1 tablet (20 mEq total) by mouth 2 (two) times daily.   solifenacin 10 MG tablet Commonly known as: VESIcare Take 1 tablet (10 mg total) by mouth daily.   spironolactone 25 MG tablet Commonly known as: ALDACTONE TAKE 1 TABLET (25 MG TOTAL) BY MOUTH DAILY.   Trintellix 10 MG Tabs tablet Generic drug: vortioxetine HBr Take 10 mg by mouth daily.          OBJECTIVE:   PHYSICAL EXAM: VS: There were no vitals taken for this visit.   EXAM: General: Pt appears well and is in NAD  Eyes: External eye exam normal without stare, lid lag or exophthalmos.  EOM intact.    Neck: General: Supple without adenopathy. Thyroid: Thyroid size normal.  No goiter or nodules appreciated. No thyroid bruit.  Lungs: Clear with good BS bilat with no rales, rhonchi, or wheezes  Heart: Auscultation: RRR.  Extremities: BL LE: No pretibial edema normal ROM and strength.  Neuro: Cranial nerves: II - XII grossly intact  Motor: Normal strength throughout DTRs: 2+ and symmetric in UE without delay in relaxation phase  Mental Status: Judgment, insight: Intact Orientation: Oriented to time, place, and person Mood and affect: No depression, anxiety, or agitation     DATA REVIEWED: Results for ROMELIA, BROMELL (MRN [id number]) as of 04/28/2021 17:12  Ref. Range  04/28/2021 09:14  Potassium Latest Ref Range: 3.5 - 5.1 mEq/L 4.2  TSH Latest Ref Range: 0.35 - 5.50 uIU/mL 6.54 (H)  T4,Free(Direct) Latest Ref Range: 0.60 - 1.60 ng/dL 0.84    Results for CASSANDRA, HARBOLD (MRN [id number]) as of 12/22/2020 07:58  Ref. Range 10/03/2020 13:47  WBC Latest Ref Range: 4.0 - 10.5 K/uL 8.1  RBC Latest Ref Range: 3.87 - 5.11 MIL/uL 5.15 (H)  Hemoglobin Latest Ref Range: 12.0 - 15.0 g/dL 13.6  HCT Latest Ref Range: 36.0 - 46.0 % 42.7  MCV Latest Ref Range: 80.0 - 100.0 fL 82.9  MCH Latest Ref Range: 26.0 - 34.0 pg 26.4  MCHC Latest Ref Range: 30.0 - 36.0 g/dL 31.9  RDW Latest Ref Range: 11.5 - 15.5 % 13.6  Platelets Latest Ref Range: 150 - 400 K/uL 339  nRBC Latest Ref Range: 0.0 - 0.2 % 0.0  Results for EIRA, ALPERT (MRN [id number]) as of 12/22/2020 07:58  Ref. Range 10/03/2020 13:47  Sodium Latest Ref Range: 135 - 145 mmol/L 137  Potassium Latest Ref Range: 3.5 - 5.1 mmol/L 3.4 (L)  Chloride Latest Ref Range: 98 - 111 mmol/L 99  CO2 Latest Ref Range: 22 - 32 mmol/L 26  Glucose Latest Ref Range: 70 - 99 mg/dL 89  BUN Latest Ref Range: 6 - 20 mg/dL 13  Creatinine Latest Ref Range: 0.44 - 1.00 mg/dL 0.87  Calcium Latest Ref Range: 8.9 - 10.3 mg/dL 8.9  Anion gap Latest Ref Range: 5 - 15  12  GFR, Estimated Latest Ref Range: >60 mL/min >60    ASSESSMENT / PLAN / RECOMMENDATIONS:   Hyperthyroidism Secondary to Graves' Disease:   - Clinically she is euthyroid  - No local neck symptoms -TSH is elevated, will reduce methimazole as below   Medications   Decrease methimazole 10 mg , 1.5 tabs daily     2. Graves' Disease:    - No extrathyroidal manifestations of Grave's disease.    3. Spontaneous Hypokalemia :  - Pt on Spironolactone  - Will proceed with aldo: renin and 24-hr urinary cortisol      F/U in 4 months       Signed electronically by: Mack Guise, MD  [facility]  [facility] [address], [place] [address] Phone: [phone] FAX: [phone]      CC: [name], MD [place] [address] Phone: [phone]  Fax: [phone]   Return to Endocrinology clinic as below: Future Appointments  Date Time Provider [place]  08/04/2021  7:30 AM Wanetta Funderburke, Melanie Crazier, MD [facility] None

## 2021-08-04 ENCOUNTER — Ambulatory Visit: Payer: BC Managed Care – PPO | Admitting: Internal Medicine

## 2021-08-07 ENCOUNTER — Other Ambulatory Visit: Payer: Self-pay | Admitting: Obstetrics

## 2021-08-07 ENCOUNTER — Other Ambulatory Visit: Payer: Self-pay | Admitting: Internal Medicine

## 2021-08-07 DIAGNOSIS — F5081 Binge eating disorder: Secondary | ICD-10-CM

## 2021-08-07 DIAGNOSIS — R7303 Prediabetes: Secondary | ICD-10-CM

## 2021-08-08 ENCOUNTER — Other Ambulatory Visit: Payer: Self-pay | Admitting: Internal Medicine

## 2021-08-08 DIAGNOSIS — R7303 Prediabetes: Secondary | ICD-10-CM

## 2021-08-08 MED ORDER — LISDEXAMFETAMINE DIMESYLATE 40 MG PO CAPS: 40.0000 mg | ORAL_CAPSULE | ORAL | 0 refills | Status: DC

## 2021-08-08 MED ORDER — OZEMPIC (1 MG/DOSE) 4 MG/3ML ~~LOC~~ SOPN: 1.0000 mg | PEN_INJECTOR | SUBCUTANEOUS | 0 refills | Status: DC

## 2021-08-21 ENCOUNTER — Other Ambulatory Visit: Payer: Self-pay | Admitting: Internal Medicine

## 2021-08-21 DIAGNOSIS — N3281 Overactive bladder: Secondary | ICD-10-CM

## 2021-08-21 DIAGNOSIS — L239 Allergic contact dermatitis, unspecified cause: Secondary | ICD-10-CM

## 2021-09-12 ENCOUNTER — Ambulatory Visit: Payer: BC Managed Care – PPO | Admitting: Obstetrics

## 2021-09-21 ENCOUNTER — Other Ambulatory Visit: Payer: Self-pay | Admitting: Internal Medicine

## 2021-09-21 DIAGNOSIS — F5081 Binge eating disorder: Secondary | ICD-10-CM

## 2021-09-21 DIAGNOSIS — R21 Rash and other nonspecific skin eruption: Secondary | ICD-10-CM

## 2021-09-27 ENCOUNTER — Other Ambulatory Visit: Payer: Self-pay | Admitting: Internal Medicine

## 2021-09-27 DIAGNOSIS — F5081 Binge eating disorder: Secondary | ICD-10-CM

## 2021-10-03 ENCOUNTER — Encounter: Payer: Self-pay | Admitting: Internal Medicine

## 2021-10-05 ENCOUNTER — Other Ambulatory Visit: Payer: Self-pay

## 2021-10-05 ENCOUNTER — Ambulatory Visit: Payer: BC Managed Care – PPO | Admitting: Internal Medicine

## 2021-10-05 ENCOUNTER — Encounter: Payer: Self-pay | Admitting: Internal Medicine

## 2021-10-05 VITALS — BP 128/84 | HR 86 | Temp 98.5°F | Resp 16 | Ht 65.0 in | Wt 282.0 lb

## 2021-10-05 DIAGNOSIS — Z0000 Encounter for general adult medical examination without abnormal findings: Secondary | ICD-10-CM

## 2021-10-05 DIAGNOSIS — I10 Essential (primary) hypertension: Secondary | ICD-10-CM

## 2021-10-05 DIAGNOSIS — K2100 Gastro-esophageal reflux disease with esophagitis, without bleeding: Secondary | ICD-10-CM | POA: Diagnosis not present

## 2021-10-05 DIAGNOSIS — Z0001 Encounter for general adult medical examination with abnormal findings: Secondary | ICD-10-CM | POA: Insufficient documentation

## 2021-10-05 DIAGNOSIS — E0500 Thyrotoxicosis with diffuse goiter without thyrotoxic crisis or storm: Secondary | ICD-10-CM | POA: Diagnosis not present

## 2021-10-05 DIAGNOSIS — F5081 Binge eating disorder: Secondary | ICD-10-CM

## 2021-10-05 DIAGNOSIS — Z1211 Encounter for screening for malignant neoplasm of colon: Secondary | ICD-10-CM | POA: Insufficient documentation

## 2021-10-05 LAB — BASIC METABOLIC PANEL
BUN: 14 mg/dL (ref 6–23)
CO2: 30 mEq/L (ref 19–32)
Calcium: 8.7 mg/dL (ref 8.4–10.5)
Chloride: 102 mEq/L (ref 96–112)
Creatinine, Ser: 0.88 mg/dL (ref 0.40–1.20)
GFR: 78.69 mL/min (ref >60.00)
Glucose, Bld: 76 mg/dL (ref 70–99)
Potassium: 3.9 mEq/L (ref 3.5–5.1)
Sodium: 138 mEq/L (ref 135–145)

## 2021-10-05 LAB — HEPATIC FUNCTION PANEL
ALT: 15 U/L (ref 0–35)
AST: 16 U/L (ref 0–37)
Albumin: 3.8 g/dL (ref 3.5–5.2)
Alkaline Phosphatase: 82 U/L (ref 39–117)
Bilirubin, Direct: 0.1 mg/dL (ref 0.0–0.3)
Total Bilirubin: 0.4 mg/dL (ref 0.2–1.2)
Total Protein: 6.7 g/dL (ref 6.0–8.3)

## 2021-10-05 LAB — CBC WITH DIFFERENTIAL/PLATELET
Basophils Absolute: 0 10*3/uL (ref 0.0–0.1)
Basophils Relative: 0.2 % (ref 0.0–3.0)
Eosinophils Absolute: 0.1 10*3/uL (ref 0.0–0.7)
Eosinophils Relative: 1.4 % (ref 0.0–5.0)
HCT: 40.6 % (ref 36.0–46.0)
Hemoglobin: 13 g/dL (ref 12.0–15.0)
Lymphocytes Relative: 30.8 % (ref 12.0–46.0)
Lymphs Abs: 2.7 10*3/uL (ref 0.7–4.0)
MCHC: 32 g/dL (ref 30.0–36.0)
MCV: 81.2 fl (ref 78.0–100.0)
Monocytes Absolute: 0.7 10*3/uL (ref 0.1–1.0)
Monocytes Relative: 7.7 % (ref 3.0–12.0)
Neutro Abs: 5.2 10*3/uL (ref 1.4–7.7)
Neutrophils Relative %: 59.9 % (ref 43.0–77.0)
Platelets: 365 10*3/uL (ref 150.0–400.0)
RBC: 5.01 Mil/uL (ref 3.87–5.11)
RDW: 13.8 % (ref 11.5–15.5)
WBC: 8.6 10*3/uL (ref 4.0–10.5)

## 2021-10-05 LAB — LIPID PANEL
Cholesterol: 138 mg/dL (ref 0–200)
HDL: 35.8 mg/dL — ABNORMAL LOW (ref >39.00)
LDL Cholesterol: 74 mg/dL (ref 0–99)
NonHDL: 101.91
Total CHOL/HDL Ratio: 4
Triglycerides: 140 mg/dL (ref 0.0–149.0)
VLDL: 28 mg/dL (ref 0.0–40.0)

## 2021-10-05 LAB — TSH: TSH: 2.7 u[IU]/mL (ref 0.35–5.50)

## 2021-10-05 MED ORDER — LISDEXAMFETAMINE DIMESYLATE 40 MG PO CAPS: 40.0000 mg | ORAL_CAPSULE | ORAL | 0 refills | Status: DC

## 2021-10-05 NOTE — Patient Instructions (Signed)

## 2021-10-05 NOTE — Progress Notes (Signed)
Subjective:  Patient ID: Alicia Hoover, female    DOB: 1976-08-15  Age: 45 y.o. MRN: [id number]  CC: Annual Exam  This visit occurred during the SARS-CoV-2 public health emergency.  Safety protocols were in place, including screening questions prior to the visit, additional usage of staff PPE, and extensive cleaning of exam room while observing appropriate contact time as indicated for disinfecting solutions.    HPI Alicia Hoover presents for a CPX and f/up -   She tells me her blood pressure is adequately well controlled.  She is active and denies chest pain, shortness of breath, diaphoresis, edema, dizziness, or lightheadedness.  She is tolerating Vyvanse well.  Outpatient Medications Prior to Visit  Medication Sig Dispense Refill   acetaminophen (TYLENOL 8 HOUR) 650 MG CR tablet Take 1 tablet (650 mg total) by mouth every 8 (eight) hours as needed for pain. 15 tablet 0   clotrimazole-betamethasone (LOTRISONE) cream APPLY TO AFFECTED AREA TWICE A DAY 45 g 0   diclofenac (VOLTAREN) 75 MG EC tablet Take 1 tablet (75 mg total) by mouth 2 (two) times daily as needed for mild pain. 180 tablet 0   fluticasone (FLONASE) 50 MCG/ACT nasal spray Place 2 sprays into the nose daily.     levocetirizine (XYZAL) 5 MG tablet TAKE 1 TABLET BY MOUTH EVERY DAY IN THE EVENING 90 tablet 1   methimazole (TAPAZOLE) 10 MG tablet Take 1.5 tablets (15 mg total) by mouth daily. 135 tablet 1   montelukast (SINGULAIR) 10 MG tablet Take 1 tablet (10 mg total) by mouth at bedtime. 90 tablet 1   Multiple Vitamin (MULTIVITAMIN) tablet Take 1 tablet by mouth daily.     norgestimate-ethinyl estradiol (ORTHO-CYCLEN) 0.25-35 MG-MCG tablet TAKE 1 TABLET BY MOUTH EVERY DAY 84 tablet 0   pantoprazole (PROTONIX) 40 MG tablet Take 1 tablet (40 mg total) by mouth 2 (two) times daily. 180 tablet 1   Rimegepant Sulfate (NURTEC) 75 MG TBDP Take 1 tablet by mouth daily as needed. 8 tablet 3   Semaglutide, 1 MG/DOSE, (OZEMPIC, 1  MG/DOSE,) 4 MG/3ML SOPN Inject 1 mg into the skin once a week. 9 mL 0   solifenacin (VESICARE) 10 MG tablet TAKE 1 TABLET BY MOUTH EVERY DAY 90 tablet 1   spironolactone (ALDACTONE) 25 MG tablet TAKE 1 TABLET (25 MG TOTAL) BY MOUTH DAILY. 90 tablet 0   TRINTELLIX 10 MG TABS tablet Take 10 mg by mouth daily.     fluconazole (DIFLUCAN) 200 MG tablet TAKE 1 TABLET (200 MG TOTAL) BY MOUTH EVERY 3 (THREE) DAYS. 3 tablet 2   lisdexamfetamine (VYVANSE) 40 MG capsule Take 1 capsule (40 mg total) by mouth every morning. 30 capsule 0   nadolol (CORGARD) 20 MG tablet TAKE 1 TABLET BY MOUTH EVERY DAY 90 tablet 3   potassium chloride SA (KLOR-CON M20) 20 MEQ tablet Take 1 tablet (20 mEq total) by mouth 2 (two) times daily. 180 tablet 1   No facility-administered medications prior to visit.    ROS Review of Systems  Constitutional:  Negative for diaphoresis, fatigue and unexpected weight change.  HENT: Negative.    Eyes: Negative.   Respiratory:  Negative for cough, chest tightness, shortness of breath and wheezing.   Cardiovascular:  Negative for chest pain, palpitations and leg swelling.  Gastrointestinal:  Negative for abdominal pain, constipation, diarrhea, nausea and vomiting.  Endocrine: Negative.   Genitourinary: Negative.  Negative for difficulty urinating, dysuria and hematuria.  Musculoskeletal: Negative.   Skin:  Negative.   Neurological: Negative.  Negative for dizziness, weakness, light-headedness and numbness.  Hematological:  Negative for adenopathy. Does not bruise/bleed easily.  Psychiatric/Behavioral: Negative.  Negative for decreased concentration, dysphoric mood and suicidal ideas. The patient is not nervous/anxious and is not hyperactive.    Objective:  BP 128/84 (BP Location: Left Arm, Patient Position: Sitting)    Pulse 86    Temp 98.5 F (36.9 C) (Oral)    Resp 16    Ht 5' 5"  (1.651 m)    Wt 282 lb (127.9 kg)    LMP 09/27/2021 (Exact Date)    SpO2 98%    BMI 46.93 kg/m   BP  Readings from Last 3 Encounters:  10/05/21 128/84  04/28/21 122/78  02/14/21 118/76    Wt Readings from Last 3 Encounters:  10/05/21 282 lb (127.9 kg)  04/28/21 292 lb (132.5 kg)  02/14/21 (!) 306 lb (138.8 kg)    Physical Exam Vitals reviewed.  Constitutional:      Appearance: She is obese.  HENT:     Nose: Nose normal.     Mouth/Throat:     Mouth: Mucous membranes are moist.  Eyes:     General: No scleral icterus.    Conjunctiva/sclera: Conjunctivae normal.  Cardiovascular:     Rate and Rhythm: Normal rate and regular rhythm.     Heart sounds: No murmur heard. Pulmonary:     Effort: Pulmonary effort is normal.     Breath sounds: No stridor. No wheezing, rhonchi or rales.  Abdominal:     General: Abdomen is protuberant. Bowel sounds are normal. There is no distension.     Palpations: Abdomen is soft. There is no hepatomegaly, splenomegaly or mass.     Tenderness: There is no abdominal tenderness. There is no guarding.  Musculoskeletal:        General: Normal range of motion.     Cervical back: Neck supple.  Lymphadenopathy:     Cervical: No cervical adenopathy.  Skin:    General: Skin is warm and dry.  Neurological:     General: No focal deficit present.     Mental Status: She is alert.  Psychiatric:        Mood and Affect: Mood normal.        Behavior: Behavior normal.    Lab Results  Component Value Date   WBC 8.6 10/05/2021   HGB 13.0 10/05/2021   HCT 40.6 10/05/2021   PLT 365.0 10/05/2021   GLUCOSE 76 10/05/2021   CHOL 138 10/05/2021   TRIG 140.0 10/05/2021   HDL 35.80 (L) 10/05/2021   LDLDIRECT 57.0 03/27/2017   LDLCALC 74 10/05/2021   ALT 15 10/05/2021   AST 16 10/05/2021   NA 138 10/05/2021   K 3.9 10/05/2021   CL 102 10/05/2021   CREATININE 0.88 10/05/2021   BUN 14 10/05/2021   CO2 30 10/05/2021   TSH 2.70 10/05/2021   HGBA1C 5.9 02/14/2021    MM 3D SCREEN BREAST BILATERAL  Result Date: 05/10/2021 CLINICAL DATA:  Screening. EXAM:  DIGITAL SCREENING BILATERAL MAMMOGRAM WITH TOMOSYNTHESIS AND CAD TECHNIQUE: Bilateral screening digital craniocaudal and mediolateral oblique mammograms were obtained. Bilateral screening digital breast tomosynthesis was performed. The images were evaluated with computer-aided detection. COMPARISON:  Previous exam(s). ACR Breast Density Category a: The breast tissue is almost entirely fatty. FINDINGS: There are no findings suspicious for malignancy. IMPRESSION: No mammographic evidence of malignancy. A result letter of this screening mammogram will be mailed directly to the patient.  RECOMMENDATION: Screening mammogram in one year. (Code:SM-B-01Y) BI-RADS CATEGORY  1: Negative. Electronically Signed   By: Franki Cabot M.D.   On: 05/10/2021 08:30    Assessment & Plan:   Carrigan was seen today for annual exam.  Diagnoses and all orders for this visit:  Essential hypertension, benign- Her blood pressure is adequately well controlled.  Electrolytes and renal function are normal. -     Basic metabolic panel; Future -     TSH; Future -     Hepatic function panel; Future -     Hepatic function panel -     TSH -     Basic metabolic panel  Binge-eating disorder, moderate- Improvement noted. -     lisdexamfetamine (VYVANSE) 40 MG capsule; Take 1 capsule (40 mg total) by mouth every morning.  Morbid obesity ([place])- Improvement noted. -     TSH; Future -     Hepatic function panel; Future -     Hepatic function panel -     TSH  Graves disease- Her TSH is normal.  Will continue the current dose of methimazole. -     TSH; Future -     TSH  Gastroesophageal reflux disease with esophagitis without hemorrhage- She is doing well on the PPI.  No complications noted. -     CBC with Differential/Platelet; Future -     CBC with Differential/Platelet  Encounter for general adult medical examination with abnormal findings- Exam completed, labs reviewed, vaccines reviewed-she refuses to get vaccinated against  influenza or COVID-19, cancer screenings addressed, patient education was given. -     Lipid panel; Future -     Lipid panel  Screen for colon cancer -     Cologuard   I have discontinued Courtnee L. Arps's fluconazole, nadolol, and potassium chloride SA. I am also having her maintain her fluticasone, multivitamin, Trintellix, acetaminophen, diclofenac, Nurtec, montelukast, pantoprazole, methimazole, spironolactone, norgestimate-ethinyl estradiol, Ozempic (1 MG/DOSE), solifenacin, levocetirizine, clotrimazole-betamethasone, and lisdexamfetamine.  Meds ordered this encounter  Medications   lisdexamfetamine (VYVANSE) 40 MG capsule    Sig: Take 1 capsule (40 mg total) by mouth every morning.    Dispense:  30 capsule    Refill:  0     Follow-up: Return in about 6 months (around 04/05/2022).  Scarlette Calico, MD

## 2021-10-19 ENCOUNTER — Other Ambulatory Visit: Payer: Self-pay

## 2021-10-19 ENCOUNTER — Encounter: Payer: Self-pay | Admitting: Internal Medicine

## 2021-10-19 ENCOUNTER — Ambulatory Visit (INDEPENDENT_AMBULATORY_CARE_PROVIDER_SITE_OTHER): Payer: BC Managed Care – PPO | Admitting: Internal Medicine

## 2021-10-19 VITALS — BP 120/74 | HR 78 | Ht 65.0 in | Wt 280.0 lb

## 2021-10-19 DIAGNOSIS — E876 Hypokalemia: Secondary | ICD-10-CM | POA: Diagnosis not present

## 2021-10-19 DIAGNOSIS — E0500 Thyrotoxicosis with diffuse goiter without thyrotoxic crisis or storm: Secondary | ICD-10-CM | POA: Diagnosis not present

## 2021-10-19 DIAGNOSIS — E0590 Thyrotoxicosis, unspecified without thyrotoxic crisis or storm: Secondary | ICD-10-CM | POA: Diagnosis not present

## 2021-10-19 MED ORDER — METHIMAZOLE 10 MG PO TABS: 15.0000 mg | ORAL_TABLET | Freq: Every day | ORAL | 1 refills | Status: DC

## 2021-10-19 NOTE — Progress Notes (Addendum)
Name: Alicia Hoover  MRN/ DOB: [id number], 03/11/1976    Age/ Sex: 46 y.o., female     PCP: [name], MD   Reason for Endocrinology Evaluation: Hyperthyroidism     Initial Endocrinology Clinic Visit: 07/10/2019    PATIENT IDENTIFIER: Alicia Hoover is a 46 y.o., female with a past medical history of HTN, Obesity and Depression. She has followed with [facility] since 07/10/2019 for consultative assistance with management of her hyperthyroidism  HISTORICAL SUMMARY: The patient was first diagnosed with subclinical hyperthyroidism in 04/2019 with a TSh of 0.31 uIU/mL . She was started on Methimazole 04/2019 TRAb elevated at 14.60 IU/L  No Amiodarone  Was on Biotin until 05/2019   No radiation exposure.   Brother with thyroid disease     HYPOKALEMIA HISTORY: Pt has been noted with persistent and spontaneous hypokalemia since 08/2020 despite being on spironolactone. She was also noted with pre-diabetes with an A1c of 5.9 % in 02/2021 .  We proceeded with aldo: renin check up as well as screening for cushing syndrome 04/2021    SUBJECTIVE:    Today (10/19/2021):  Alicia Hoover is here for a follow up on hyperthyroidism secondary to Graves' disease.    Has been noted with weight loss that she attributes to Ozempic  Denies diarrhea or constipation  Denies local neck symptoms  Denies eye symptoms  Denies palpitations    HISTORY:  Past Medical History:  Past Medical History:  Diagnosis Date   Asthma    GERD (gastroesophageal reflux disease)    Hypersomnia with sleep apnea    Hypertension    Migraine    Rhinitis    Past Surgical History:  Past Surgical History:  Procedure Laterality Date   CHOLECYSTECTOMY     TOOTH EXTRACTION     Social History:  reports that she quit smoking about 22 years ago. Her smoking use included cigarettes. She has a 0.10 pack-year smoking history. She has never used smokeless tobacco. She reports current alcohol use.  She reports that she does not use drugs. Family History:  Family History  Problem Relation Age of Onset   Diabetes Father    Early death Father    Diabetes Mother    COPD Mother    Rheum arthritis Mother    Hyperlipidemia Brother    Thyroid disease Brother    Alcohol abuse Neg Hx    Cancer Neg Hx    Depression Neg Hx    Drug abuse Neg Hx    Hearing loss Neg Hx    Heart disease Neg Hx    Hypertension Neg Hx    Stroke Neg Hx      HOME MEDICATIONS: Allergies as of 10/19/2021       Reactions   Codeine Hives, Nausea And Vomiting   Tramadol Diarrhea, Nausea And Vomiting        Medication List        Accurate as of October 19, 2021 12:17 PM. If you have any questions, ask your nurse or doctor.          acetaminophen 650 MG CR tablet Commonly known as: Tylenol 8 Hour Take 1 tablet (650 mg total) by mouth every 8 (eight) hours as needed for pain.   clotrimazole-betamethasone cream Commonly known as: LOTRISONE APPLY TO AFFECTED AREA TWICE A DAY   diclofenac 75 MG EC tablet Commonly known as: VOLTAREN Take 1 tablet (75 mg total) by mouth 2 (two) times daily as needed for  mild pain.   fluticasone 50 MCG/ACT nasal spray Commonly known as: FLONASE Place 2 sprays into the nose daily.   levocetirizine 5 MG tablet Commonly known as: XYZAL TAKE 1 TABLET BY MOUTH EVERY DAY IN THE EVENING   lisdexamfetamine 40 MG capsule Commonly known as: Vyvanse Take 1 capsule (40 mg total) by mouth every morning.   methimazole 10 MG tablet Commonly known as: TAPAZOLE Take 1.5 tablets (15 mg total) by mouth daily.   montelukast 10 MG tablet Commonly known as: SINGULAIR Take 1 tablet (10 mg total) by mouth at bedtime.   multivitamin tablet Take 1 tablet by mouth daily.   norgestimate-ethinyl estradiol 0.25-35 MG-MCG tablet Commonly known as: ORTHO-CYCLEN TAKE 1 TABLET BY MOUTH EVERY DAY   Nurtec 75 MG Tbdp Generic drug: Rimegepant Sulfate Take 1 tablet by mouth daily as  needed.   Ozempic (1 MG/DOSE) 4 MG/3ML Sopn Generic drug: Semaglutide (1 MG/DOSE) Inject 1 mg into the skin once a week.   pantoprazole 40 MG tablet Commonly known as: PROTONIX Take 1 tablet (40 mg total) by mouth 2 (two) times daily.   solifenacin 10 MG tablet Commonly known as: VESICARE TAKE 1 TABLET BY MOUTH EVERY DAY   spironolactone 25 MG tablet Commonly known as: ALDACTONE TAKE 1 TABLET (25 MG TOTAL) BY MOUTH DAILY.   Trintellix 10 MG Tabs tablet Generic drug: vortioxetine HBr Take 10 mg by mouth daily.          OBJECTIVE:   PHYSICAL EXAM: VS: BP 120/74 (BP Location: Left Arm, Patient Position: Sitting, Cuff Size: Small)    Pulse 78    Ht 5' 5"  (1.651 m)    Wt 280 lb (127 kg)    LMP 09/27/2021 (Exact Date)    BMI 46.59 kg/m    EXAM: General: Pt appears well and is in NAD  Eyes: External eye exam normal without stare, lid lag or exophthalmos.  EOM intact.    Neck: General: Supple without adenopathy. Thyroid: Thyroid size normal.  No goiter or nodules appreciated.  Lungs: Clear with good BS bilat with no rales, rhonchi, or wheezes  Heart: Auscultation: RRR.  Extremities: BL LE: No pretibial edema normal ROM and strength.  Mental Status: Judgment, insight: Intact Orientation: Oriented to time, place, and person Mood and affect: No depression, anxiety, or agitation     DATA REVIEWED:  10/05/2021 TSH 2.7 uIU/mL    ASSESSMENT / PLAN / RECOMMENDATIONS:   Hyperthyroidism Secondary to Graves' Disease:   - Clinically she is euthyroid  - No local neck symptoms -TSH normal at PCPs office, no changes -We discussed alternative options of treatment such as radioactive iodine ablation versus surgery, patient would like to continue with methimazole at this time   Medications   Continue methimazole 10 mg , 1.5 tabs daily     2. Graves' Disease:    - No extrathyroidal manifestations of Grave's disease.    3. Spontaneous Hypokalemia :  - Pt on  Spironolactone  - Aldo: renin  was normal 04/2021 knowing that part of this is spironolactone. She has not submitted and 24-hr urinary cortisol .  We discussed Cushing syndrome today, patient states that she has been losing weight successfully, her BP is well controlled, and potassium has been normal and does not seem to be keen on Cushing swing.  I also offered dexamethasone suppression test -We will continue to monitor     F/U in 6 months       Signed electronically by: Elenora Gamma  Kelton Pillar, MD  [facility] Endocrinology  [facility] [address] [place], [place] [address] Phone: [phone] FAX: [phone]      CC: [name], MD [place] [address] Phone: [phone]  Fax: [phone]   Return to Endocrinology clinic as below: Future Appointments  Date Time Provider [place]  11/01/2021 10:15 AM Shelly Bombard, MD [facility] None  04/18/2022 11:10 AM Audiel Scheiber, Melanie Crazier, MD [facility] None

## 2021-10-27 ENCOUNTER — Other Ambulatory Visit: Payer: Self-pay | Admitting: Internal Medicine

## 2021-10-31 LAB — COLOGUARD: COLOGUARD: NEGATIVE

## 2021-11-01 ENCOUNTER — Other Ambulatory Visit (HOSPITAL_COMMUNITY)
Admission: RE | Admit: 2021-11-01 | Discharge: 2021-11-01 | Disposition: A | Discharge status: Home / Self Care | Payer: BC Managed Care – PPO | Source: Ambulatory Visit | Attending: Obstetrics | Admitting: Obstetrics

## 2021-11-01 ENCOUNTER — Other Ambulatory Visit: Payer: Self-pay | Admitting: Internal Medicine

## 2021-11-01 ENCOUNTER — Ambulatory Visit (INDEPENDENT_AMBULATORY_CARE_PROVIDER_SITE_OTHER): Payer: BC Managed Care – PPO | Admitting: Obstetrics

## 2021-11-01 ENCOUNTER — Encounter: Payer: Self-pay | Admitting: Obstetrics

## 2021-11-01 ENCOUNTER — Other Ambulatory Visit: Payer: Self-pay

## 2021-11-01 VITALS — BP 156/93 | HR 76 | Ht 65.0 in | Wt 279.0 lb

## 2021-11-01 DIAGNOSIS — N898 Other specified noninflammatory disorders of vagina: Secondary | ICD-10-CM | POA: Insufficient documentation

## 2021-11-01 DIAGNOSIS — G43009 Migraine without aura, not intractable, without status migrainosus: Secondary | ICD-10-CM

## 2021-11-01 DIAGNOSIS — Z6841 Body Mass Index (BMI) 40.0 and over, adult: Secondary | ICD-10-CM

## 2021-11-01 DIAGNOSIS — E876 Hypokalemia: Secondary | ICD-10-CM

## 2021-11-01 DIAGNOSIS — B369 Superficial mycosis, unspecified: Secondary | ICD-10-CM

## 2021-11-01 DIAGNOSIS — Z01419 Encounter for gynecological examination (general) (routine) without abnormal findings: Secondary | ICD-10-CM | POA: Diagnosis present

## 2021-11-01 DIAGNOSIS — N393 Stress incontinence (female) (male): Secondary | ICD-10-CM | POA: Diagnosis not present

## 2021-11-01 DIAGNOSIS — N946 Dysmenorrhea, unspecified: Secondary | ICD-10-CM

## 2021-11-01 DIAGNOSIS — Z3041 Encounter for surveillance of contraceptive pills: Secondary | ICD-10-CM | POA: Diagnosis not present

## 2021-11-01 DIAGNOSIS — I10 Essential (primary) hypertension: Secondary | ICD-10-CM

## 2021-11-01 MED ORDER — NORGESTIMATE-ETH ESTRADIOL 0.25-35 MG-MCG PO TABS: 1.0000 | ORAL_TABLET | Freq: Every day | ORAL | 4 refills | Status: DC

## 2021-11-01 MED ORDER — IBUPROFEN 800 MG PO TABS: 800.0000 mg | ORAL_TABLET | Freq: Three times a day (TID) | ORAL | 5 refills | Status: DC | PRN

## 2021-11-01 MED ORDER — CICLOPIROX OLAMINE 0.77 % EX CREA
TOPICAL_CREAM | Freq: Two times a day (BID) | CUTANEOUS | 4 refills | Status: DC
Start: 2021-11-01 — End: 2022-11-02

## 2021-11-01 NOTE — Progress Notes (Signed)
Subjective:        Alicia Hoover is a 46 y.o. female here for a routine exam.  Current complaints: Vaginal discharge and rash in groin area.    Personal health questionnaire:  Is patient [name], have a family history of breast and/or ovarian cancer: no Is there a family history of uterine cancer diagnosed at age < 87, gastrointestinal cancer, urinary tract cancer, family member who is a Field seismologist syndrome-associated carrier: no Is the patient overweight and hypertensive, family history of diabetes, personal history of gestational diabetes, preeclampsia or PCOS: no Is patient over 42, have PCOS,  family history of premature CHD under age 72, diabetes, smoke, have hypertension or peripheral artery disease:  no At any time, has a partner hit, kicked or otherwise hurt or frightened you?: no Over the past 2 weeks, have you felt down, depressed or hopeless?: no Over the past 2 weeks, have you felt little interest or pleasure in doing things?:no   Gynecologic History Patient's last menstrual period was 10/27/2021 (exact date). Contraception: OCP (estrogen/progesterone) Last Pap: 2021. Results were: normal Last mammogram: July 2022. Results were: normal  Obstetric History OB History  Gravida Para Term Preterm AB Living  3 3 3     3   SAB IAB Ectopic Multiple Live Births          3    # Outcome Date GA Lbr Len/2nd Weight Sex Delivery Anes PTL Lv  3 Term 07/01/06 [redacted]w[redacted]d  F Vag-Spont EPI  LIV  2 Term 08/31/96 [id number] F Vag-Spont EPI  LIV  1 Term 04/21/90 [id number] Vag-Spont Gen  LIV    Past Medical History:  Diagnosis Date   Asthma    GERD (gastroesophageal reflux disease)    Hypersomnia with sleep apnea    Hypertension    Migraine    Rhinitis     Past Surgical History:  Procedure Laterality Date   CHOLECYSTECTOMY     TOOTH EXTRACTION       Current Outpatient Medications:    ciclopirox (LOPROX) 0.77 % cream, Apply topically 2 (two) times daily., Disp: 90 g, Rfl:  4   ibuprofen (ADVIL) 800 MG tablet, Take 1 tablet (800 mg total) by mouth every 8 (eight) hours as needed., Disp: 30 tablet, Rfl: 5   acetaminophen (TYLENOL 8 HOUR) 650 MG CR tablet, Take 1 tablet (650 mg total) by mouth every 8 (eight) hours as needed for pain., Disp: 15 tablet, Rfl: 0   clotrimazole-betamethasone (LOTRISONE) cream, APPLY TO AFFECTED AREA TWICE A DAY, Disp: 45 g, Rfl: 0   diclofenac (VOLTAREN) 75 MG EC tablet, Take 1 tablet (75 mg total) by mouth 2 (two) times daily as needed for mild pain., Disp: 180 tablet, Rfl: 0   fluticasone (FLONASE) 50 MCG/ACT nasal spray, Place 2 sprays into the nose daily., Disp: , Rfl:    levocetirizine (XYZAL) 5 MG tablet, TAKE 1 TABLET BY MOUTH EVERY DAY IN THE EVENING, Disp: 90 tablet, Rfl: 1   lisdexamfetamine (VYVANSE) 40 MG capsule, Take 1 capsule (40 mg total) by mouth every morning., Disp: 30 capsule, Rfl: 0   methimazole (TAPAZOLE) 10 MG tablet, Take 1.5 tablets (15 mg total) by mouth daily., Disp: 135 tablet, Rfl: 1   montelukast (SINGULAIR) 10 MG tablet, Take 1 tablet (10 mg total) by mouth at bedtime., Disp: 90 tablet, Rfl: 1   Multiple Vitamin (MULTIVITAMIN) tablet, Take 1 tablet by mouth daily., Disp: , Rfl:    norgestimate-ethinyl  estradiol (ORTHO-CYCLEN) 0.25-35 MG-MCG tablet, Take 1 tablet by mouth daily., Disp: 84 tablet, Rfl: 4   pantoprazole (PROTONIX) 40 MG tablet, Take 1 tablet (40 mg total) by mouth 2 (two) times daily., Disp: 180 tablet, Rfl: 1   Rimegepant Sulfate (NURTEC) 75 MG TBDP, Take 1 tablet by mouth daily as needed., Disp: 8 tablet, Rfl: 3   Semaglutide, 1 MG/DOSE, (OZEMPIC, 1 MG/DOSE,) 4 MG/3ML SOPN, Inject 1 mg into the skin once a week., Disp: 9 mL, Rfl: 0   solifenacin (VESICARE) 10 MG tablet, TAKE 1 TABLET BY MOUTH EVERY DAY, Disp: 90 tablet, Rfl: 1   spironolactone (ALDACTONE) 25 MG tablet, TAKE 1 TABLET (25 MG TOTAL) BY MOUTH DAILY., Disp: 90 tablet, Rfl: 0   TRINTELLIX 10 MG TABS tablet, Take 10 mg by mouth  daily., Disp: , Rfl:  Allergies  Allergen Reactions   Codeine Hives and Nausea And Vomiting   Tramadol Diarrhea and Nausea And Vomiting    Social History   Tobacco Use   Smoking status: Former    Packs/day: 0.10    Years: 1.00    Pack years: 0.10    Types: Cigarettes    Quit date: 10/09/1999    Years since quitting: 22.0   Smokeless tobacco: Never  Substance Use Topics   Alcohol use: Yes    Comment: 1 glass of white wine monthly    Family History  Problem Relation Age of Onset   Diabetes Father    Early death Father    Diabetes Mother    COPD Mother    Rheum arthritis Mother    Hyperlipidemia Brother    Thyroid disease Brother    Alcohol abuse Neg Hx    Cancer Neg Hx    Depression Neg Hx    Drug abuse Neg Hx    Hearing loss Neg Hx    Heart disease Neg Hx    Hypertension Neg Hx    Stroke Neg Hx       Review of Systems  Constitutional: negative for fatigue and weight loss Respiratory: negative for cough and wheezing Cardiovascular: negative for chest pain, fatigue and palpitations Gastrointestinal: negative for abdominal pain and change in bowel habits Musculoskeletal:negative for myalgias Neurological: negative for gait problems and tremors Behavioral/Psych: negative for abusive relationship, depression Endocrine: negative for temperature intolerance    Genitourinary:negative for abnormal menstrual periods, genital lesions, hot flashes, sexual problems and vaginal discharge Integument/breast: negative for breast lump, breast tenderness, nipple discharge and skin lesion(s)    Objective:       BP (!) 156/93    Pulse 76    Ht 5' 5"  (1.651 m)    Wt 279 lb (126.6 kg)    LMP 10/27/2021 (Exact Date)    BMI 46.43 kg/m  General:   alert  Skin:   no rash or abnormalities  Lungs:   clear to auscultation bilaterally  Heart:   regular rate and rhythm, S1, S2 normal, no murmur, click, rub or gallop  Breasts:   normal without suspicious masses, skin or nipple changes or  axillary nodes  Abdomen:  normal findings: no organomegaly, soft, non-tender and no hernia  Pelvis:  External genitalia: normal general appearance Urinary system: urethral meatus normal and bladder without fullness, nontender Vaginal: normal without tenderness, induration or masses Cervix: normal appearance Adnexa: normal bimanual exam Uterus: anteverted and non-tender, normal size   Lab Review Urine pregnancy test Labs reviewed yes Radiologic studies reviewed yes  I have spent a total of 30  minutes of face-to-face time, excluding clinical staff time, reviewing notes and preparing to see patient, ordering tests and/or medications, and counseling the patient.   Assessment:    1. Encounter for routine gynecological examination with Papanicolaou smear of cervix Rx: - Cytology - PAP( [place] [facility])  2. Severe dysmenorrhea Rx: - ibuprofen (ADVIL) 800 MG tablet; Take 1 tablet (800 mg total) by mouth every 8 (eight) hours as needed.  Dispense: 30 tablet; Refill: 5  3. Vaginal discharge Rx: - Cervicovaginal ancillary only( [place])  4. Superficial fungus infection of skin Rx: - ciclopirox (LOPROX) 0.77 % cream; Apply topically 2 (two) times daily.  Dispense: 90 g; Refill: 4  5. SUI (stress urinary incontinence, female) Rx: - Ambulatory referral to Urogynecology  6. Encounter for surveillance of contraceptive pills Rx: - norgestimate-ethinyl estradiol (ORTHO-CYCLEN) 0.25-35 MG-MCG tablet; Take 1 tablet by mouth daily.  Dispense: 84 tablet; Refill: 4  7. Class 3 severe obesity due to excess calories without serious comorbidity with body mass index (BMI) of 45.0 to 49.9 in adult [facility])     Plan:    Education reviewed: calcium supplements, depression evaluation, low fat, low cholesterol diet, safe sex/STD prevention, self breast exams, and weight bearing exercise. Contraception: OCP (estrogen/progesterone). Follow up in: 1 year.   Meds ordered this encounter  Medications    norgestimate-ethinyl estradiol (ORTHO-CYCLEN) 0.25-35 MG-MCG tablet    Sig: Take 1 tablet by mouth daily.    Dispense:  84 tablet    Refill:  4   ciclopirox (LOPROX) 0.77 % cream    Sig: Apply topically 2 (two) times daily.    Dispense:  90 g    Refill:  4   ibuprofen (ADVIL) 800 MG tablet    Sig: Take 1 tablet (800 mg total) by mouth every 8 (eight) hours as needed.    Dispense:  30 tablet    Refill:  5   Orders Placed This Encounter  Procedures   Ambulatory referral to Urogynecology    Referral Priority:   Routine    Referral Type:   Consultation    Referral Reason:   Specialty Services Required    Requested Specialty:   Urology    Number of Visits Requested:   1     Shelly Bombard, MD 11/01/2021 11:11 AM

## 2021-11-01 NOTE — Progress Notes (Signed)
GYN presents for AEX/PAP/STD screening. Last Mammogram 05/05/21. C/o rash in groin.

## 2021-11-02 LAB — CERVICOVAGINAL ANCILLARY ONLY
Bacterial Vaginitis (gardnerella): POSITIVE — AB
Candida Glabrata: NEGATIVE
Candida Vaginitis: NEGATIVE
Chlamydia: NEGATIVE
Comment: NEGATIVE
Comment: NEGATIVE
Comment: NEGATIVE
Comment: NEGATIVE
Comment: NEGATIVE
Comment: NORMAL
Neisseria Gonorrhea: NEGATIVE
Trichomonas: NEGATIVE

## 2021-11-03 ENCOUNTER — Other Ambulatory Visit: Payer: Self-pay | Admitting: Obstetrics

## 2021-11-03 DIAGNOSIS — N760 Acute vaginitis: Secondary | ICD-10-CM

## 2021-11-03 DIAGNOSIS — B9689 Other specified bacterial agents as the cause of diseases classified elsewhere: Secondary | ICD-10-CM

## 2021-11-03 LAB — CYTOLOGY - PAP
Comment: NEGATIVE
Diagnosis: NEGATIVE
Diagnosis: REACTIVE
High risk HPV: NEGATIVE

## 2021-11-03 MED ORDER — METRONIDAZOLE 500 MG PO TABS: 500.0000 mg | ORAL_TABLET | Freq: Two times a day (BID) | ORAL | 2 refills | Status: DC

## 2021-11-09 ENCOUNTER — Other Ambulatory Visit: Payer: Self-pay | Admitting: Internal Medicine

## 2021-11-09 DIAGNOSIS — F5081 Binge eating disorder: Secondary | ICD-10-CM

## 2021-11-09 MED ORDER — LISDEXAMFETAMINE DIMESYLATE 40 MG PO CAPS: 40.0000 mg | ORAL_CAPSULE | ORAL | 0 refills | Status: DC

## 2021-12-04 ENCOUNTER — Encounter: Payer: Self-pay | Admitting: Internal Medicine

## 2021-12-06 ENCOUNTER — Ambulatory Visit: Payer: BC Managed Care – PPO | Admitting: Obstetrics and Gynecology

## 2021-12-07 ENCOUNTER — Encounter: Payer: Self-pay | Admitting: Internal Medicine

## 2021-12-07 ENCOUNTER — Ambulatory Visit: Payer: BC Managed Care – PPO | Admitting: Internal Medicine

## 2021-12-07 ENCOUNTER — Other Ambulatory Visit: Payer: Self-pay | Admitting: Internal Medicine

## 2021-12-07 ENCOUNTER — Ambulatory Visit (INDEPENDENT_AMBULATORY_CARE_PROVIDER_SITE_OTHER): Payer: BC Managed Care – PPO

## 2021-12-07 ENCOUNTER — Other Ambulatory Visit: Payer: Self-pay

## 2021-12-07 VITALS — BP 140/88 | Temp 98.7°F | Ht 65.0 in | Wt 279.0 lb

## 2021-12-07 DIAGNOSIS — M170 Bilateral primary osteoarthritis of knee: Secondary | ICD-10-CM

## 2021-12-07 DIAGNOSIS — F5081 Binge eating disorder: Secondary | ICD-10-CM | POA: Diagnosis not present

## 2021-12-07 DIAGNOSIS — G8929 Other chronic pain: Secondary | ICD-10-CM

## 2021-12-07 DIAGNOSIS — M1612 Unilateral primary osteoarthritis, left hip: Secondary | ICD-10-CM

## 2021-12-07 DIAGNOSIS — M25552 Pain in left hip: Secondary | ICD-10-CM

## 2021-12-07 MED ORDER — LISDEXAMFETAMINE DIMESYLATE 40 MG PO CAPS: 40.0000 mg | ORAL_CAPSULE | ORAL | 0 refills | Status: DC

## 2021-12-07 MED ORDER — SEGLENTIS 56-44 MG PO TABS: 2.0000 | ORAL_TABLET | Freq: Two times a day (BID) | ORAL | 1 refills | Status: DC | PRN

## 2021-12-07 NOTE — Progress Notes (Signed)
? ?Subjective:  ?Patient ID: Alicia Hoover, female    DOB: 03/09/1976  Age: 46 y.o. MRN: [id number] ? ?CC: Osteoarthritis ? ?This visit occurred during the SARS-CoV-2 public health emergency.  Safety protocols were in place, including screening questions prior to the visit, additional usage of staff PPE, and extensive cleaning of exam room while observing appropriate contact time as indicated for disinfecting solutions.   ? ?HPI ?Alicia Hoover presents for f/up - ? ?She complains of chronic L hip pain worsening over the last 3 months. There is no history of T/I. She is not getting much symptom relief with ibuprofen. ? ?Outpatient Medications Prior to Visit  ?Medication Sig Dispense Refill  ? acetaminophen (TYLENOL 8 HOUR) 650 MG CR tablet Take 1 tablet (650 mg total) by mouth every 8 (eight) hours as needed for pain. 15 tablet 0  ? ciclopirox (LOPROX) 0.77 % cream Apply topically 2 (two) times daily. 90 g 4  ? clotrimazole-betamethasone (LOTRISONE) cream APPLY TO AFFECTED AREA TWICE A DAY 45 g 0  ? fluticasone (FLONASE) 50 MCG/ACT nasal spray Place 2 sprays into the nose daily.    ? levocetirizine (XYZAL) 5 MG tablet TAKE 1 TABLET BY MOUTH EVERY DAY IN THE EVENING 90 tablet 1  ? methimazole (TAPAZOLE) 10 MG tablet Take 1.5 tablets (15 mg total) by mouth daily. 135 tablet 1  ? metroNIDAZOLE (FLAGYL) 500 MG tablet Take 1 tablet (500 mg total) by mouth 2 (two) times daily. 14 tablet 2  ? montelukast (SINGULAIR) 10 MG tablet Take 1 tablet (10 mg total) by mouth at bedtime. 90 tablet 1  ? Multiple Vitamin (MULTIVITAMIN) tablet Take 1 tablet by mouth daily.    ? norgestimate-ethinyl estradiol (ORTHO-CYCLEN) 0.25-35 MG-MCG tablet Take 1 tablet by mouth daily. 84 tablet 4  ? NURTEC 75 MG TBDP TAKE 1 TABLET BY MOUTH EVERY DAY AS NEEDED 8 tablet 3  ? pantoprazole (PROTONIX) 40 MG tablet Take 1 tablet (40 mg total) by mouth 2 (two) times daily. 180 tablet 1  ? Semaglutide, 1 MG/DOSE, (OZEMPIC, 1 MG/DOSE,) 4 MG/3ML SOPN  Inject 1 mg into the skin once a week. 9 mL 0  ? solifenacin (VESICARE) 10 MG tablet TAKE 1 TABLET BY MOUTH EVERY DAY 90 tablet 1  ? spironolactone (ALDACTONE) 25 MG tablet TAKE 1 TABLET (25 MG TOTAL) BY MOUTH DAILY. 90 tablet 0  ? diclofenac (VOLTAREN) 75 MG EC tablet Take 1 tablet (75 mg total) by mouth 2 (two) times daily as needed for mild pain. 180 tablet 0  ? ibuprofen (ADVIL) 800 MG tablet Take 1 tablet (800 mg total) by mouth every 8 (eight) hours as needed. 30 tablet 5  ? lisdexamfetamine (VYVANSE) 40 MG capsule Take 1 capsule (40 mg total) by mouth every morning. 30 capsule 0  ? TRINTELLIX 10 MG TABS tablet Take 10 mg by mouth daily.    ? ?No facility-administered medications prior to visit.  ? ? ?ROS ?Review of Systems  ?Constitutional:  Negative for diaphoresis and fatigue.  ?HENT: Negative.    ?Eyes: Negative.   ?Respiratory:  Negative for cough, chest tightness, shortness of breath and wheezing.   ?Cardiovascular:  Negative for chest pain, palpitations and leg swelling.  ?Gastrointestinal:  Negative for abdominal pain, constipation, diarrhea, nausea and vomiting.  ?Endocrine: Negative.   ?Genitourinary: Negative.  Negative for difficulty urinating and dysuria.  ?Musculoskeletal:  Positive for arthralgias. Negative for back pain and myalgias.  ?Skin: Negative.  Negative for color change.  ?Neurological:  Negative for dizziness, weakness, light-headedness and headaches.  ?Hematological:  Negative for adenopathy. Does not bruise/bleed easily.  ?Psychiatric/Behavioral: Negative.    ? ?Objective:  ?BP 140/88 (BP Location: Left Arm, Patient Position: Sitting, Cuff Size: Large)   Temp 98.7 ?F (37.1 ?C) (Oral)   Ht 5' 5"  (1.651 m)   Wt 279 lb (126.6 kg)   LMP 11/23/2021 (Approximate)   BMI 46.43 kg/m?  ? ?BP Readings from Last 3 Encounters:  ?12/07/21 140/88  ?11/01/21 (!) 156/93  ?10/19/21 120/74  ? ? ?Wt Readings from Last 3 Encounters:  ?12/07/21 279 lb (126.6 kg)  ?11/01/21 279 lb (126.6 kg)   ?10/19/21 280 lb (127 kg)  ? ? ?Physical Exam ?Vitals reviewed.  ?HENT:  ?   Mouth/Throat:  ?   Mouth: Mucous membranes are moist.  ?Eyes:  ?   General: No scleral icterus. ?   Conjunctiva/sclera: Conjunctivae normal.  ?Cardiovascular:  ?   Rate and Rhythm: Normal rate and regular rhythm.  ?   Heart sounds: No murmur heard. ?Pulmonary:  ?   Effort: Pulmonary effort is normal.  ?   Breath sounds: No stridor. No wheezing, rhonchi or rales.  ?Abdominal:  ?   General: Abdomen is protuberant. Bowel sounds are normal. There is no distension.  ?   Palpations: Abdomen is soft. There is no hepatomegaly, splenomegaly or mass.  ?   Tenderness: There is no abdominal tenderness.  ?Musculoskeletal:     ?   General: Normal range of motion.  ?   Cervical back: Neck supple.  ?   Right hip: Normal.  ?   Left hip: Normal. No deformity, tenderness or bony tenderness. Normal range of motion.  ?   Right lower leg: No edema.  ?   Left lower leg: No edema.  ?Lymphadenopathy:  ?   Cervical: No cervical adenopathy.  ?Skin: ?   General: Skin is warm and dry.  ?Neurological:  ?   General: No focal deficit present.  ?   Mental Status: She is alert.  ?Psychiatric:     ?   Mood and Affect: Mood normal.     ?   Behavior: Behavior normal.  ? ? ?Lab Results  ?Component Value Date  ? WBC 8.6 10/05/2021  ? HGB 13.0 10/05/2021  ? HCT 40.6 10/05/2021  ? PLT 365.0 10/05/2021  ? GLUCOSE 76 10/05/2021  ? CHOL 138 10/05/2021  ? TRIG 140.0 10/05/2021  ? HDL 35.80 (L) 10/05/2021  ? LDLDIRECT 57.0 03/27/2017  ? [place] 74 10/05/2021  ? ALT 15 10/05/2021  ? AST 16 10/05/2021  ? NA 138 10/05/2021  ? K 3.9 10/05/2021  ? CL 102 10/05/2021  ? CREATININE 0.88 10/05/2021  ? BUN 14 10/05/2021  ? CO2 30 10/05/2021  ? TSH 2.70 10/05/2021  ? HGBA1C 5.9 02/14/2021  ? ? ?DG HIP UNILAT WITH PELVIS 2-3 VIEWS LEFT ? ?Result Date: 12/08/2021 ?CLINICAL DATA:  Worsening chronic, atraumatic left hip pain. EXAM: DG HIP (WITH OR WITHOUT PELVIS) 2-3V LEFT COMPARISON:  None. FINDINGS:  There is no evidence of hip fracture or dislocation. There is no evidence of arthropathy or other focal bone abnormality. IMPRESSION: Negative. Electronically Signed   By: Virgina Norfolk M.D.   On: 12/08/2021 23:51    ? ?Assessment & Plan:  ? ?Rozalynn was seen today for osteoarthritis. ? ?Diagnoses and all orders for this visit: ? ?Primary osteoarthritis of both knees- Will improved pain control with tramadol and celecoxib. ?-     Celecoxib-traMADol  HCl (SEGLENTIS) 56-44 MG TABS; Take 2 tablets by mouth 2 (two) times daily as needed. ? ?Binge-eating disorder, moderate ?-     lisdexamfetamine (VYVANSE) 40 MG capsule; Take 1 capsule (40 mg total) by mouth every morning. ? ?Primary osteoarthritis of left hip ?-     Cancel: DG HIP UNILAT WITH PELVIS MIN 4 VIEWS LEFT; Future ?-     Celecoxib-traMADol HCl (SEGLENTIS) 56-44 MG TABS; Take 2 tablets by mouth 2 (two) times daily as needed. ? ?Chronic left hip pain- XRay is normal. Will treat for OA. ?-     Cancel: DG HIP UNILAT WITH PELVIS MIN 4 VIEWS LEFT; Future ? ? ?I have discontinued Suleima L. Raso's Trintellix, diclofenac, and ibuprofen. I am also having her start on Seglentis. Additionally, I am having her maintain her fluticasone, multivitamin, acetaminophen, montelukast, pantoprazole, Ozempic (1 MG/DOSE), solifenacin, levocetirizine, clotrimazole-betamethasone, methimazole, norgestimate-ethinyl estradiol, ciclopirox, spironolactone, Nurtec, metroNIDAZOLE, and lisdexamfetamine. ? ?Meds ordered this encounter  ?Medications  ? lisdexamfetamine (VYVANSE) 40 MG capsule  ?  Sig: Take 1 capsule (40 mg total) by mouth every morning.  ?  Dispense:  30 capsule  ?  Refill:  0  ? Celecoxib-traMADol HCl (SEGLENTIS) 56-44 MG TABS  ?  Sig: Take 2 tablets by mouth 2 (two) times daily as needed.  ?  Dispense:  60 tablet  ?  Refill:  1  ? ? ? ?Follow-up: Return in about 6 months (around 06/09/2022). ? ?Alicia Calico, MD ?

## 2021-12-07 NOTE — Patient Instructions (Signed)
Osteoarthritis Osteoarthritis is a type of arthritis. It refers to joint pain or joint disease. Osteoarthritis affects tissue that covers the ends of bones in joints (cartilage). Cartilage acts as a cushion between the bones and helps them move smoothly. Osteoarthritis occurs when cartilage in the joints gets worn down. Osteoarthritis is sometimes called "wear and tear" arthritis. Osteoarthritis is the most common form of arthritis. It often occurs in older people. It is a condition that gets worse over time. The joints most often affected by this condition are in the fingers, toes, hips, knees, and spine, including the neck and lower back. What are the causes? This condition is caused by the wearing down of cartilage that covers the ends of bones. What increases the risk? The following factors may make you more likely to develop this condition: Being age 50 or older. Obesity. Overuse of joints. Past injury of a joint. Past surgery on a joint. Family history of osteoarthritis. What are the signs or symptoms? The main symptoms of this condition are pain, swelling, and stiffness in the joint. Other symptoms may include: An enlarged joint. More pain and further damage caused by small pieces of bone or cartilage that break off and float inside of the joint. Small deposits of bone (osteophytes) that grow on the edges of the joint. A grating or scraping feeling inside the joint when you move it. Popping or creaking sounds when you move. Difficulty walking or exercising. An inability to grip items, twist your hand(s), or control the movements of your hands and fingers. How is this diagnosed? This condition may be diagnosed based on: Your medical history. A physical exam. Your symptoms. X-rays of the affected joint(s). Blood tests to rule out other types of arthritis. How is this treated? There is no cure for this condition, but treatment can help control pain and improve joint function.  Treatment may include a combination of therapies, such as: Pain relief techniques, such as: Applying heat and cold to the joint. Massage. A form of talk therapy called cognitive behavioral therapy (CBT). This therapy helps you set goals and follow up on the changes that you make. Medicines for pain and inflammation. The medicines can be taken by mouth or applied to the skin. They include: NSAIDs, such as ibuprofen. Prescription medicines. Strong anti-inflammatory medicines (corticosteroids). Certain nutritional supplements. A prescribed exercise program. You may work with a physical therapist. Assistive devices, such as a brace, wrap, splint, specialized glove, or cane. A weight control plan. Surgery, such as: An osteotomy. This is done to reposition the bones and relieve pain or to remove loose pieces of bone and cartilage. Joint replacement surgery. You may need this surgery if you have advanced osteoarthritis. Follow these instructions at home: Activity Rest your affected joints as told by your health care provider. Exercise as told by your health care provider. He or she may recommend specific types of exercise, such as: Strengthening exercises. These are done to strengthen the muscles that support joints affected by arthritis. Aerobic activities. These are exercises, such as brisk walking or water aerobics, that increase your heart rate. Range-of-motion activities. These help your joints move more easily. Balance and agility exercises. Managing pain, stiffness, and swelling   If directed, apply heat to the affected area as often as told by your health care provider. Use the heat source that your health care provider recommends, such as a moist heat pack or a heating pad. If you have a removable assistive device, remove it as told by   your health care provider. Place a towel between your skin and the heat source. If your health care provider tells you to keep the assistive device on  while you apply heat, place a towel between the assistive device and the heat source. Leave the heat on for 20-30 minutes. Remove the heat if your skin turns bright red. This is especially important if you are unable to feel pain, heat, or cold. You may have a greater risk of getting burned. If directed, put ice on the affected area. To do this: If you have a removable assistive device, remove it as told by your health care provider. Put ice in a plastic bag. Place a towel between your skin and the bag. If your health care provider tells you to keep the assistive device on during icing, place a towel between the assistive device and the bag. Leave the ice on for 20 minutes, 2-3 times a day. Move your fingers or toes often to reduce stiffness and swelling. Raise (elevate) the injured area above the level of your heart while you are sitting or lying down. General instructions Take over-the-counter and prescription medicines only as told by your health care provider. Maintain a healthy weight. Follow instructions from your health care provider for weight control. Do not use any products that contain nicotine or tobacco, such as cigarettes, e-cigarettes, and chewing tobacco. If you need help quitting, ask your health care provider. Use assistive devices as told by your health care provider. Keep all follow-up visits as told by your health care provider. This is important. Where to find more information National Institute of Arthritis and Musculoskeletal and Skin Diseases: www.niams.nih.gov National Institute on Aging: www.nia.nih.gov American College of Rheumatology: www.rheumatology.org Contact a health care provider if: You have redness, swelling, or a feeling of warmth in a joint that gets worse. You have a fever along with joint or muscle aches. You develop a rash. You have trouble doing your normal activities. Get help right away if: You have pain that gets worse and is not relieved by  pain medicine. Summary Osteoarthritis is a type of arthritis that affects tissue covering the ends of bones in joints (cartilage). This condition is caused by the wearing down of cartilage that covers the ends of bones. The main symptom of this condition is pain, swelling, and stiffness in the joint. There is no cure for this condition, but treatment can help control pain and improve joint function. This information is not intended to replace advice given to you by your health care provider. Make sure you discuss any questions you have with your health care provider. Document Revised: 09/21/2019 Document Reviewed: 09/21/2019 Elsevier Patient Education  2022 [employer] Inc.  

## 2021-12-10 ENCOUNTER — Encounter: Payer: Self-pay | Admitting: Internal Medicine

## 2021-12-11 NOTE — Addendum Note (Signed)
Addended by: [name] on: 12/11/2021 10:58 AM ? ? Modules accepted: Orders ? ?

## 2021-12-13 ENCOUNTER — Other Ambulatory Visit: Payer: Self-pay | Admitting: Internal Medicine

## 2021-12-13 DIAGNOSIS — R7303 Prediabetes: Secondary | ICD-10-CM

## 2021-12-13 DIAGNOSIS — K2100 Gastro-esophageal reflux disease with esophagitis, without bleeding: Secondary | ICD-10-CM

## 2021-12-13 DIAGNOSIS — J301 Allergic rhinitis due to pollen: Secondary | ICD-10-CM

## 2021-12-13 DIAGNOSIS — R21 Rash and other nonspecific skin eruption: Secondary | ICD-10-CM

## 2021-12-14 ENCOUNTER — Other Ambulatory Visit: Payer: Self-pay | Admitting: Internal Medicine

## 2021-12-14 DIAGNOSIS — J301 Allergic rhinitis due to pollen: Secondary | ICD-10-CM

## 2021-12-14 DIAGNOSIS — K2100 Gastro-esophageal reflux disease with esophagitis, without bleeding: Secondary | ICD-10-CM

## 2021-12-14 DIAGNOSIS — R7303 Prediabetes: Secondary | ICD-10-CM

## 2021-12-14 MED ORDER — OZEMPIC (1 MG/DOSE) 4 MG/3ML ~~LOC~~ SOPN: 1.0000 mg | PEN_INJECTOR | SUBCUTANEOUS | 0 refills | Status: DC

## 2021-12-14 MED ORDER — MONTELUKAST SODIUM 10 MG PO TABS
10.0000 mg | ORAL_TABLET | Freq: Every day | ORAL | 1 refills | Status: DC
Start: 2021-12-14 — End: 2022-06-11

## 2021-12-14 MED ORDER — PANTOPRAZOLE SODIUM 40 MG PO TBEC: 40.0000 mg | DELAYED_RELEASE_TABLET | Freq: Two times a day (BID) | ORAL | 1 refills | Status: DC

## 2021-12-17 ENCOUNTER — Other Ambulatory Visit: Payer: Self-pay | Admitting: Internal Medicine

## 2021-12-17 DIAGNOSIS — R7303 Prediabetes: Secondary | ICD-10-CM

## 2021-12-17 DIAGNOSIS — F5081 Binge eating disorder: Secondary | ICD-10-CM

## 2021-12-18 ENCOUNTER — Encounter: Payer: Self-pay | Admitting: Internal Medicine

## 2021-12-18 MED ORDER — LISDEXAMFETAMINE DIMESYLATE 40 MG PO CAPS: 40.0000 mg | ORAL_CAPSULE | ORAL | 0 refills | Status: DC

## 2021-12-22 ENCOUNTER — Encounter: Payer: Self-pay | Admitting: Internal Medicine

## 2021-12-22 ENCOUNTER — Ambulatory Visit: Payer: BC Managed Care – PPO | Admitting: Obstetrics and Gynecology

## 2022-01-01 ENCOUNTER — Encounter: Payer: Self-pay | Admitting: Internal Medicine

## 2022-01-02 ENCOUNTER — Other Ambulatory Visit: Payer: Self-pay | Admitting: Internal Medicine

## 2022-01-02 DIAGNOSIS — G8929 Other chronic pain: Secondary | ICD-10-CM

## 2022-01-02 DIAGNOSIS — M1612 Unilateral primary osteoarthritis, left hip: Secondary | ICD-10-CM

## 2022-01-04 ENCOUNTER — Telehealth: Payer: Self-pay | Admitting: Internal Medicine

## 2022-01-04 NOTE — Telephone Encounter (Signed)
Connected to [facility] 3.4.2023. ? ?-Caller states she was prescribed Seglentis- states she took her first dose last night with food- states she woke up with nausea and has vomited once. Caller states she has never been able to take Tramadol in the past and ?this drug contains tramadol.  ? ?Advised to call PCP within 24 hours.  ?

## 2022-01-05 ENCOUNTER — Other Ambulatory Visit: Payer: Self-pay | Admitting: Internal Medicine

## 2022-01-15 ENCOUNTER — Other Ambulatory Visit: Payer: Self-pay | Admitting: Internal Medicine

## 2022-01-15 ENCOUNTER — Telehealth: Payer: Self-pay | Admitting: Internal Medicine

## 2022-01-15 DIAGNOSIS — F5081 Binge eating disorder: Secondary | ICD-10-CM

## 2022-01-15 MED ORDER — LISDEXAMFETAMINE DIMESYLATE 40 MG PO CAPS
40.0000 mg | ORAL_CAPSULE | ORAL | 0 refills | Status: DC
Start: 2022-01-15 — End: 2022-02-19

## 2022-01-15 NOTE — Telephone Encounter (Signed)
Pt called in and states she was told to get an MRI done, but there are no orders.  ? ?Please send in orders to schedule appt.  ?

## 2022-01-18 ENCOUNTER — Other Ambulatory Visit: Payer: Self-pay | Admitting: Internal Medicine

## 2022-01-18 DIAGNOSIS — I10 Essential (primary) hypertension: Secondary | ICD-10-CM

## 2022-01-18 DIAGNOSIS — E876 Hypokalemia: Secondary | ICD-10-CM

## 2022-01-21 ENCOUNTER — Ambulatory Visit
Admission: RE | Admit: 2022-01-21 | Discharge: 2022-01-21 | Disposition: A | Discharge status: Home / Self Care | Payer: BC Managed Care – PPO | Source: Ambulatory Visit | Attending: Internal Medicine | Admitting: Internal Medicine

## 2022-01-21 DIAGNOSIS — G8929 Other chronic pain: Secondary | ICD-10-CM

## 2022-01-21 DIAGNOSIS — M1612 Unilateral primary osteoarthritis, left hip: Secondary | ICD-10-CM

## 2022-01-22 ENCOUNTER — Other Ambulatory Visit: Payer: Self-pay | Admitting: Internal Medicine

## 2022-01-22 DIAGNOSIS — M84352A Stress fracture, left femur, initial encounter for fracture: Secondary | ICD-10-CM | POA: Insufficient documentation

## 2022-01-25 ENCOUNTER — Other Ambulatory Visit: Payer: BC Managed Care – PPO

## 2022-01-26 ENCOUNTER — Telehealth: Payer: Self-pay | Admitting: Internal Medicine

## 2022-01-26 NOTE — Telephone Encounter (Signed)
Pt requesting a cb w/ a status update on ortho referral placed 01-22-2022 ?

## 2022-02-19 ENCOUNTER — Other Ambulatory Visit: Payer: Self-pay | Admitting: Internal Medicine

## 2022-02-19 DIAGNOSIS — F5081 Binge eating disorder: Secondary | ICD-10-CM

## 2022-02-19 MED ORDER — LISDEXAMFETAMINE DIMESYLATE 40 MG PO CAPS: 40.0000 mg | ORAL_CAPSULE | ORAL | 0 refills | Status: DC

## 2022-02-19 NOTE — Telephone Encounter (Signed)
LOV: 12/07/21 ?

## 2022-02-20 ENCOUNTER — Encounter: Payer: Self-pay | Admitting: Orthopaedic Surgery

## 2022-02-20 ENCOUNTER — Ambulatory Visit: Payer: BC Managed Care – PPO | Admitting: Orthopaedic Surgery

## 2022-02-20 VITALS — Ht 65.5 in | Wt 288.2 lb

## 2022-02-20 DIAGNOSIS — M1612 Unilateral primary osteoarthritis, left hip: Secondary | ICD-10-CM

## 2022-02-20 DIAGNOSIS — Z6841 Body Mass Index (BMI) 40.0 and over, adult: Secondary | ICD-10-CM | POA: Insufficient documentation

## 2022-02-20 NOTE — Progress Notes (Signed)
? ?Office Visit Note ?  ?Patient: Alicia Hoover           ?Date of Birth: [birth year]           ?MRN: [id number] ?Visit Date: 02/20/2022 ?             ?Requested by: Alicia Lima, MD ?[place],  [place] [id number] ?PCP: Alicia Lima, MD ? ? ?Assessment & Plan: ?Visit Diagnoses:  ?1. Primary osteoarthritis of left hip   ?2. Body mass index 45.0-49.9, adult ([place])   ?3. Morbid obesity ([place])   ? ? ?Plan: MRI shows stress reaction of the acetabulum as well as degenerative joint disease.  I do not feel that this is a true stress fracture but rather a reaction to the degenerative joint disease.  We talked about the nonsurgical ways of managing hip arthritis including weight loss, physical therapy, cortisone injection.  Ultimately I think that she will likely need a hip replacement.  I made a referral to [facility] for aquatic therapy.  We will make referral to Dr. Ernestina Hoover for cortisone injection. ? ?The patient meets the AMA guidelines for Morbid (severe) obesity with a BMI > 40.0 and I have recommended weight loss. ? ?Follow-Up Instructions: No follow-ups on file.  ? ?Orders:  ?Orders Placed This Encounter  ?Procedures  ? Ambulatory referral to Physical Medicine Rehab  ? ?No orders of the defined types were placed in this encounter. ? ? ? ? Procedures: ?No procedures performed ? ? ?Clinical Data: ?No additional findings. ? ? ?Subjective: ?Chief Complaint  ?Patient presents with  ? Left Hip - Pain  ? ? ?HPI ? ?Alicia Hoover is a 47 year old female referral from PCP for chronic left hip pain.  Recently had an MRI which showed a stress reaction of the acetabulum.  She has had pain for a year of insidious onset.  Has lost about 30 pounds in the last 6 to 8 months.  Pain is sometimes 8 out of 10.  Reports start of stiffness in the morning.  Pain is constant in the groin and feels like it wants to pop out of place. ? ?Review of Systems  ?Constitutional: Negative.   ?HENT: Negative.    ?Eyes:  Negative.   ?Respiratory: Negative.    ?Cardiovascular: Negative.   ?Endocrine: Negative.   ?Musculoskeletal: Negative.   ?Neurological: Negative.   ?Hematological: Negative.   ?Psychiatric/Behavioral: Negative.    ?All other systems reviewed and are negative. ? ? ?Objective: ?Vital Signs: Ht 5' 5.5" (1.664 m)   Wt 288 lb 3.2 oz (130.7 kg)   BMI 47.23 kg/m?  ? ?Physical Exam ?Vitals and nursing note reviewed.  ?Constitutional:   ?   Appearance: She is well-developed.  ?HENT:  ?   Head: Normocephalic and atraumatic.  ?Pulmonary:  ?   Effort: Pulmonary effort is normal.  ?Abdominal:  ?   Palpations: Abdomen is soft.  ?Musculoskeletal:  ?   Cervical back: Neck supple.  ?Skin: ?   General: Skin is warm.  ?   Capillary Refill: Capillary refill takes less than 2 seconds.  ?Neurological:  ?   Mental Status: She is alert and oriented to person, place, and time.  ?Psychiatric:     ?   Behavior: Behavior normal.     ?   Thought Content: Thought content normal.     ?   Judgment: Judgment normal.  ? ? ?Ortho Exam ? ?Exam of the left hip shows no significant  limitation range of motion.  There is some mild pain towards the extremes of flexion and internal rotation.  No sciatic tension signs.  No tenderness palpation. ? ?Specialty Comments:  ?No specialty comments available. ? ?Imaging: ?No results found. ? ? ?PMFS History: ?Patient Active Problem List  ? Diagnosis Date Noted  ? Body mass index 45.0-49.9, adult ([place]) 02/20/2022  ? Stress fracture of left hip 01/22/2022  ? Primary osteoarthritis of left hip 12/07/2021  ? Primary osteoarthritis of both knees 12/07/2021  ? Chronic left hip pain 12/07/2021  ? Hypokalemia 10/19/2021  ? Hyperthyroidism 10/19/2021  ? Encounter for general adult medical examination with abnormal findings 10/05/2021  ? Screen for colon cancer 10/05/2021  ? GERD (gastroesophageal reflux disease)   ? Binge-eating disorder, moderate 08/18/2020  ? Migraine without aura and without status migrainosus, not  intractable 08/18/2020  ? Seasonal allergic rhinitis due to pollen 04/12/2020  ? Graves disease 10/12/2019  ? OAB (overactive bladder) 04/08/2019  ? Depression with anxiety 04/08/2019  ? Visit for screening mammogram 03/27/2017  ? Prediabetes 03/08/2016  ? Eczema, allergic 03/08/2016  ? Essential hypertension, benign 02/24/2014  ? Morbid obesity ([place]) 11/01/2010  ? HYPERSOMNIA WITH SLEEP APNEA UNSPECIFIED 10/16/2010  ? ?Past Medical History:  ?Diagnosis Date  ? Asthma   ? GERD (gastroesophageal reflux disease)   ? Hypersomnia with sleep apnea   ? Hypertension   ? Migraine   ? Rhinitis   ?  ?Family History  ?Problem Relation Age of Onset  ? Diabetes Father   ? Early death Father   ? Diabetes Mother   ? COPD Mother   ? Rheum arthritis Mother   ? Hyperlipidemia Brother   ? Thyroid disease Brother   ? Alcohol abuse Neg Hx   ? Cancer Neg Hx   ? Depression Neg Hx   ? Drug abuse Neg Hx   ? Hearing loss Neg Hx   ? Heart disease Neg Hx   ? Hypertension Neg Hx   ? Stroke Neg Hx   ?  ?Past Surgical History:  ?Procedure Laterality Date  ? CHOLECYSTECTOMY    ? TOOTH EXTRACTION    ? ?Social History  ? ?Occupational History  ? Occupation: [employer]  ?  Employer: [employer]  ?Tobacco Use  ? Smoking status: Former  ?  Packs/day: 0.10  ?  Years: 1.00  ?  Pack years: 0.10  ?  Types: Cigarettes  ?  Quit date: 10/09/1999  ?  Years since quitting: 22.3  ? Smokeless tobacco: Never  ?Substance and Sexual Activity  ? Alcohol use: Yes  ?  Comment: 1 glass of white wine monthly  ? Drug use: No  ? Sexual activity: Yes  ?  Birth control/protection: Pill  ? ? ? ? ? ? ?

## 2022-03-08 ENCOUNTER — Other Ambulatory Visit: Payer: Self-pay | Admitting: Internal Medicine

## 2022-03-08 DIAGNOSIS — R7303 Prediabetes: Secondary | ICD-10-CM

## 2022-03-09 ENCOUNTER — Other Ambulatory Visit: Payer: Self-pay | Admitting: Internal Medicine

## 2022-03-23 ENCOUNTER — Ambulatory Visit: Payer: BC Managed Care – PPO | Admitting: Internal Medicine

## 2022-03-26 ENCOUNTER — Other Ambulatory Visit: Payer: Self-pay | Admitting: Internal Medicine

## 2022-03-26 DIAGNOSIS — F5081 Binge eating disorder: Secondary | ICD-10-CM

## 2022-03-26 MED ORDER — LISDEXAMFETAMINE DIMESYLATE 40 MG PO CAPS: 40.0000 mg | ORAL_CAPSULE | ORAL | 0 refills | Status: DC

## 2022-03-27 ENCOUNTER — Telehealth: Payer: Self-pay | Admitting: Physical Medicine and Rehabilitation

## 2022-03-27 NOTE — Telephone Encounter (Signed)
Pt called to cancel appt. No need for call back. Pt phone number is [phone].

## 2022-03-28 ENCOUNTER — Encounter: Payer: BC Managed Care – PPO | Admitting: Physical Medicine and Rehabilitation

## 2022-03-30 ENCOUNTER — Ambulatory Visit: Payer: BC Managed Care – PPO | Admitting: Obstetrics and Gynecology

## 2022-03-30 ENCOUNTER — Encounter: Payer: Self-pay | Admitting: Obstetrics and Gynecology

## 2022-03-30 VITALS — BP 137/80 | HR 77 | Ht 65.0 in | Wt 288.0 lb

## 2022-03-30 DIAGNOSIS — R350 Frequency of micturition: Secondary | ICD-10-CM | POA: Diagnosis not present

## 2022-03-30 DIAGNOSIS — N3281 Overactive bladder: Secondary | ICD-10-CM

## 2022-03-30 DIAGNOSIS — K5904 Chronic idiopathic constipation: Secondary | ICD-10-CM

## 2022-03-30 DIAGNOSIS — N393 Stress incontinence (female) (male): Secondary | ICD-10-CM

## 2022-03-30 LAB — POCT URINALYSIS DIPSTICK
Appearance: ABNORMAL
Bilirubin, UA: NEGATIVE
Blood, UA: NEGATIVE
Glucose, UA: NEGATIVE
Ketones, UA: NEGATIVE
Leukocytes, UA: NEGATIVE
Nitrite, UA: NEGATIVE
Protein, UA: NEGATIVE
Spec Grav, UA: 1.02 (ref 1.010–1.025)
Urobilinogen, UA: 0.2 E.U./dL
pH, UA: 7 (ref 5.0–8.0)

## 2022-03-30 MED ORDER — TROSPIUM CHLORIDE ER 60 MG PO CP24: 1.0000 | ORAL_CAPSULE | Freq: Every day | ORAL | 5 refills | Status: DC

## 2022-04-18 ENCOUNTER — Ambulatory Visit: Payer: BC Managed Care – PPO | Admitting: Internal Medicine

## 2022-04-18 NOTE — Progress Notes (Deleted)
PCP:  Etta Grandchild, MD Primary Cardiologist: None Electrophysiologist: None   Alicia Hoover is a 46 y.o. female seen today for None for routine electrophysiology followup.  Since last being seen in our clinic the patient reports doing ***.  she denies chest pain, palpitations, dyspnea, PND, orthopnea, nausea, vomiting, dizziness, syncope, edema, weight gain, or early satiety.  Past Medical History:  Diagnosis Date   Asthma    GERD (gastroesophageal reflux disease)    Hypersomnia with sleep apnea    Hypertension    Migraine    Prediabetes    Rhinitis    Past Surgical History:  Procedure Laterality Date   CHOLECYSTECTOMY     TOOTH EXTRACTION      Current Outpatient Medications  Medication Sig Dispense Refill   acetaminophen (TYLENOL 8 HOUR) 650 MG CR tablet Take 1 tablet (650 mg total) by mouth every 8 (eight) hours as needed for pain. 15 tablet 0   ciclopirox (LOPROX) 0.77 % cream Apply topically 2 (two) times daily. 90 g 4   fluticasone (FLONASE) 50 MCG/ACT nasal spray Place 2 sprays into the nose daily.     levocetirizine (XYZAL) 5 MG tablet TAKE 1 TABLET BY MOUTH EVERY DAY IN THE EVENING 90 tablet 1   lisdexamfetamine (VYVANSE) 40 MG capsule Take 1 capsule (40 mg total) by mouth every morning. 30 capsule 0   methimazole (TAPAZOLE) 10 MG tablet Take 1.5 tablets (15 mg total) by mouth daily. 135 tablet 1   metroNIDAZOLE (FLAGYL) 500 MG tablet Take 1 tablet (500 mg total) by mouth 2 (two) times daily. 14 tablet 2   montelukast (SINGULAIR) 10 MG tablet Take 1 tablet (10 mg total) by mouth at bedtime. 90 tablet 1   Multiple Vitamin (MULTIVITAMIN) tablet Take 1 tablet by mouth daily.     norgestimate-ethinyl estradiol (ORTHO-CYCLEN) 0.25-35 MG-MCG tablet Take 1 tablet by mouth daily. 84 tablet 4   NURTEC 75 MG TBDP TAKE 1 TABLET BY MOUTH EVERY DAY AS NEEDED 8 tablet 3   OZEMPIC, 1 MG/DOSE, 4 MG/3ML SOPN INJECT 1MG  INTO THE SKIN ONCE A WEEK 9 mL 0   pantoprazole (PROTONIX) 40  MG tablet Take 1 tablet (40 mg total) by mouth 2 (two) times daily. 180 tablet 1   solifenacin (VESICARE) 10 MG tablet TAKE 1 TABLET BY MOUTH EVERY DAY 90 tablet 1   spironolactone (ALDACTONE) 25 MG tablet TAKE 1 TABLET (25 MG TOTAL) BY MOUTH DAILY. 90 tablet 0   Trospium Chloride 60 MG CP24 Take 1 capsule (60 mg total) by mouth daily. 30 capsule 5   No current facility-administered medications for this visit.    Allergies  Allergen Reactions   Codeine Hives and Nausea And Vomiting   Tramadol Diarrhea and Nausea And Vomiting    Social History   Socioeconomic History   Marital status: Widowed    Spouse name: Not on file   Number of children: 3   Years of education: Not on file   Highest education level: Not on file  Occupational History   Occupation: [employer]    Employer: [employer]  Tobacco Use   Smoking status: Former    Packs/day: 0.10    Years: 1.00    Total pack years: 0.10    Types: Cigarettes    Quit date: 10/09/1999    Years since quitting: 22.5   Smokeless tobacco: Never  Vaping Use   Vaping Use: Never used  Substance and Sexual Activity   Alcohol use:  Yes    Comment: 1 glass of white wine monthly   Drug use: No   Sexual activity: Yes    Birth control/protection: Pill  Other Topics Concern   Not on file  Social History Narrative   Not on file   Social Determinants of Health   Financial Resource Strain: Not on file  Food Insecurity: Not on file  Transportation Needs: Not on file  Physical Activity: Not on file  Stress: Not on file  Social Connections: Not on file  Intimate Partner Violence: Not on file     Review of Systems: All other systems reviewed and are otherwise negative except as noted above.  Physical Exam: There were no vitals filed for this visit.  GEN- The patient is well appearing, alert and oriented x 3 today.   HEENT: normocephalic, atraumatic; sclera clear, conjunctiva pink; hearing intact; oropharynx clear; neck  supple, no JVP Lymph- no cervical lymphadenopathy Lungs- Clear to ausculation bilaterally, normal work of breathing.  No wheezes, rales, rhonchi Heart- Regular rate and rhythm, no murmurs, rubs or gallops, PMI not laterally displaced GI- soft, non-tender, non-distended, bowel sounds present, no hepatosplenomegaly Extremities- no clubbing, cyanosis, or edema; DP/PT/radial pulses 2+ bilaterally MS- no significant deformity or atrophy Skin- warm and dry, no rash or lesion Psych- euthymic mood, full affect Neuro- strength and sensation are intact  EKG is ordered. Personal review of EKG from today shows ***  Additional studies reviewed include: Previous EP office notes. ***  Assessment and Plan:  1. Palpitations/PVCs/PACs Previous monitoring with PVC/PACs Continue lifestyle mods with exercise, weight loss, caffeine avoidance, ETOH avoidence, and CPAP compliance. Continue nadolol 10 mg daily. Can increase if needed   2. DOE *** ETT with no ST/T changes. Echo WNL Likely due to deconditioning. Lifestyle mods as noted above Worse after COVID. Encouraged exercise and weight loss.    3. Obesity There is no height or weight on file to calculate BMI.  Encouraged weight loss   4. OSA Encouraged weight loss Intolerant of CPAP.    5. HTN Stable on current regimen    6. Graves disease  On methimazole.  Follow up with {Blank single:19197::"Dr. Allred","Dr. Amada Jupiter. Klein","Dr. Camnitz","Dr. Lambert","EP APP"} in {Blank single:19197::"2 weeks","4 weeks","3 months","6 months","12 months","as usual post gen change"}   Graciella Freer, [place]  04/18/22 9:07 AM

## 2022-04-18 NOTE — Progress Notes (Deleted)
Name: Alicia Hoover  MRN/ DOB: [id number], Dec 11, 1975    Age/ Sex: 46 y.o., female     PCP: Etta Grandchild, MD   Reason for Endocrinology Evaluation: Hyperthyroidism     Initial Endocrinology Clinic Visit: 07/10/2019    PATIENT IDENTIFIER: Alicia Hoover is a 46 y.o., female with a past medical history of HTN, Obesity and Depression. She has followed with [facility] since 07/10/2019 for consultative assistance with management of her hyperthyroidism  HISTORICAL SUMMARY: The patient was first diagnosed with subclinical hyperthyroidism in 04/2019 with a TSh of 0.31 uIU/mL . She was started on Methimazole 04/2019 TRAb elevated at 14.60 IU/L  No Amiodarone  Was on Biotin until 05/2019   No radiation exposure.   Brother with thyroid disease     HYPOKALEMIA HISTORY: Pt has been noted with persistent and spontaneous hypokalemia since 08/2020 despite being on spironolactone. She was also noted with pre-diabetes with an A1c of 5.9 % in 02/2021 .  We proceeded with aldo: renin check up as well as screening for cushing syndrome 04/2021    SUBJECTIVE:    Today (04/18/2022):  Alicia Hoover is here for a follow up on hyperthyroidism secondary to Graves' disease.    Has been noted with weight loss that she attributes to Ozempic  Denies diarrhea or constipation  Denies local neck symptoms  Denies eye symptoms  Denies palpitations   Methimazole 10 mg, 1.5 tabs daily      HISTORY:  Past Medical History:  Past Medical History:  Diagnosis Date   Asthma    GERD (gastroesophageal reflux disease)    Hypersomnia with sleep apnea    Hypertension    Migraine    Prediabetes    Rhinitis    Past Surgical History:  Past Surgical History:  Procedure Laterality Date   CHOLECYSTECTOMY     TOOTH EXTRACTION     Social History:  reports that she quit smoking about 22 years ago. Her smoking use included cigarettes. She has a 0.10 pack-year smoking history. She has  never used smokeless tobacco. She reports current alcohol use. She reports that she does not use drugs. Family History:  Family History  Problem Relation Age of Onset   Diabetes Father    Early death Father    Diabetes Mother    COPD Mother    Rheum arthritis Mother    Hyperlipidemia Brother    Thyroid disease Brother    Alcohol abuse Neg Hx    Cancer Neg Hx    Depression Neg Hx    Drug abuse Neg Hx    Hearing loss Neg Hx    Heart disease Neg Hx    Hypertension Neg Hx    Stroke Neg Hx      HOME MEDICATIONS: Allergies as of 04/18/2022       Reactions   Codeine Hives, Nausea And Vomiting   Tramadol Diarrhea, Nausea And Vomiting        Medication List        Accurate as of April 18, 2022  6:48 AM. If you have any questions, ask your nurse or doctor.          acetaminophen 650 MG CR tablet Commonly known as: Tylenol 8 Hour Take 1 tablet (650 mg total) by mouth every 8 (eight) hours as needed for pain.   ciclopirox 0.77 % cream Commonly known as: Loprox Apply topically 2 (two) times daily.   fluticasone 50 MCG/ACT nasal spray Commonly known as:  FLONASE Place 2 sprays into the nose daily.   levocetirizine 5 MG tablet Commonly known as: XYZAL TAKE 1 TABLET BY MOUTH EVERY DAY IN THE EVENING   lisdexamfetamine 40 MG capsule Commonly known as: Vyvanse Take 1 capsule (40 mg total) by mouth every morning.   methimazole 10 MG tablet Commonly known as: TAPAZOLE Take 1.5 tablets (15 mg total) by mouth daily.   metroNIDAZOLE 500 MG tablet Commonly known as: FLAGYL Take 1 tablet (500 mg total) by mouth 2 (two) times daily.   montelukast 10 MG tablet Commonly known as: SINGULAIR Take 1 tablet (10 mg total) by mouth at bedtime.   multivitamin tablet Take 1 tablet by mouth daily.   norgestimate-ethinyl estradiol 0.25-35 MG-MCG tablet Commonly known as: ORTHO-CYCLEN Take 1 tablet by mouth daily.   Nurtec 75 MG Tbdp Generic drug: Rimegepant Sulfate TAKE 1  TABLET BY MOUTH EVERY DAY AS NEEDED   Ozempic (1 MG/DOSE) 4 MG/3ML Sopn Generic drug: Semaglutide (1 MG/DOSE) INJECT 1MG  INTO THE SKIN ONCE A WEEK   pantoprazole 40 MG tablet Commonly known as: PROTONIX Take 1 tablet (40 mg total) by mouth 2 (two) times daily.   solifenacin 10 MG tablet Commonly known as: VESICARE TAKE 1 TABLET BY MOUTH EVERY DAY   spironolactone 25 MG tablet Commonly known as: ALDACTONE TAKE 1 TABLET (25 MG TOTAL) BY MOUTH DAILY.   Trospium Chloride 60 MG Cp24 Take 1 capsule (60 mg total) by mouth daily.          OBJECTIVE:   PHYSICAL EXAM: VS: There were no vitals taken for this visit.   EXAM: General: Pt appears well and is in NAD  Eyes: External eye exam normal without stare, lid lag or exophthalmos.  EOM intact.    Neck: General: Supple without adenopathy. Thyroid: Thyroid size normal.  No goiter or nodules appreciated.  Lungs: Clear with good BS bilat with no rales, rhonchi, or wheezes  Heart: Auscultation: RRR.  Extremities: BL LE: No pretibial edema normal ROM and strength.  Mental Status: Judgment, insight: Intact Orientation: Oriented to time, place, and person Mood and affect: No depression, anxiety, or agitation     DATA REVIEWED:  10/05/2021 TSH 2.7 uIU/mL    ASSESSMENT / PLAN / RECOMMENDATIONS:   Hyperthyroidism Secondary to Graves' Disease:   - Clinically she is euthyroid  - No local neck symptoms -TSH normal at PCPs office, no changes -We discussed alternative options of treatment such as radioactive iodine ablation versus surgery, patient would like to continue with methimazole at this time   Medications   Continue methimazole 10 mg , 1.5 tabs daily     2. Graves' Disease:    - No extrathyroidal manifestations of Grave's disease.        F/U in 6 months       Signed electronically by: 10/07/2021, MD  [facility]  [facility] [address] [place] [place], [place] Phone: [phone] FAX: [phone]      CC: [phone], MD [address] [place] Phone: [phone]  Fax: [phone]   Return to Endocrinology clinic as below: Future Appointments  Date Time Provider Department Center  04/18/2022 11:10 AM Triston Lisanti, 06/19/2022, MD [id number] None  04/25/2022 10:40 AM 04/27/2022, PA-C [facility]  05/11/2022 12:40 PM 07/11/2022, MD [facility]

## 2022-04-25 ENCOUNTER — Ambulatory Visit: Payer: BC Managed Care – PPO | Admitting: Student

## 2022-04-25 ENCOUNTER — Other Ambulatory Visit: Payer: Self-pay | Admitting: Obstetrics

## 2022-04-25 ENCOUNTER — Other Ambulatory Visit: Payer: Self-pay | Admitting: Internal Medicine

## 2022-04-25 DIAGNOSIS — R002 Palpitations: Secondary | ICD-10-CM

## 2022-04-25 DIAGNOSIS — F5081 Binge eating disorder: Secondary | ICD-10-CM

## 2022-04-25 DIAGNOSIS — I10 Essential (primary) hypertension: Secondary | ICD-10-CM

## 2022-04-25 DIAGNOSIS — N946 Dysmenorrhea, unspecified: Secondary | ICD-10-CM

## 2022-04-25 MED ORDER — LISDEXAMFETAMINE DIMESYLATE 40 MG PO CAPS: 40.0000 mg | ORAL_CAPSULE | ORAL | 0 refills | Status: DC

## 2022-05-03 ENCOUNTER — Other Ambulatory Visit: Payer: Self-pay | Admitting: Internal Medicine

## 2022-05-03 DIAGNOSIS — L239 Allergic contact dermatitis, unspecified cause: Secondary | ICD-10-CM

## 2022-05-10 ENCOUNTER — Other Ambulatory Visit: Payer: Self-pay | Admitting: Internal Medicine

## 2022-05-10 DIAGNOSIS — I10 Essential (primary) hypertension: Secondary | ICD-10-CM

## 2022-05-10 DIAGNOSIS — E876 Hypokalemia: Secondary | ICD-10-CM

## 2022-05-11 ENCOUNTER — Encounter: Payer: Self-pay | Admitting: Obstetrics and Gynecology

## 2022-05-11 ENCOUNTER — Ambulatory Visit: Payer: BC Managed Care – PPO | Admitting: Obstetrics and Gynecology

## 2022-05-11 VITALS — BP 136/90 | HR 73

## 2022-05-11 DIAGNOSIS — N393 Stress incontinence (female) (male): Secondary | ICD-10-CM

## 2022-05-11 DIAGNOSIS — N3281 Overactive bladder: Secondary | ICD-10-CM | POA: Diagnosis not present

## 2022-05-11 MED ORDER — TROSPIUM CHLORIDE ER 60 MG PO CP24: 1.0000 | ORAL_CAPSULE | Freq: Every day | ORAL | 11 refills | Status: DC

## 2022-05-11 NOTE — Progress Notes (Signed)
[facility] Return Visit  SUBJECTIVE  History of Present Illness: Alicia Hoover is a 46 y.o. female seen in follow-up for OAB, SUI and constipation. Plan at last visit was to start trospium 60mg  ER daily. Referral was placed to pelvic PT. Also recommended fiber supplementation.   Now is able to get to the bathroom before she leaks, or leaks only a very small amount. Only wakes up 1-2 times per night. Denies dry mouth, dry eyes. Has not started to use her CPAP at night (worried about hearing her mom at night who is staying with her).  Starts the physical therapy in a few weeks.   Started taking fiber again and so the constipation has improved.   Past Medical History: Patient  has a past medical history of Asthma, GERD (gastroesophageal reflux disease), Hypersomnia with sleep apnea, Hypertension, Migraine, Prediabetes, and Rhinitis.   Past Surgical History: She  has a past surgical history that includes Cholecystectomy and Tooth extraction.   Medications: She has a current medication list which includes the following prescription(s): acetaminophen, ciclopirox, fluticasone, ibuprofen, levocetirizine, lisdexamfetamine, methimazole, metronidazole, montelukast, multivitamin, norgestimate-ethinyl estradiol, nurtec, ozempic (1 mg/dose), pantoprazole, solifenacin, spironolactone, trospium chloride, and [DISCONTINUED] dexlansoprazole.   Allergies: Patient is allergic to codeine and tramadol.   Social History: Patient  reports that she quit smoking about 22 years ago. Her smoking use included cigarettes. She has a 0.10 pack-year smoking history. She has never used smokeless tobacco. She reports current alcohol use. She reports that she does not use drugs.      OBJECTIVE     Physical Exam: Vitals:   05/11/22 1300  BP: (!) 136/90  Pulse: 73   Gen: No apparent distress, A&O x 3.  Detailed Urogynecologic Evaluation:  Deferred. Prior exam showed:  POP-Q:    POP-Q   -3                                             Aa   -3                                           Ba   -5.5                                              C    3                                            Gh   5                                            Pb   8                                            tvl    -2  Ap   -2                                            Bp   -7                                              D        ASSESSMENT AND PLAN    [name] is a 46 y.o. with:  1. Overactive bladder   2. SUI (stress urinary incontinence, female)    - Continue with Trospium 60mg  ER - Will be attending pelvic physical therapy  - Continue with fiber supplementation  Return 1 year or sooner if needed  , MD  Time spent: I spent 20 minutes dedicated to the care of this patient on the date of this encounter to include pre-visit review of records, face-to-face time with the patient and post visit documentation.

## 2022-05-27 ENCOUNTER — Other Ambulatory Visit: Payer: Self-pay | Admitting: Internal Medicine

## 2022-05-27 DIAGNOSIS — F5081 Binge eating disorder: Secondary | ICD-10-CM

## 2022-05-28 ENCOUNTER — Other Ambulatory Visit: Payer: Self-pay | Admitting: Obstetrics

## 2022-05-28 ENCOUNTER — Other Ambulatory Visit: Payer: Self-pay | Admitting: Internal Medicine

## 2022-05-28 DIAGNOSIS — N760 Acute vaginitis: Secondary | ICD-10-CM

## 2022-05-28 DIAGNOSIS — R7303 Prediabetes: Secondary | ICD-10-CM

## 2022-05-28 MED ORDER — OZEMPIC (1 MG/DOSE) 4 MG/3ML ~~LOC~~ SOPN: PEN_INJECTOR | SUBCUTANEOUS | 0 refills | Status: DC

## 2022-05-28 MED ORDER — LISDEXAMFETAMINE DIMESYLATE 40 MG PO CAPS: 40.0000 mg | ORAL_CAPSULE | ORAL | 0 refills | Status: DC

## 2022-06-06 NOTE — Therapy (Deleted)
OUTPATIENT PHYSICAL THERAPY FEMALE PELVIC EVALUATION   Patient Name: Alicia Hoover MRN: [id number] DOB:[birth year], 46 y.o., female Today's Date: 06/06/2022    Past Medical History:  Diagnosis Date   Asthma    GERD (gastroesophageal reflux disease)    Hypersomnia with sleep apnea    Hypertension    Migraine    Prediabetes    Rhinitis    Past Surgical History:  Procedure Laterality Date   CHOLECYSTECTOMY     TOOTH EXTRACTION     Patient Active Problem List   Diagnosis Date Noted   Body mass index 45.0-49.9, adult (HCC) 02/20/2022   Stress fracture of left hip 01/22/2022   Primary osteoarthritis of left hip 12/07/2021   Primary osteoarthritis of both knees 12/07/2021   Chronic left hip pain 12/07/2021   Hypokalemia 10/19/2021   Hyperthyroidism 10/19/2021   Encounter for general adult medical examination with abnormal findings 10/05/2021   Screen for colon cancer 10/05/2021   GERD (gastroesophageal reflux disease)    Binge-eating disorder, moderate 08/18/2020   Migraine without aura and without status migrainosus, not intractable 08/18/2020   Seasonal allergic rhinitis due to pollen 04/12/2020   Graves disease 10/12/2019   OAB (overactive bladder) 04/08/2019   Depression with anxiety 04/08/2019   Visit for screening mammogram 03/27/2017   Prediabetes 03/08/2016   Eczema, allergic 03/08/2016   Essential hypertension, benign 02/24/2014   Morbid obesity (HCC) 11/01/2010   HYPERSOMNIA WITH SLEEP APNEA UNSPECIFIED 10/16/2010    PCP: Etta Grandchild, MD  REFERRING PROVIDER: Marguerita Beards, MD  REFERRING DIAG: N32.81 (ICD-10-CM) - Overactive bladder  THERAPY DIAG:  No diagnosis found.  Rationale for Evaluation and Treatment Rehabilitation  ONSET DATE: ***  SUBJECTIVE:                                                                                                                                                                                            SUBJECTIVE STATEMENT: *** Fluid intake: {Yes/No:304960894}    PAIN:  Are you having pain? {yes/no:20286} NPRS scale: ***/10 Pain location: {pelvic pain location:27098}  Pain type: {type:313116} Pain description: {PAIN DESCRIPTION:21022940}   Aggravating factors: *** Relieving factors: ***  PRECAUTIONS: {Therapy precautions:24002}  WEIGHT BEARING RESTRICTIONS {Yes ***/No:24003}  FALLS:  Has patient fallen in last 6 months? {fallsyesno:27318}  LIVING ENVIRONMENT: Lives with: {[facility] lives with:25569::"lives with their family"} Lives in: {Lives in:25570} Stairs: {opstairs:27293} Has following equipment at home: {Assistive devices:23999}  OCCUPATION: ***  PLOF: {PLOF:24004}  PATIENT GOALS ***  PERTINENT HISTORY:  Cholecystectomy; hypertension Sexual abuse: {Yes/No:304960894}  BOWEL MOVEMENT Pain with bowel movement: {yes/no:20286} Type of bowel movement:{PT BM type:27100} Fully empty  rectum: {Yes/No:304960894} Leakage: {Yes/No:304960894} Pads: {Yes/No:304960894} Fiber supplement: {Yes/No:304960894}  URINATION Pain with urination: {yes/no:20286} Fully empty bladder: Yes: when urinating she feels dribbling after finishing and needs to urinate multiple times in a row Stream: {PT urination:27102} Urgency: {Yes/No:304960894} Frequency: day time 2; nocturia 1-2 times per night Leakage: Urge to void, Walking to the bathroom, Coughing, and Sneezing Pads: Yes: 2-3 mini liners per day  INTERCOURSE Pain with intercourse:  sexually active with no pain  PREGNANCY Vaginal deliveries *** Tearing {Yes***/No:304960894} C-section deliveries *** Currently pregnant {Yes***/No:304960894}  PROLAPSE {PT prolapse:27101}    OBJECTIVE:   DIAGNOSTIC FINDINGS:  Pelvic floor strength II/V; PVR of 20 ml was obtained by bladder scan.  PATIENT SURVEYS:  {rehab surveys:24030}  PFIQ-7 ***  COGNITION:  Overall cognitive status: {cognition:24006}     SENSATION:  Light  touch: {intact/deficits:24005}  Proprioception: {intact/deficits:24005}  MUSCLE LENGTH: Hamstrings: Right *** deg; Left *** deg Thomas test: Right *** deg; Left *** deg  LUMBAR SPECIAL TESTS:  {lumbar special test:25242}  FUNCTIONAL TESTS:  {Functional tests:24029}  GAIT: Distance walked: *** Assistive device utilized: {Assistive devices:23999} Level of assistance: {Levels of assistance:24026} Comments: ***               POSTURE: {posture:25561}   PELVIC ALIGNMENT:  LUMBARAROM/PROM  A/PROM A/PROM  eval  Flexion   Extension   Right lateral flexion   Left lateral flexion   Right rotation   Left rotation    (Blank rows = not tested)  LOWER EXTREMITY ROM:  {AROM/PROM:27142} ROM Right eval Left eval  Hip flexion    Hip extension    Hip abduction    Hip adduction    Hip internal rotation    Hip external rotation    Knee flexion    Knee extension    Ankle dorsiflexion    Ankle plantarflexion    Ankle inversion    Ankle eversion     (Blank rows = not tested)  LOWER EXTREMITY MMT:  MMT Right eval Left eval  Hip flexion    Hip extension    Hip abduction    Hip adduction    Hip internal rotation    Hip external rotation    Knee flexion    Knee extension    Ankle dorsiflexion    Ankle plantarflexion    Ankle inversion    Ankle eversion      PALPATION:   General  ***                External Perineal Exam ***                             Internal Pelvic Floor ***  Patient confirms identification and approves PT to assess internal pelvic floor and treatment {yes/no:20286}  PELVIC MMT:   MMT eval  Vaginal   Internal Anal Sphincter   External Anal Sphincter   Puborectalis   Diastasis Recti   (Blank rows = not tested)        TONE: ***  PROLAPSE: ***  TODAY'S TREATMENT  EVAL ***   PATIENT EDUCATION:  Education details: *** Person educated: {Person educated:25204} Education method: {Education Method:25205} Education comprehension:  {Education Comprehension:25206}   HOME EXERCISE PROGRAM: ***  ASSESSMENT:  CLINICAL IMPRESSION: Patient is a *** y.o. *** who was seen today for physical therapy evaluation and treatment for ***.    OBJECTIVE IMPAIRMENTS {opptimpairments:25111}.   ACTIVITY LIMITATIONS {activitylimitations:27494}  PARTICIPATION LIMITATIONS: {participationrestrictions:25113}  PERSONAL FACTORS {  Personal factors:25162} are also affecting patient's functional outcome.   REHAB POTENTIAL: {rehabpotential:25112}  CLINICAL DECISION MAKING: {clinical decision making:25114}  EVALUATION COMPLEXITY: {Evaluation complexity:25115}   GOALS: Goals reviewed with patient? {yes/no:20286}  SHORT TERM GOALS: Target date: {follow up:25551}  *** Baseline: Goal status: {GOALSTATUS:25110}  2.  *** Baseline:  Goal status: {GOALSTATUS:25110}  3.  *** Baseline:  Goal status: {GOALSTATUS:25110}  4.  *** Baseline:  Goal status: {GOALSTATUS:25110}  5.  *** Baseline:  Goal status: {GOALSTATUS:25110}  6.  *** Baseline:  Goal status: {GOALSTATUS:25110}  LONG TERM GOALS: Target date: {follow up:25551}   *** Baseline:  Goal status: {GOALSTATUS:25110}  2.  *** Baseline:  Goal status: {GOALSTATUS:25110}  3.  *** Baseline:  Goal status: {GOALSTATUS:25110}  4.  *** Baseline:  Goal status: {GOALSTATUS:25110}  5.  *** Baseline:  Goal status: {GOALSTATUS:25110}  6.  *** Baseline:  Goal status: {GOALSTATUS:25110}  PLAN: PT FREQUENCY: {rehab frequency:25116}  PT DURATION: {rehab duration:25117}  PLANNED INTERVENTIONS: {rehab planned interventions:25118::"Therapeutic exercises","Therapeutic activity","Neuromuscular re-education","Balance training","Gait training","Patient/Family education","Self Care","Joint mobilization"}  PLAN FOR NEXT SESSION: ***   Baley Shands, PT 06/06/2022, 8:10 AM

## 2022-06-07 ENCOUNTER — Encounter: Payer: BC Managed Care – PPO | Attending: Obstetrics and Gynecology | Admitting: Physical Therapy

## 2022-06-11 ENCOUNTER — Other Ambulatory Visit: Payer: Self-pay | Admitting: Internal Medicine

## 2022-06-11 DIAGNOSIS — K2100 Gastro-esophageal reflux disease with esophagitis, without bleeding: Secondary | ICD-10-CM

## 2022-06-11 DIAGNOSIS — J301 Allergic rhinitis due to pollen: Secondary | ICD-10-CM

## 2022-06-14 ENCOUNTER — Encounter: Payer: BC Managed Care – PPO | Admitting: Physical Therapy

## 2022-06-18 ENCOUNTER — Encounter: Payer: Self-pay | Admitting: Internal Medicine

## 2022-06-19 ENCOUNTER — Ambulatory Visit: Payer: BC Managed Care – PPO | Admitting: Internal Medicine

## 2022-06-20 ENCOUNTER — Other Ambulatory Visit: Payer: Self-pay | Admitting: Internal Medicine

## 2022-06-21 ENCOUNTER — Encounter: Payer: BC Managed Care – PPO | Admitting: Physical Therapy

## 2022-06-28 ENCOUNTER — Encounter: Payer: BC Managed Care – PPO | Admitting: Physical Therapy

## 2022-07-05 ENCOUNTER — Other Ambulatory Visit: Payer: Self-pay | Admitting: Internal Medicine

## 2022-07-12 ENCOUNTER — Other Ambulatory Visit: Payer: Self-pay | Admitting: Internal Medicine

## 2022-07-12 DIAGNOSIS — F5081 Binge eating disorder: Secondary | ICD-10-CM

## 2022-07-20 ENCOUNTER — Encounter: Payer: Self-pay | Admitting: Internal Medicine

## 2022-07-20 ENCOUNTER — Other Ambulatory Visit: Payer: Self-pay | Admitting: Internal Medicine

## 2022-07-20 DIAGNOSIS — E876 Hypokalemia: Secondary | ICD-10-CM

## 2022-07-20 DIAGNOSIS — I10 Essential (primary) hypertension: Secondary | ICD-10-CM

## 2022-07-20 DIAGNOSIS — R7303 Prediabetes: Secondary | ICD-10-CM

## 2022-07-20 DIAGNOSIS — F5081 Binge eating disorder: Secondary | ICD-10-CM

## 2022-07-25 ENCOUNTER — Ambulatory Visit: Payer: BC Managed Care – PPO | Admitting: Internal Medicine

## 2022-07-25 ENCOUNTER — Encounter: Payer: Self-pay | Admitting: Internal Medicine

## 2022-07-25 VITALS — BP 138/88 | HR 80 | Temp 98.5°F | Resp 16 | Ht 65.0 in | Wt 289.1 lb

## 2022-07-25 DIAGNOSIS — F5081 Binge eating disorder: Secondary | ICD-10-CM

## 2022-07-25 DIAGNOSIS — K2100 Gastro-esophageal reflux disease with esophagitis, without bleeding: Secondary | ICD-10-CM

## 2022-07-25 DIAGNOSIS — E0590 Thyrotoxicosis, unspecified without thyrotoxic crisis or storm: Secondary | ICD-10-CM | POA: Diagnosis not present

## 2022-07-25 DIAGNOSIS — R7303 Prediabetes: Secondary | ICD-10-CM

## 2022-07-25 DIAGNOSIS — I10 Essential (primary) hypertension: Secondary | ICD-10-CM

## 2022-07-25 DIAGNOSIS — L233 Allergic contact dermatitis due to drugs in contact with skin: Secondary | ICD-10-CM

## 2022-07-25 LAB — BASIC METABOLIC PANEL
BUN: 14 mg/dL (ref 6–23)
CO2: 27 mEq/L (ref 19–32)
Calcium: 8.9 mg/dL (ref 8.4–10.5)
Chloride: 102 mEq/L (ref 96–112)
Creatinine, Ser: 0.88 mg/dL (ref 0.40–1.20)
GFR: 78.25 mL/min (ref >60.00)
Glucose, Bld: 76 mg/dL (ref 70–99)
Potassium: 4.1 mEq/L (ref 3.5–5.1)
Sodium: 136 mEq/L (ref 135–145)

## 2022-07-25 LAB — CBC WITH DIFFERENTIAL/PLATELET
Basophils Absolute: 0 10*3/uL (ref 0.0–0.1)
Basophils Relative: 0.5 % (ref 0.0–3.0)
Eosinophils Absolute: 0.1 10*3/uL (ref 0.0–0.7)
Eosinophils Relative: 1.2 % (ref 0.0–5.0)
HCT: 40.6 % (ref 36.0–46.0)
Hemoglobin: 13.3 g/dL (ref 12.0–15.0)
Lymphocytes Relative: 22.8 % (ref 12.0–46.0)
Lymphs Abs: 2.2 10*3/uL (ref 0.7–4.0)
MCHC: 32.7 g/dL (ref 30.0–36.0)
MCV: 81.3 fl (ref 78.0–100.0)
Monocytes Absolute: 0.8 10*3/uL (ref 0.1–1.0)
Monocytes Relative: 8.2 % (ref 3.0–12.0)
Neutro Abs: 6.6 10*3/uL (ref 1.4–7.7)
Neutrophils Relative %: 67.3 % (ref 43.0–77.0)
Platelets: 364 10*3/uL (ref 150.0–400.0)
RBC: 4.99 Mil/uL (ref 3.87–5.11)
RDW: 13.4 % (ref 11.5–15.5)
WBC: 9.8 10*3/uL (ref 4.0–10.5)

## 2022-07-25 LAB — TSH: TSH: 4.81 u[IU]/mL (ref 0.35–5.50)

## 2022-07-25 LAB — HEMOGLOBIN A1C: Hgb A1c MFr Bld: 5.9 % (ref 4.6–6.5)

## 2022-07-25 MED ORDER — FLUOCINONIDE EMULSIFIED BASE 0.05 % EX CREA: 1.0000 | TOPICAL_CREAM | Freq: Two times a day (BID) | CUTANEOUS | 1 refills | Status: DC

## 2022-07-25 MED ORDER — LISDEXAMFETAMINE DIMESYLATE 50 MG PO CAPS: 50.0000 mg | ORAL_CAPSULE | Freq: Every day | ORAL | 0 refills | Status: DC

## 2022-07-25 NOTE — Patient Instructions (Signed)
Hypertension, Adult High blood pressure (hypertension) is when the force of blood pumping through the arteries is too strong. The arteries are the blood vessels that carry blood from the heart throughout the body. Hypertension forces the heart to work harder to pump blood and may cause arteries to become narrow or stiff. Untreated or uncontrolled hypertension can lead to a heart attack, heart failure, a stroke, kidney disease, and other problems. A blood pressure reading consists of a higher number over a lower number. Ideally, your blood pressure should be below 120/80. The first ("top") number is called the systolic pressure. It is a measure of the pressure in your arteries as your heart beats. The second ("bottom") number is called the diastolic pressure. It is a measure of the pressure in your arteries as the heart relaxes. What are the causes? The exact cause of this condition is not known. There are some conditions that result in high blood pressure. What increases the risk? Certain factors may make you more likely to develop high blood pressure. Some of these risk factors are under your control, including: Smoking. Not getting enough exercise or physical activity. Being overweight. Having too much fat, sugar, calories, or salt (sodium) in your diet. Drinking too much alcohol. Other risk factors include: Having a personal history of heart disease, diabetes, high cholesterol, or kidney disease. Stress. Having a family history of high blood pressure and high cholesterol. Having obstructive sleep apnea. Age. The risk increases with age. What are the signs or symptoms? High blood pressure may not cause symptoms. Very high blood pressure (hypertensive crisis) may cause: Headache. Fast or irregular heartbeats (palpitations). Shortness of breath. Nosebleed. Nausea and vomiting. Vision changes. Severe chest pain, dizziness, and seizures. How is this diagnosed? This condition is diagnosed by  measuring your blood pressure while you are seated, with your arm resting on a flat surface, your legs uncrossed, and your feet flat on the floor. The cuff of the blood pressure monitor will be placed directly against the skin of your upper arm at the level of your heart. Blood pressure should be measured at least twice using the same arm. Certain conditions can cause a difference in blood pressure between your right and left arms. If you have a high blood pressure reading during one visit or you have normal blood pressure with other risk factors, you may be asked to: Return on a different day to have your blood pressure checked again. Monitor your blood pressure at home for 1 week or longer. If you are diagnosed with hypertension, you may have other blood or imaging tests to help your health care provider understand your overall risk for other conditions. How is this treated? This condition is treated by making healthy lifestyle changes, such as eating healthy foods, exercising more, and reducing your alcohol intake. You may be referred for counseling on a healthy diet and physical activity. Your health care provider may prescribe medicine if lifestyle changes are not enough to get your blood pressure under control and if: Your systolic blood pressure is above 130. Your diastolic blood pressure is above 80. Your personal target blood pressure may vary depending on your medical conditions, your age, and other factors. Follow these instructions at home: Eating and drinking  Eat a diet that is high in fiber and potassium, and low in sodium, added sugar, and fat. An example of this eating plan is called the DASH diet. DASH stands for Dietary Approaches to Stop Hypertension. To eat this way: Eat   plenty of fresh fruits and vegetables. Try to fill one half of your plate at each meal with fruits and vegetables. Eat whole grains, such as whole-wheat pasta, brown rice, or whole-grain bread. Fill about one  fourth of your plate with whole grains. Eat or drink low-fat dairy products, such as skim milk or low-fat yogurt. Avoid fatty cuts of meat, processed or cured meats, and poultry with skin. Fill about one fourth of your plate with lean proteins, such as fish, chicken without skin, beans, eggs, or tofu. Avoid pre-made and processed foods. These tend to be higher in sodium, added sugar, and fat. Reduce your daily sodium intake. Many people with hypertension should eat less than 1,500 mg of sodium a day. Do not drink alcohol if: Your health care provider tells you not to drink. You are pregnant, may be pregnant, or are planning to become pregnant. If you drink alcohol: Limit how much you have to: 0-1 drink a day for women. 0-2 drinks a day for men. Know how much alcohol is in your drink. In the [place] one drink equals one 12 oz bottle of beer (355 mL), one 5 oz glass of wine (148 mL), or one 1 oz glass of hard liquor (44 mL). Lifestyle  Work with your health care provider to maintain a healthy body weight or to lose weight. Ask what an ideal weight is for you. Get at least 30 minutes of exercise that causes your heart to beat faster (aerobic exercise) most days of the week. Activities may include walking, swimming, or biking. Include exercise to strengthen your muscles (resistance exercise), such as Pilates or lifting weights, as part of your weekly exercise routine. Try to do these types of exercises for 30 minutes at least 3 days a week. Do not use any products that contain nicotine or tobacco. These products include cigarettes, chewing tobacco, and vaping devices, such as e-cigarettes. If you need help quitting, ask your health care provider. Monitor your blood pressure at home as told by your health care provider. Keep all follow-up visits. This is important. Medicines Take over-the-counter and prescription medicines only as told by your health care provider. Follow directions carefully. Blood  pressure medicines must be taken as prescribed. Do not skip doses of blood pressure medicine. Doing this puts you at risk for problems and can make the medicine less effective. Ask your health care provider about side effects or reactions to medicines that you should watch for. Contact a health care provider if you: Think you are having a reaction to a medicine you are taking. Have headaches that keep coming back (recurring). Feel dizzy. Have swelling in your ankles. Have trouble with your vision. Get help right away if you: Develop a severe headache or confusion. Have unusual weakness or numbness. Feel faint. Have severe pain in your chest or abdomen. Vomit repeatedly. Have trouble breathing. These symptoms may be an emergency. Get help right away. Call 911. Do not wait to see if the symptoms will go away. Do not drive yourself to the hospital. Summary Hypertension is when the force of blood pumping through your arteries is too strong. If this condition is not controlled, it may put you at risk for serious complications. Your personal target blood pressure may vary depending on your medical conditions, your age, and other factors. For most people, a normal blood pressure is less than 120/80. Hypertension is treated with lifestyle changes, medicines, or a combination of both. Lifestyle changes include losing weight, eating a healthy,   low-sodium diet, exercising more, and limiting alcohol. This information is not intended to replace advice given to you by your health care provider. Make sure you discuss any questions you have with your health care provider. Document Revised: 08/01/2021 Document Reviewed: 08/01/2021 Elsevier Patient Education  2023 [employer] Inc[employer]  

## 2022-07-25 NOTE — Progress Notes (Unsigned)
Subjective:  Patient ID: Alicia Hoover, female    DOB: [birth year]  Age: 46 y.o. MRN: [id number]  CC: Rash and Hypertension   HPI TANNY HARNACK presents for f/up -  She complains of a 72-month history of rash on the dorsal surfaces of both wrists.  The rash started when she was wearing a new [employer] watch.  She  has changed to a plastic watch but has been treating the itchy rash with Neosporin.  She feels like she has reached a plateau with her current dose of Vyvanse and would like to increase the dose.  Outpatient Medications Prior to Visit  Medication Sig Dispense Refill   ciclopirox (LOPROX) 0.77 % cream Apply topically 2 (two) times daily. 90 g 4   fluticasone (FLONASE) 50 MCG/ACT nasal spray Place 2 sprays into the nose daily.     ibuprofen (ADVIL) 800 MG tablet TAKE 1 TABLET BY MOUTH EVERY 8 HOURS AS NEEDED 30 tablet 5   levocetirizine (XYZAL) 5 MG tablet TAKE 1 TABLET BY MOUTH EVERY DAY IN THE EVENING 90 tablet 1   methimazole (TAPAZOLE) 10 MG tablet TAKE 1 AND 1/2 TABLETS DAILY BY MOUTH 45 tablet 0   metroNIDAZOLE (FLAGYL) 500 MG tablet TAKE 1 TABLET BY MOUTH TWICE A DAY 14 tablet 0   montelukast (SINGULAIR) 10 MG tablet TAKE 1 TABLET BY MOUTH EVERYDAY AT BEDTIME 90 tablet 1   Multiple Vitamin (MULTIVITAMIN) tablet Take 1 tablet by mouth daily.     norgestimate-ethinyl estradiol (ORTHO-CYCLEN) 0.25-35 MG-MCG tablet Take 1 tablet by mouth daily. 84 tablet 4   NURTEC 75 MG TBDP TAKE 1 TABLET BY MOUTH EVERY DAY AS NEEDED 8 tablet 3   pantoprazole (PROTONIX) 40 MG tablet TAKE 1 TABLET BY MOUTH TWICE A DAY 180 tablet 1   Semaglutide, 1 MG/DOSE, (OZEMPIC, 1 MG/DOSE,) 4 MG/3ML SOPN INJECT 1MG  INTO THE SKIN ONCE A WEEK 9 mL 0   solifenacin (VESICARE) 10 MG tablet TAKE 1 TABLET BY MOUTH EVERY DAY 90 tablet 1   spironolactone (ALDACTONE) 25 MG tablet TAKE 1 TABLET (25 MG TOTAL) BY MOUTH DAILY. 90 tablet 0   Trospium Chloride 60 MG CP24 Take 1 capsule (60 mg total) by mouth  daily. 30 capsule 11   lisdexamfetamine (VYVANSE) 40 MG capsule Take 1 capsule (40 mg total) by mouth every morning. 30 capsule 0   acetaminophen (TYLENOL 8 HOUR) 650 MG CR tablet Take 1 tablet (650 mg total) by mouth every 8 (eight) hours as needed for pain. 15 tablet 0   No facility-administered medications prior to visit.    ROS Review of Systems  Constitutional: Negative.  Negative for diaphoresis and fatigue.  HENT: Negative.    Eyes: Negative.   Respiratory:  Negative for cough, chest tightness, shortness of breath and wheezing.   Cardiovascular:  Negative for chest pain, palpitations and leg swelling.  Gastrointestinal:  Negative for constipation, diarrhea, nausea and vomiting.  Endocrine: Negative.   Genitourinary: Negative.  Negative for difficulty urinating.  Musculoskeletal: Negative.   Skin:  Positive for color change and rash.  Neurological:  Negative for dizziness, weakness, light-headedness and headaches.  Hematological: Negative.  Negative for adenopathy. Does not bruise/bleed easily.    Objective:  BP 138/88   Pulse 80   Temp 98.5 F (36.9 C) (Oral)   Resp 16   Ht 5\' 5"  (1.651 m)   Wt 289 lb 2 oz (131.1 kg)   LMP 06/29/2022 (Approximate)   SpO2 94%  BMI 48.11 kg/m   BP Readings from Last 3 Encounters:  07/25/22 138/88  05/11/22 (!) 136/90  03/30/22 137/80    Wt Readings from Last 3 Encounters:  07/25/22 289 lb 2 oz (131.1 kg)  03/30/22 288 lb (130.6 kg)  02/20/22 288 lb 3.2 oz (130.7 kg)    Physical Exam Vitals reviewed.  Constitutional:      Appearance: She is obese.  HENT:     Nose: Nose normal.     Mouth/Throat:     Mouth: Mucous membranes are moist.  Eyes:     Conjunctiva/sclera: Conjunctivae normal.  Cardiovascular:     Rate and Rhythm: Normal rate and regular rhythm.     Heart sounds: No murmur heard. Pulmonary:     Effort: Pulmonary effort is normal.     Breath sounds: No stridor. No wheezing, rhonchi or rales.  Abdominal:      General: Abdomen is flat.     Palpations: There is no mass.     Tenderness: There is no abdominal tenderness. There is no guarding.     Hernia: No hernia is present.  Musculoskeletal:        General: Normal range of motion.     Cervical back: Neck supple.     Right lower leg: No edema.     Left lower leg: No edema.  Skin:    General: Skin is warm.     Findings: Rash present. Rash is papular and scaling. Rash is not macular, pustular, urticarial or vesicular.     Comments: On the dorsal surfaces of both wrists there is fissuring, papules, hyperpigmentation, and scaling.  Neurological:     General: No focal deficit present.     Mental Status: She is alert.  Psychiatric:        Mood and Affect: Mood normal.     Lab Results  Component Value Date   WBC 9.8 07/25/2022   HGB 13.3 07/25/2022   HCT 40.6 07/25/2022   PLT 364.0 07/25/2022   GLUCOSE 76 07/25/2022   CHOL 138 10/05/2021   TRIG 140.0 10/05/2021   HDL 35.80 (L) 10/05/2021   LDLDIRECT 57.0 03/27/2017   LDLCALC 74 10/05/2021   ALT 15 10/05/2021   AST 16 10/05/2021   NA 136 07/25/2022   K 4.1 07/25/2022   CL 102 07/25/2022   CREATININE 0.88 07/25/2022   BUN 14 07/25/2022   CO2 27 07/25/2022   TSH 4.81 07/25/2022   HGBA1C 5.9 07/25/2022    MR HIP LEFT WO CONTRAST  Result Date: 01/22/2022 CLINICAL DATA:  Left hip pain. EXAM: MR OF THE LEFT HIP WITHOUT CONTRAST TECHNIQUE: Multiplanar, multisequence MR imaging was performed. No intravenous contrast was administered. COMPARISON:  Radiographs 12/07/2021 FINDINGS: Both hips are normally located. Moderate degenerative changes bilaterally with degenerative chondrosis, mild joint space narrowing and early spurring. No stress fracture or AVN involving the hips. However, there is abnormal edema like signal abnormality in the left acetabulum suspicious for stress reaction or subtle stress fracture. No hip joint effusion or periarticular fluid collections to suggest a paralabral cyst.  No definite labral tears. The hip and pelvic musculature is intact. Mild peritendinitis but no trochanteric bursitis. No muscle tear, myositis or mass. The hamstring tendons are intact. No significant intrapelvic abnormalities are identified. IMPRESSION: 1. Moderate bilateral hip joint degenerative changes. 2. Edema like signal abnormality in the left acetabulum suspicious for stress reaction or subtle stress fracture. 3. Intact bony pelvis. 4. Mild peritendinitis but no trochanteric bursitis. Electronically Signed  By: Rudie Meyer M.D.   On: 01/22/2022 12:22    Assessment & Plan:   Shacola was seen today for rash and hypertension.  Diagnoses and all orders for this visit:  Essential hypertension, benign- Her blood pressure is well controlled. -     Basic metabolic panel; Future -     TSH; Future -     CBC with Differential/Platelet; Future -     CBC with Differential/Platelet -     TSH -     Basic metabolic panel  Gastroesophageal reflux disease with esophagitis without hemorrhage -     CBC with Differential/Platelet; Future -     CBC with Differential/Platelet  Hyperthyroidism- She is euthyroid. -     TSH; Future -     TSH  Binge-eating disorder, moderate -     lisdexamfetamine (VYVANSE) 50 MG capsule; Take 1 capsule (50 mg total) by mouth daily.  Prediabetes -     Basic metabolic panel; Future -     Hemoglobin A1c; Future -     Hemoglobin A1c -     Basic metabolic panel  Allergic contact dermatitis due to drugs in contact with skin- I have asked her to stop using Neosporin ointment. -     fluocinonide-emollient (LIDEX-E) 0.05 % cream; Apply 1 Application topically 2 (two) times daily.   I have discontinued Eniola L. Pyles's lisdexamfetamine. I am also having her start on fluocinonide-emollient and lisdexamfetamine. Additionally, I am having her maintain her fluticasone, multivitamin, acetaminophen, solifenacin, norgestimate-ethinyl estradiol, ciclopirox, Nurtec,  ibuprofen, levocetirizine, Trospium Chloride, metroNIDAZOLE, Ozempic (1 MG/DOSE), pantoprazole, montelukast, methimazole, and spironolactone.  Meds ordered this encounter  Medications   fluocinonide-emollient (LIDEX-E) 0.05 % cream    Sig: Apply 1 Application topically 2 (two) times daily.    Dispense:  60 g    Refill:  1   lisdexamfetamine (VYVANSE) 50 MG capsule    Sig: Take 1 capsule (50 mg total) by mouth daily.    Dispense:  30 capsule    Refill:  0     Follow-up: Return in about 3 months (around 10/25/2022).  Sanda Linger, MD

## 2022-07-26 ENCOUNTER — Other Ambulatory Visit: Payer: Self-pay | Admitting: Internal Medicine

## 2022-08-01 ENCOUNTER — Encounter: Payer: Self-pay | Admitting: Internal Medicine

## 2022-08-01 NOTE — Telephone Encounter (Signed)
Key: [id number]

## 2022-08-02 NOTE — Telephone Encounter (Signed)
Approved 08/01/2022 - 08/01/2025

## 2022-08-15 ENCOUNTER — Encounter: Payer: Self-pay | Admitting: Internal Medicine

## 2022-08-16 ENCOUNTER — Other Ambulatory Visit: Payer: Self-pay | Admitting: Internal Medicine

## 2022-09-01 ENCOUNTER — Other Ambulatory Visit: Payer: Self-pay | Admitting: Internal Medicine

## 2022-09-01 DIAGNOSIS — F5081 Binge eating disorder: Secondary | ICD-10-CM

## 2022-09-03 ENCOUNTER — Other Ambulatory Visit: Payer: Self-pay | Admitting: Internal Medicine

## 2022-09-03 DIAGNOSIS — K2100 Gastro-esophageal reflux disease with esophagitis, without bleeding: Secondary | ICD-10-CM

## 2022-09-03 MED ORDER — LISDEXAMFETAMINE DIMESYLATE 50 MG PO CAPS: 50.0000 mg | ORAL_CAPSULE | Freq: Every day | ORAL | 0 refills | Status: DC

## 2022-09-04 ENCOUNTER — Other Ambulatory Visit: Payer: Self-pay | Admitting: Internal Medicine

## 2022-09-04 NOTE — Telephone Encounter (Signed)
Patient called checking on the status of her Ozempic - please advise - Patient said she would be willing to take something else that is available.

## 2022-09-05 ENCOUNTER — Encounter: Payer: Self-pay | Admitting: Internal Medicine

## 2022-09-05 ENCOUNTER — Ambulatory Visit: Payer: BC Managed Care – PPO | Admitting: Internal Medicine

## 2022-09-05 VITALS — BP 126/78 | HR 80 | Ht 65.0 in | Wt 296.0 lb

## 2022-09-05 DIAGNOSIS — E0500 Thyrotoxicosis with diffuse goiter without thyrotoxic crisis or storm: Secondary | ICD-10-CM | POA: Diagnosis not present

## 2022-09-05 DIAGNOSIS — E0590 Thyrotoxicosis, unspecified without thyrotoxic crisis or storm: Secondary | ICD-10-CM

## 2022-09-05 MED ORDER — METHIMAZOLE 5 MG PO TABS: 5.0000 mg | ORAL_TABLET | Freq: Every day | ORAL | 3 refills | Status: DC

## 2022-09-05 NOTE — Progress Notes (Signed)
Name: Alicia Hoover  MRN/ DOB: [id number], [birth year]    Age/ Sex: 46 y.o., female     PCP: Alicia Grandchild, MD   Reason for Endocrinology Evaluation: Hyperthyroidism     Initial Endocrinology Clinic Visit: 07/10/2019    PATIENT IDENTIFIER: [name] is a 46 y.o., female with a past medical history of HTN, Obesity and Depression. She has followed with [facility] since 07/10/2019 for consultative assistance with management of her hyperthyroidism  HISTORICAL SUMMARY: The patient was first diagnosed with subclinical hyperthyroidism in 04/2019 with a TSh of 0.31 uIU/mL . She was started on Methimazole 04/2019 TRAb elevated at 14.60 IU/L  No Amiodarone  Was on Biotin until 05/2019   No radiation exposure.   Brother with thyroid disease     HYPOKALEMIA HISTORY: Pt has been noted with persistent and spontaneous hypokalemia since 08/2020 despite being on spironolactone.  Aldo was 10 mg/DL, renin was normal at 4.270 and normal Aldo/renin ratio-of note these were done while the patient on spironolactone Patient did not submit 24-hour urinary cortisol   SUBJECTIVE:    Today (09/05/2022):  Alicia Hoover is here for a follow up on hyperthyroidism secondary to Graves' disease.    Pt has been noted with weight gain  Denies diarrhea or constipation  Denies local neck symptoms  Denies eye symptoms  Denies palpitations  Denies tremors    Methimazole 10 mg daily   HISTORY:  Past Medical History:  Past Medical History:  Diagnosis Date   Asthma    GERD (gastroesophageal reflux disease)    Hypersomnia with sleep apnea    Hypertension    Migraine    Prediabetes    Rhinitis    Past Surgical History:  Past Surgical History:  Procedure Laterality Date   CHOLECYSTECTOMY     TOOTH EXTRACTION     Social History:  reports that she quit smoking about 22 years ago. Her smoking use included cigarettes. She has a 0.10 pack-year smoking history. She has never  used smokeless tobacco. She reports current alcohol use. She reports that she does not use drugs. Family History:  Family History  Problem Relation Age of Onset   Diabetes Mother    COPD Mother    Rheum arthritis Mother    Diabetes Father    Early death Father    Hyperlipidemia Brother    Thyroid disease Brother    Alcohol abuse Neg Hx    Cancer Neg Hx    Depression Neg Hx    Drug abuse Neg Hx    Hearing loss Neg Hx    Heart disease Neg Hx    Hypertension Neg Hx    Stroke Neg Hx      HOME MEDICATIONS: Allergies as of 09/05/2022       Reactions   Codeine Hives, Nausea And Vomiting   Tramadol Diarrhea, Nausea And Vomiting        Medication List        Accurate as of September 05, 2022  8:27 AM. If you have any questions, ask your nurse or doctor.          STOP taking these medications    metroNIDAZOLE 500 MG tablet Commonly known as: FLAGYL Stopped by: Alicia Shorts, MD   solifenacin 10 MG tablet Commonly known as: VESICARE Stopped by: Alicia Shorts, MD       TAKE these medications    acetaminophen 650 MG CR tablet Commonly known as: Tylenol 8 Hour  Take 1 tablet (650 mg total) by mouth every 8 (eight) hours as needed for pain.   ciclopirox 0.77 % cream Commonly known as: Loprox Apply topically 2 (two) times daily.   fluocinonide-emollient 0.05 % cream Commonly known as: LIDEX-E Apply 1 Application topically 2 (two) times daily.   fluticasone 50 MCG/ACT nasal spray Commonly known as: FLONASE Place 2 sprays into the nose daily.   ibuprofen 800 MG tablet Commonly known as: ADVIL TAKE 1 TABLET BY MOUTH EVERY 8 HOURS AS NEEDED   levocetirizine 5 MG tablet Commonly known as: XYZAL TAKE 1 TABLET BY MOUTH EVERY DAY IN THE EVENING   lisdexamfetamine 50 MG capsule Commonly known as: Vyvanse Take 1 capsule (50 mg total) by mouth daily.   methimazole 10 MG tablet Commonly known as: TAPAZOLE TAKE 1 AND 1/2 TABLETS DAILY BY  MOUTH What changed: See the new instructions.   montelukast 10 MG tablet Commonly known as: SINGULAIR TAKE 1 TABLET BY MOUTH EVERYDAY AT BEDTIME   multivitamin tablet Take 1 tablet by mouth daily.   norgestimate-ethinyl estradiol 0.25-35 MG-MCG tablet Commonly known as: ORTHO-CYCLEN Take 1 tablet by mouth daily.   Nurtec 75 MG Tbdp Generic drug: Rimegepant Sulfate TAKE 1 TABLET BY MOUTH EVERY DAY AS NEEDED   Ozempic (1 MG/DOSE) 4 MG/3ML Sopn Generic drug: Semaglutide (1 MG/DOSE) INJECT 1MG  INTO THE SKIN ONCE A WEEK   pantoprazole 40 MG tablet Commonly known as: PROTONIX TAKE 1 TABLET BY MOUTH TWICE A DAY   spironolactone 25 MG tablet Commonly known as: ALDACTONE TAKE 1 TABLET (25 MG TOTAL) BY MOUTH DAILY.   Trospium Chloride 60 MG Cp24 Take 1 capsule (60 mg total) by mouth daily.          OBJECTIVE:   PHYSICAL EXAM: VS: BP 126/78 (BP Location: Left Arm, Patient Position: Sitting, Cuff Size: Large)   Pulse 80   Ht 5\' 5"  (1.651 m)   Wt 296 lb (134.3 kg)   BMI 49.26 kg/m    EXAM: General: Pt appears well and is in NAD  Eyes: External eye exam normal without stare, lid lag or exophthalmos.  EOM intact.    Neck: General: Supple without adenopathy. Thyroid: Thyroid size normal.  No goiter or nodules appreciated.  Lungs: Clear with good BS bilat with no rales, rhonchi, or wheezes  Heart: Auscultation: RRR.  Extremities: BL LE: No pretibial edema normal ROM and strength.  Mental Status: Judgment, insight: Intact Orientation: Oriented to time, place, and person Mood and affect: No depression, anxiety, or agitation     DATA REVIEWED:   Latest Reference Range & Units 07/25/22 09:52  TSH 0.35 - 5.50 uIU/mL 4.81    Latest Reference Range & Units 07/25/22 09:52  Sodium 135 - 145 mEq/L 136  Potassium 3.5 - 5.1 mEq/L 4.1  Chloride 96 - 112 mEq/L 102  CO2 19 - 32 mEq/L 27  Glucose 70 - 99 mg/dL 76  BUN 6 - 23 mg/dL 14  Creatinine 07/27/22 - 07/27/22 mg/dL 8.52   Calcium 8.4 - 7.78 mg/dL 8.9  GFR 2.42 mL/min 78.25     ASSESSMENT / PLAN / RECOMMENDATIONS:   Hyperthyroidism Secondary to Graves' Disease:   - Clinically she is euthyroid  - No local neck symptoms -TSH at PCPs office was elevated, will reduce methimazole as below  -She had opted to continue on Methimazole vs  alternative options of treatment such as radioactive iodine ablation versus surgery   Medications   Decrease  methimazole 5 mg  ,  1 tablet daily     2. Graves' Disease:    - No extrathyroidal manifestations of Grave's disease.      F/U in 6 months   Labs in 2 months     Signed electronically by: Lyndle Herrlich, MD  [facility]  [facility] [address] [place], [place] [address] Phone: [phone] FAX: [phone]      CC: Alicia Grandchild, MD [address] [place] [address] Phone: [phone]  Fax: [phone]   Return to Endocrinology clinic as below: Future Appointments  Date Time Provider Department Center  05/17/2023  9:20 AM Marguerita Beards, MD [facility]

## 2022-09-05 NOTE — Patient Instructions (Signed)
Decrease Methimazole from 10 mg to 5 mg daily

## 2022-10-03 ENCOUNTER — Telehealth: Payer: Self-pay | Admitting: Internal Medicine

## 2022-10-03 ENCOUNTER — Other Ambulatory Visit: Payer: Self-pay | Admitting: Internal Medicine

## 2022-10-03 DIAGNOSIS — I10 Essential (primary) hypertension: Secondary | ICD-10-CM

## 2022-10-03 DIAGNOSIS — T502X5A Adverse effect of carbonic-anhydrase inhibitors, benzothiadiazides and other diuretics, initial encounter: Secondary | ICD-10-CM

## 2022-10-03 NOTE — Telephone Encounter (Signed)
Patient needs her prescription to be written for the brand name - Vyvanse - she has been unable to pick up her prescriptions because drug store was out of generic Caller & Relationship to patient: Self   Call back number:[phone]   Date of last office visit:  07/2022   Date of next office visit:  11/06/2022   Medication(s) to be refilled: Vyvanse - write for brand name        Preferred Pharmacy:   [facility]

## 2022-10-04 ENCOUNTER — Other Ambulatory Visit: Payer: Self-pay | Admitting: Internal Medicine

## 2022-10-04 DIAGNOSIS — F5081 Binge eating disorder: Secondary | ICD-10-CM

## 2022-10-04 MED ORDER — VYVANSE 50 MG PO CAPS: 50.0000 mg | ORAL_CAPSULE | Freq: Every day | ORAL | 0 refills | Status: DC

## 2022-10-05 ENCOUNTER — Encounter (HOSPITAL_BASED_OUTPATIENT_CLINIC_OR_DEPARTMENT_OTHER): Payer: Self-pay | Admitting: Emergency Medicine

## 2022-10-05 ENCOUNTER — Emergency Department (HOSPITAL_BASED_OUTPATIENT_CLINIC_OR_DEPARTMENT_OTHER)
Admission: EM | Admit: 2022-10-05 | Discharge: 2022-10-05 | Disposition: A | Discharge status: Home / Self Care | Payer: BC Managed Care – PPO | Attending: Emergency Medicine | Admitting: Emergency Medicine

## 2022-10-05 ENCOUNTER — Other Ambulatory Visit: Payer: Self-pay

## 2022-10-05 DIAGNOSIS — Z79899 Other long term (current) drug therapy: Secondary | ICD-10-CM | POA: Diagnosis not present

## 2022-10-05 DIAGNOSIS — I10 Essential (primary) hypertension: Secondary | ICD-10-CM | POA: Insufficient documentation

## 2022-10-05 DIAGNOSIS — Z20822 Contact with and (suspected) exposure to covid-19: Secondary | ICD-10-CM | POA: Diagnosis not present

## 2022-10-05 DIAGNOSIS — J101 Influenza due to other identified influenza virus with other respiratory manifestations: Secondary | ICD-10-CM | POA: Diagnosis not present

## 2022-10-05 DIAGNOSIS — R059 Cough, unspecified: Secondary | ICD-10-CM | POA: Diagnosis present

## 2022-10-05 LAB — RESP PANEL BY RT-PCR (RSV, FLU A&B, COVID)  RVPGX2
Influenza A by PCR: POSITIVE — AB
Influenza B by PCR: NEGATIVE
Resp Syncytial Virus by PCR: NEGATIVE
SARS Coronavirus 2 by RT PCR: NEGATIVE

## 2022-10-05 MED ORDER — BENZONATATE 200 MG PO CAPS: 200.0000 mg | ORAL_CAPSULE | Freq: Three times a day (TID) | ORAL | 0 refills | Status: DC | PRN

## 2022-10-05 MED ORDER — ACETAMINOPHEN 500 MG PO TABS
1000.0000 mg | ORAL_TABLET | Freq: Once | ORAL | Status: AC
  Administered 2022-10-05: 1000 mg via ORAL
  Filled 2022-10-05: qty 2

## 2022-10-05 MED ORDER — AEROCHAMBER PLUS FLO-VU MEDIUM MISC
1.0000 | Freq: Once | Status: AC
  Administered 2022-10-05: 1
  Filled 2022-10-05: qty 1

## 2022-10-05 MED ORDER — ONDANSETRON 4 MG PO TBDP
4.0000 mg | ORAL_TABLET | Freq: Once | ORAL | Status: AC
  Administered 2022-10-05: 4 mg via ORAL
  Filled 2022-10-05: qty 1

## 2022-10-05 MED ORDER — ALBUTEROL SULFATE HFA 108 (90 BASE) MCG/ACT IN AERS
2.0000 | INHALATION_SPRAY | Freq: Once | RESPIRATORY_TRACT | Status: AC
  Administered 2022-10-05: 2 via RESPIRATORY_TRACT
  Filled 2022-10-05: qty 6.7

## 2022-10-05 MED ORDER — ONDANSETRON 4 MG PO TBDP: 4.0000 mg | ORAL_TABLET | ORAL | 0 refills | Status: DC | PRN

## 2022-10-05 MED ORDER — OSELTAMIVIR PHOSPHATE 75 MG PO CAPS: 75.0000 mg | ORAL_CAPSULE | Freq: Two times a day (BID) | ORAL | 0 refills | Status: DC

## 2022-10-05 MED ORDER — HYDROCODONE BIT-HOMATROP MBR 5-1.5 MG/5ML PO SOLN: 5.0000 mL | Freq: Four times a day (QID) | ORAL | 0 refills | Status: DC | PRN

## 2022-10-05 NOTE — Discharge Instructions (Signed)
1.  Take extra strength Tylenol every 6 hours for fever and bodyaches as well as headache.  Controlling fever will help with headache.  Try to stay well-hydrated. 2.  Use the albuterol inhaler 2 puffs every 4-6 hours for wheezing and coughing.  You may also take [employer].  These are not sedating.  Do not chew on them or suck on them.  Swallow them whole.  If you need a another cough medication especially for sleep take the Hycodan syrup.  This is sedating and you should not drive or do other activities when you take it. 3.  You have been given Zofran for nausea.  Take this if needed.  Try to stay well-hydrated. 4.  You have been given a prescription for Tamiflu.  Tamiflu should be taken within 48 hours of onset of symptoms.  It is not shown to be helpful after 48 hours of symptoms.

## 2022-10-05 NOTE — ED Triage Notes (Signed)
Pt arrives to ED with c/o headache, nausea, cough, chest tightness x2 days.

## 2022-10-05 NOTE — ED Provider Notes (Signed)
[facility] EMERGENCY DEPT Provider Note   CSN: [id number] Arrival date & time: 10/05/22  1605     History  Chief Complaint  Patient presents with   Headache   Cough   Nausea    Alicia Hoover is a 46 y.o. female.  HPI Patient reports she was exposed to her grandson who seemed ill.  2 days ago she started getting some cough, nausea and headache.  She reports last night the symptoms got worse and she felt like she was kind of short of breath with coughing and when she tried to go shopping she got some shortness of breath with walking.  She reports she is noting some wheeze with a cough as well.  She also has had a very severe generalized headache.  Nausea but no vomiting.    Home Medications Prior to Admission medications   Medication Sig Start Date End Date Taking? Authorizing Provider  benzonatate (TESSALON) 200 MG capsule Take 1 capsule (200 mg total) by mouth 3 (three) times daily as needed for cough. 10/05/22  Yes Azari Hasler, Lebron Conners, MD  HYDROcodone bit-homatropine (HYCODAN) 5-1.5 MG/5ML syrup Take 5 mLs by mouth every 6 (six) hours as needed for cough. 10/05/22  Yes Arby Barrette, MD  ondansetron (ZOFRAN-ODT) 4 MG disintegrating tablet Take 1 tablet (4 mg total) by mouth every 4 (four) hours as needed for nausea or vomiting. 10/05/22  Yes Arby Barrette, MD  oseltamivir (TAMIFLU) 75 MG capsule Take 1 capsule (75 mg total) by mouth every 12 (twelve) hours. 10/05/22  Yes Arby Barrette, MD  acetaminophen (TYLENOL 8 HOUR) 650 MG CR tablet Take 1 tablet (650 mg total) by mouth every 8 (eight) hours as needed for pain. 10/03/20   Mannie Stabile, PA-C  ciclopirox (LOPROX) 0.77 % cream Apply topically 2 (two) times daily. 11/01/21   Brock Bad, MD  fluocinonide-emollient (LIDEX-E) 0.05 % cream Apply 1 Application topically 2 (two) times daily. 07/25/22   Etta Grandchild, MD  fluticasone (FLONASE) 50 MCG/ACT nasal spray Place 2 sprays into the nose daily.     [provider]  ibuprofen (ADVIL) 800 MG tablet TAKE 1 TABLET BY MOUTH EVERY 8 HOURS AS NEEDED 04/27/22   Brock Bad, MD  levocetirizine (XYZAL) 5 MG tablet TAKE 1 TABLET BY MOUTH EVERY DAY IN THE EVENING 05/03/22   Etta Grandchild, MD  methimazole (TAPAZOLE) 5 MG tablet Take 1 tablet (5 mg total) by mouth daily. 09/05/22   Shamleffer, Konrad Dolores, MD  montelukast (SINGULAIR) 10 MG tablet TAKE 1 TABLET BY MOUTH EVERYDAY AT BEDTIME 06/11/22   Etta Grandchild, MD  Multiple Vitamin (MULTIVITAMIN) tablet Take 1 tablet by mouth daily.    [provider]  norgestimate-ethinyl estradiol (ORTHO-CYCLEN) 0.25-35 MG-MCG tablet Take 1 tablet by mouth daily. 11/01/21   Brock Bad, MD  NURTEC 75 MG TBDP TAKE 1 TABLET BY MOUTH EVERY DAY AS NEEDED 11/02/21   Etta Grandchild, MD  pantoprazole (PROTONIX) 40 MG tablet TAKE 1 TABLET BY MOUTH TWICE A DAY 09/03/22   Etta Grandchild, MD  Semaglutide, 1 MG/DOSE, (OZEMPIC, 1 MG/DOSE,) 4 MG/3ML SOPN INJECT 1MG  INTO THE SKIN ONCE A WEEK Patient not taking: Reported on 09/05/2022 05/28/22   05/30/22, MD  spironolactone (ALDACTONE) 25 MG tablet TAKE 1 TABLET (25 MG TOTAL) BY MOUTH DAILY. 10/03/22   10/05/22, MD  Trospium Chloride 60 MG CP24 Take 1 capsule (60 mg total) by mouth daily. 05/11/22  Marguerita Beards, MD  VYVANSE 50 MG capsule Take 1 capsule (50 mg total) by mouth daily. 10/04/22   Etta Grandchild, MD  dexlansoprazole (DEXILANT) 60 MG capsule Take 1 capsule (60 mg total) by mouth daily. 04/14/21 04/15/21  Etta Grandchild, MD      Allergies    Codeine and Tramadol    Review of Systems   Review of Systems  Physical Exam Updated Vital Signs BP (!) 165/102 (BP Location: Right Arm)   Pulse 71   Temp 98 F (36.7 C)   Resp 18   Ht 5' 5.5" (1.664 m)   Wt 131.1 kg   SpO2 100%   BMI 47.36 kg/m  Physical Exam Constitutional:      Comments: Patient is alert and nontoxic.  No respiratory distress at rest.  Clinically  well in appearance.  HENT:     Mouth/Throat:     Mouth: Mucous membranes are moist.     Pharynx: Oropharynx is clear.  Eyes:     Extraocular Movements: Extraocular movements intact.     Pupils: Pupils are equal, round, and reactive to light.  Cardiovascular:     Rate and Rhythm: Normal rate and regular rhythm.  Pulmonary:     Comments: No respiratory distress at rest.  Intermittent cough.  With forced expiration, expiratory wheeze present. Abdominal:     General: There is no distension.     Palpations: Abdomen is soft.     Tenderness: There is no abdominal tenderness. There is no guarding.  Musculoskeletal:        General: No swelling. Normal range of motion.     Cervical back: Neck supple.     Right lower leg: No edema.     Left lower leg: No edema.  Skin:    General: Skin is warm and dry.  Neurological:     General: No focal deficit present.     Mental Status: She is oriented to person, place, and time.     Motor: No weakness.     Coordination: Coordination normal.  Psychiatric:        Thought Content: Thought content normal.     ED Results / Procedures / Treatments   Labs (all labs ordered are listed, but only abnormal results are displayed) Labs Reviewed  RESP PANEL BY RT-PCR (RSV, FLU A&B, COVID)  RVPGX2 - Abnormal; Notable for the following components:      Result Value   Influenza A by PCR POSITIVE (*)    All other components within normal limits    EKG None  Radiology No results found.  Procedures Procedures    Medications Ordered in ED Medications  albuterol (VENTOLIN HFA) 108 (90 Base) MCG/ACT inhaler 2 puff (has no administration in time range)  AeroChamber Plus Flo-Vu Medium MISC 1 each (has no administration in time range)  acetaminophen (TYLENOL) tablet 1,000 mg (1,000 mg Oral Given 10/05/22 2058)  ondansetron (ZOFRAN-ODT) disintegrating tablet 4 mg (4 mg Oral Given 10/05/22 2058)    ED Course/ Medical Decision Making/ A&P                            Medical Decision Making Risk OTC drugs. Prescription drug management.   Patient presents with constellation of symptoms suggestive of viral illness.  She is clinically well in appearance.  She had sick contact., she tests positive for flu A.  On examination patient does not have respiratory distress but with forced  expiration does have wheeze present.  Will dispense albuterol inhaler with spacer teaching.  Will administer acetaminophen at 1000 mg in the emergency department for headache.  Patient has had symptoms for 24 hours or less.  Will prescribe for Tamiflu.  Prescriptions also provided for Hycodan syrup, Tessalon Perles and Zofran.  Patient is stable without signs of needing admission at this time.  Saturation is 100%, heart rate is in the 70s.  Has history of hypertension.  However, no signs of hypertensive urgency or emergency.  Patient has no confusion and no signs of endorgan damage.  Patient educated on the use of acetaminophen in favor of ibuprofen with known hypertension.  Counseled to monitor blood pressures closely at home.  She does have outpatient provider managing hypertension.  At this time no occasion for medication changes based on blood pressure readings or clinical findings.        Final Clinical Impression(s) / ED Diagnoses Final diagnoses:  Influenza A  Essential hypertension    Rx / DC Orders ED Discharge Orders          Ordered    HYDROcodone bit-homatropine (HYCODAN) 5-1.5 MG/5ML syrup  Every 6 hours PRN        10/05/22 2055    benzonatate (TESSALON) 200 MG capsule  3 times daily PRN        10/05/22 2055    oseltamivir (TAMIFLU) 75 MG capsule  Every 12 hours        10/05/22 2055    ondansetron (ZOFRAN-ODT) 4 MG disintegrating tablet  Every 4 hours PRN        10/05/22 2055              Arby Barrette, MD 10/05/22 2107

## 2022-10-05 NOTE — ED Notes (Signed)
RT educated pt on proper use of MDI w/spacer. Pt able to perform w/out difficulty. Pt respiratory status stable w/no distress noted at this time.  

## 2022-10-10 ENCOUNTER — Encounter: Payer: Self-pay | Admitting: Internal Medicine

## 2022-10-10 ENCOUNTER — Telehealth (INDEPENDENT_AMBULATORY_CARE_PROVIDER_SITE_OTHER): Payer: BC Managed Care – PPO | Admitting: Family Medicine

## 2022-10-10 ENCOUNTER — Encounter: Payer: Self-pay | Admitting: Family Medicine

## 2022-10-10 DIAGNOSIS — Z8709 Personal history of other diseases of the respiratory system: Secondary | ICD-10-CM | POA: Diagnosis not present

## 2022-10-10 DIAGNOSIS — J101 Influenza due to other identified influenza virus with other respiratory manifestations: Secondary | ICD-10-CM

## 2022-10-10 DIAGNOSIS — J45909 Unspecified asthma, uncomplicated: Secondary | ICD-10-CM | POA: Diagnosis not present

## 2022-10-10 NOTE — Telephone Encounter (Signed)
Spoke to pt via phone.   Upon chart review OV scheduled today is with Harland Dingwall, NP. I have changed appt type to MyChart Video Visit. Pt aware.

## 2022-10-10 NOTE — Progress Notes (Signed)
MyChart Video Visit    Virtual Visit via Video Note   This visit type was conducted due to national recommendations for restrictions regarding the COVID-19 Pandemic (e.g. social distancing) in an effort to limit this patient's exposure and mitigate transmission in our community. This patient is at least at moderate risk for complications without adequate follow up. This format is felt to be most appropriate for this patient at this time. Physical exam was limited by quality of the video and audio technology used for the visit. CMA was able to get the patient set up on a video visit.  Patient location: Home. Patient and provider in visit Provider location: Office  I discussed the limitations of evaluation and management by telemedicine and the availability of in person appointments. The patient expressed understanding and agreed to proceed.  Visit Date: 10/10/2022  Today's healthcare provider: Hetty Blend, NP-C     Subjective:    Patient ID: Alicia Hoover, female    DOB: July 23, 1976, 47 y.o.   MRN: [id number]  Chief Complaint  Patient presents with   Nasal Congestion    Tested pos for flu in ED on Friday    HPI  C/o 5 day hx of headache and cough. Chest tightness that has resolved with albuterol inhaler.  No fever, chills, body aches, dizziness, N/V/D.   Tested positive for Flu A on 10/05/2022 She took Tamiflu, albuterol, Tessalon Perles and Hydrocodone cough syrup.  She was prescribed Zofran as well.    States she had asthma as a child. States ED provider recommended PFTs due to reactive airway disease last week with flu.  Reports hx of asthma as a child.      Past Medical History:  Diagnosis Date   Asthma    GERD (gastroesophageal reflux disease)    Hypersomnia with sleep apnea    Hypertension    Migraine    Prediabetes    Rhinitis     Past Surgical History:  Procedure Laterality Date   CHOLECYSTECTOMY     TOOTH EXTRACTION      Family History   Problem Relation Age of Onset   Diabetes Mother    COPD Mother    Rheum arthritis Mother    Diabetes Father    Early death Father    Hyperlipidemia Brother    Thyroid disease Brother    Alcohol abuse Neg Hx    Cancer Neg Hx    Depression Neg Hx    Drug abuse Neg Hx    Hearing loss Neg Hx    Heart disease Neg Hx    Hypertension Neg Hx    Stroke Neg Hx     Social History   Socioeconomic History   Marital status: Widowed    Spouse name: Not on file   Number of children: 3   Years of education: Not on file   Highest education level: Not on file  Occupational History   Occupation: [employer]    Employer: [employer]  Tobacco Use   Smoking status: Former    Packs/day: 0.10    Years: 1.00    Total pack years: 0.10    Types: Cigarettes    Quit date: 10/09/1999    Years since quitting: 23.0   Smokeless tobacco: Never  Vaping Use   Vaping Use: Never used  Substance and Sexual Activity   Alcohol use: Yes    Comment: 1 glass of white wine monthly   Drug use: No   Sexual activity:  Yes    Birth control/protection: Pill  Other Topics Concern   Not on file  Social History Narrative   Not on file   Social Determinants of Health   Financial Resource Strain: Not on file  Food Insecurity: Not on file  Transportation Needs: Not on file  Physical Activity: Not on file  Stress: Not on file  Social Connections: Not on file  Intimate Partner Violence: Not on file    Outpatient Medications Prior to Visit  Medication Sig Dispense Refill   acetaminophen (TYLENOL 8 HOUR) 650 MG CR tablet Take 1 tablet (650 mg total) by mouth every 8 (eight) hours as needed for pain. 15 tablet 0   benzonatate (TESSALON) 200 MG capsule Take 1 capsule (200 mg total) by mouth 3 (three) times daily as needed for cough. 20 capsule 0   ciclopirox (LOPROX) 0.77 % cream Apply topically 2 (two) times daily. 90 g 4   fluocinonide-emollient (LIDEX-E) 0.05 % cream Apply 1 Application topically 2  (two) times daily. 60 g 1   fluticasone (FLONASE) 50 MCG/ACT nasal spray Place 2 sprays into the nose daily.     HYDROcodone bit-homatropine (HYCODAN) 5-1.5 MG/5ML syrup Take 5 mLs by mouth every 6 (six) hours as needed for cough. 120 mL 0   ibuprofen (ADVIL) 800 MG tablet TAKE 1 TABLET BY MOUTH EVERY 8 HOURS AS NEEDED 30 tablet 5   levocetirizine (XYZAL) 5 MG tablet TAKE 1 TABLET BY MOUTH EVERY DAY IN THE EVENING 90 tablet 1   methimazole (TAPAZOLE) 5 MG tablet Take 1 tablet (5 mg total) by mouth daily. 90 tablet 3   montelukast (SINGULAIR) 10 MG tablet TAKE 1 TABLET BY MOUTH EVERYDAY AT BEDTIME 90 tablet 1   Multiple Vitamin (MULTIVITAMIN) tablet Take 1 tablet by mouth daily.     norgestimate-ethinyl estradiol (ORTHO-CYCLEN) 0.25-35 MG-MCG tablet Take 1 tablet by mouth daily. 84 tablet 4   NURTEC 75 MG TBDP TAKE 1 TABLET BY MOUTH EVERY DAY AS NEEDED 8 tablet 3   ondansetron (ZOFRAN-ODT) 4 MG disintegrating tablet Take 1 tablet (4 mg total) by mouth every 4 (four) hours as needed for nausea or vomiting. 20 tablet 0   oseltamivir (TAMIFLU) 75 MG capsule Take 1 capsule (75 mg total) by mouth every 12 (twelve) hours. 10 capsule 0   pantoprazole (PROTONIX) 40 MG tablet TAKE 1 TABLET BY MOUTH TWICE A DAY 180 tablet 1   Semaglutide, 1 MG/DOSE, (OZEMPIC, 1 MG/DOSE,) 4 MG/3ML SOPN INJECT 1MG  INTO THE SKIN ONCE A WEEK 9 mL 0   spironolactone (ALDACTONE) 25 MG tablet TAKE 1 TABLET (25 MG TOTAL) BY MOUTH DAILY. 90 tablet 0   Trospium Chloride 60 MG CP24 Take 1 capsule (60 mg total) by mouth daily. 30 capsule 11   VYVANSE 50 MG capsule Take 1 capsule (50 mg total) by mouth daily. 30 capsule 0   No facility-administered medications prior to visit.    Allergies  Allergen Reactions   Codeine Hives and Nausea And Vomiting   Tramadol Diarrhea and Nausea And Vomiting    ROS     Objective:    Physical Exam  There were no vitals taken for this visit. Wt Readings from Last 3 Encounters:  10/05/22 289  lb (131.1 kg)  09/05/22 296 lb (134.3 kg)  07/25/22 289 lb 2 oz (131.1 kg)   Alert and oriented and in no acute distress.  Respirations unlabored.  Speaking in complete sentences without difficulty.    Assessment & Plan:  Problem List Items Addressed This Visit   None Visit Diagnoses     Influenza A    -  Primary   History of asthma       Relevant Orders   Ambulatory referral to Pulmonology   Reactive airway disease without complication, unspecified asthma severity, unspecified whether persistent       Relevant Orders   Ambulatory referral to Pulmonology      Reviewed ED note from 10/05/2022 and she was positive for influenza A.  Prescribed Tamiflu, albuterol and Tessalon Perles.  She is feeling basically back to baseline today.  Reports history of asthma and requests referral to pulmonology for further evaluation due to having significant reactive airway disease last week with influenza.  States ED provider recommended she have PFTs.  Referral made.  I am having Alicia Hoover maintain her fluticasone, multivitamin, acetaminophen, norgestimate-ethinyl estradiol, ciclopirox, Nurtec, ibuprofen, levocetirizine, Trospium Chloride, Ozempic (1 MG/DOSE), montelukast, fluocinonide-emollient, pantoprazole, methimazole, spironolactone, Vyvanse, HYDROcodone bit-homatropine, benzonatate, oseltamivir, and ondansetron.  No orders of the defined types were placed in this encounter.   I discussed the assessment and treatment plan with the patient. The patient was provided an opportunity to ask questions and all were answered. The patient agreed with the plan and demonstrated an understanding of the instructions.   The patient was advised to call back or seek an in-person evaluation if the symptoms worsen or if the condition fails to improve as anticipated.  I provided 16 minutes of face-to-face time during this encounter.   Harland Dingwall, NP-C [facility] [place] [phone] (phone) [phone] (fax)  [place]

## 2022-10-22 ENCOUNTER — Other Ambulatory Visit: Payer: Self-pay | Admitting: Obstetrics

## 2022-10-22 ENCOUNTER — Other Ambulatory Visit: Payer: Self-pay | Admitting: Internal Medicine

## 2022-10-22 DIAGNOSIS — N946 Dysmenorrhea, unspecified: Secondary | ICD-10-CM

## 2022-11-01 ENCOUNTER — Encounter: Payer: Self-pay | Admitting: Internal Medicine

## 2022-11-01 ENCOUNTER — Ambulatory Visit: Payer: BC Managed Care – PPO | Admitting: Internal Medicine

## 2022-11-01 VITALS — BP 134/82 | HR 84 | Temp 99.1°F | Ht 65.0 in | Wt 307.6 lb

## 2022-11-01 DIAGNOSIS — R059 Cough, unspecified: Secondary | ICD-10-CM

## 2022-11-01 DIAGNOSIS — J45909 Unspecified asthma, uncomplicated: Secondary | ICD-10-CM

## 2022-11-01 LAB — POCT EXHALED NITRIC OXIDE: FeNO level (ppb): 11

## 2022-11-01 NOTE — Progress Notes (Addendum)
Alicia Hoover, female    DOB: 08-16-1976   MRN: [id number]   Brief patient profile:  89  yobf minimal smoking hx stopped in 2001  to pulmonary clinic 11/01/2022 by Jackquline Berlin for asthma evaluation.  Remembers using inhaler daily thru elementary school but by HS no need and played varsity basketball  s breathing problems  but entire life has been bothered by itching sneezy runny nose esp fall = spring saw allergist for shots x one month per [facility] clinic and since then maint on  flonase / xyzal and singulair        History of Present Illness  11/01/2022  Pulmonary/ 1st office eval/Siah Kannan  Chief Complaint  Patient presents with   Consult    Hx of asthma on medical chart.  Patient would like to have this diagnosis removed.  States the only time she has any respiratory sx is if viral for bacterial infections.  Pt had flu 10/08/2022.  Doing well now.  Dyspnea:  slowed down by hip pain/ no aerobic workouts  Cough: none a daily basis Sleep: flat bed / 2 pillows under head  SABA use: says not using any saba since elementary school  Gets thru colds now s any inhaler use at all   No obvious day to day or daytime pattern/variability or assoc excess/ purulent sputum or mucus plugs or hemoptysis or cp or chest tightness, subjective wheeze or overt sinus or hb symptoms.   Sleeping  without nocturnal  or early am exacerbation  of respiratory  c/o's or need for noct saba. Also denies any obvious fluctuation of symptoms with weather or environmental changes or other aggravating or alleviating factors except as outlined above   No unusual exposure hx or h/o childhood pna  or knowledge of premature birth.  Current Allergies, Complete Past Medical History, Past Surgical History, Family History, and Social History were reviewed in [employer] record.  ROS  The following are not active complaints unless bolded Hoarseness, sore throat, dysphagia, dental problems, itching, sneezing,   nasal congestion or discharge of excess mucus or purulent secretions, ear ache,   fever, chills, sweats, unintended wt loss or wt gain, classically pleuritic or exertional cp,  orthopnea pnd or arm/hand swelling  or leg swelling, presyncope, palpitations, abdominal pain, anorexia, nausea, vomiting, diarrhea  or change in bowel habits or change in bladder habits, change in stools or change in urine, dysuria, hematuria,  rash, arthralgias, visual complaints, headache, numbness, weakness or ataxia or problems with walking or coordination,  change in mood or  memory.             Past Medical History:  Diagnosis Date   Asthma    GERD (gastroesophageal reflux disease)    Hypersomnia with sleep apnea    Hypertension    Migraine    Prediabetes    Rhinitis     Outpatient Medications Prior to Visit  Medication Sig Dispense Refill   acetaminophen (TYLENOL 8 HOUR) 650 MG CR tablet Take 1 tablet (650 mg total) by mouth every 8 (eight) hours as needed for pain. 15 tablet 0   amoxicillin (AMOXIL) 500 MG capsule Take 500 mg by mouth every 8 (eight) hours.     chlorhexidine (PERIDEX) 0.12 % solution 2 (two) times daily.     fluocinonide-emollient (LIDEX-E) 0.05 % cream Apply 1 Application topically 2 (two) times daily. 60 g 1   fluticasone (FLONASE) 50 MCG/ACT nasal spray Place 2 sprays into the nose daily.  ibuprofen (ADVIL) 800 MG tablet TAKE 1 TABLET BY MOUTH EVERY 8 HOURS AS NEEDED 30 tablet 5   levocetirizine (XYZAL) 5 MG tablet TAKE 1 TABLET BY MOUTH EVERY DAY IN THE EVENING 90 tablet 1   methimazole (TAPAZOLE) 5 MG tablet Take 1 tablet (5 mg total) by mouth daily. 90 tablet 3   montelukast (SINGULAIR) 10 MG tablet TAKE 1 TABLET BY MOUTH EVERYDAY AT BEDTIME 90 tablet 1   Multiple Vitamin (MULTIVITAMIN) tablet Take 1 tablet by mouth daily.     norgestimate-ethinyl estradiol (ORTHO-CYCLEN) 0.25-35 MG-MCG tablet Take 1 tablet by mouth daily. 84 tablet 4   NURTEC 75 MG TBDP TAKE 1 TABLET BY MOUTH  EVERY DAY AS NEEDED 8 tablet 3   ondansetron (ZOFRAN-ODT) 4 MG disintegrating tablet Take 1 tablet (4 mg total) by mouth every 4 (four) hours as needed for nausea or vomiting. 20 tablet 0   pantoprazole (PROTONIX) 40 MG tablet TAKE 1 TABLET BY MOUTH TWICE A DAY 180 tablet 1   spironolactone (ALDACTONE) 25 MG tablet TAKE 1 TABLET (25 MG TOTAL) BY MOUTH DAILY. 90 tablet 0   Trospium Chloride 60 MG CP24 Take 1 capsule (60 mg total) by mouth daily. 30 capsule 11   VYVANSE 50 MG capsule Take 1 capsule (50 mg total) by mouth daily. 30 capsule 0   benzonatate (TESSALON) 200 MG capsule Take 1 capsule (200 mg total) by mouth 3 (three) times daily as needed for cough. (Patient not taking: Reported on 11/01/2022) 20 capsule 0   ciclopirox (LOPROX) 0.77 % cream Apply topically 2 (two) times daily. (Patient not taking: Reported on 11/01/2022) 90 g 4   HYDROcodone bit-homatropine (HYCODAN) 5-1.5 MG/5ML syrup Take 5 mLs by mouth every 6 (six) hours as needed for cough. (Patient not taking: Reported on 11/01/2022) 120 mL 0   oseltamivir (TAMIFLU) 75 MG capsule Take 1 capsule (75 mg total) by mouth every 12 (twelve) hours. (Patient not taking: Reported on 11/01/2022) 10 capsule 0   Semaglutide, 1 MG/DOSE, (OZEMPIC, 1 MG/DOSE,) 4 MG/3ML SOPN INJECT 1MG  INTO THE SKIN ONCE A WEEK (Patient not taking: Reported on 11/01/2022) 9 mL 0   No facility-administered medications prior to visit.     Objective:     BP 134/82 (BP Location: Left Arm, Patient Position: Sitting, Cuff Size: Large)   Pulse 84   Temp 99.1 F (37.3 C) (Oral)   Ht 5\' 5"  (1.651 m)   Wt (!) 307 lb 9.6 oz (139.5 kg)   SpO2 99%   BMI 51.19 kg/m   SpO2: 99 %  Amb MO (by bmi) bf naad   HEENT : Oropharynx  clear      Nasal turbinates nl    NECK :  without  apparent JVD/ palpable Nodes/TM    LUNGS: no acc muscle use,  Nl contour chest which is clear to A and P bilaterally without cough on insp or exp maneuvers   CV:  RRR  no s3 or murmur or  increase in P2, and no edema   ABD:  soft and nontender with nl inspiratory excursion in the supine position. No bruits or organomegaly appreciated   MS:  Nl gait/ ext warm without deformities Or obvious joint restrictions  calf tenderness, cyanosis or clubbing    SKIN: warm and dry without lesions    NEURO:  alert, approp, nl sensorium with  no motor or cerebellar deficits apparent.       Assessment   Childhood asthma Resolved p elementary school  -  FEN0  11/01/2022  = 10 with nl exam and no need for saba since childhood even with URI's  She does still have evidence of intermittent rhinitis which likely has allergic component but this is well controlled on flonase and singulair and f/u allergy is prn but no need to treat prophylactically for asthma at this point based on her hx or present findings   Also at risk of gerd as she gets older given her morbid obesity/ advised on diet / lifestyle choices  Pulmonary f/u can be prn        Morbid obesity (HCC) Body mass index is 51.19 kg/m.  -    Lab Results  Component Value Date   TSH 4.81 07/25/2022    Contributing to doe and risk of GERD >>>   reviewed the need and the process to achieve and maintain neg calorie balance > defer f/u primary care including intermittently monitoring thyroid status       Each maintenance medication was reviewed in detail including emphasizing most importantly the difference between maintenance and prns and under what circumstances the prns are to be triggered using an action plan format where appropriate.  Total time for H and P, chart review, counseling, reviewing nasal device(s) and generating customized AVS unique to this office visit / same day charting = 35 min with pt new to me  Sandrea Hughs, MD 11/01/2022

## 2022-11-01 NOTE — Patient Instructions (Signed)
Remember to use Afrin 1-2 min before your nasal steroids for 5 days or less   Use nasal irrigation as much as possible   I will change your diagnosis of childhood asthma but that does put you at risk of future   When you have a  flare of flare of cough for any reason  remember to treat reflux aggressively :  Protonix should be Take 30- 60 min before your first and last meals of the day   Tessalon pearls are fine at 200 mg every 6-8 hours as needed   GERD (REFLUX)  is an extremely common cause of respiratory symptoms just like yours , many times with no obvious heartburn at all.    It can be treated with medication, but also with lifestyle changes including elevation of the head of your bed (ideally with 6 -8inch blocks under the headboard of your bed),  Smoking cessation, avoidance of late meals, excessive alcohol, and avoid fatty foods, chocolate, peppermint, colas, red wine, and acidic juices such as orange juice.  NO MINT OR MENTHOL PRODUCTS SO NO COUGH DROPS  USE SUGARLESS CANDY INSTEAD (Jolley ranchers or Stover's or Life Savers) or even ice chips will also do - the key is to swallow to prevent all throat clearing. NO OIL BASED VITAMINS - use powdered substitutes.  Avoid fish oil when coughing.   Pulmonary follow up is as needed

## 2022-11-02 ENCOUNTER — Encounter: Payer: Self-pay | Admitting: Internal Medicine

## 2022-11-02 DIAGNOSIS — J45909 Unspecified asthma, uncomplicated: Secondary | ICD-10-CM | POA: Insufficient documentation

## 2022-11-02 NOTE — Assessment & Plan Note (Signed)
Body mass index is 51.19 kg/m.  -    Lab Results  Component Value Date   TSH 4.81 07/25/2022      Contributing to doe and risk of GERD >>>   reviewed the need and the process to achieve and maintain neg calorie balance > defer f/u primary care including intermittently monitoring thyroid status

## 2022-11-02 NOTE — Assessment & Plan Note (Signed)
Resolved p elementary school  -  FEN0  11/01/2022  = 10 with nl exam and no need for saba since childhood even with URI's  She does still have evidence of intermittent rhinitis which likely has allergic component but this is well controlled on flonase and singulair and f/u allergy is prn but no need to treat prophylactically for asthma at this point based on her hx or present findings   Also at risk of gerd as she gets older given her morbid obesity/ advised on diet / lifestyle choices  Pulmonary f/u can be prn          Each maintenance medication was reviewed in detail including emphasizing most importantly the difference between maintenance and prns and under what circumstances the prns are to be triggered using an action plan format where appropriate.  Total time for H and P, chart review, counseling, reviewing nasal device(s) and generating customized AVS unique to this office visit / same day charting = 35 min with pt new to me

## 2022-11-04 ENCOUNTER — Other Ambulatory Visit: Payer: Self-pay | Admitting: Internal Medicine

## 2022-11-04 DIAGNOSIS — L239 Allergic contact dermatitis, unspecified cause: Secondary | ICD-10-CM

## 2022-11-06 ENCOUNTER — Ambulatory Visit: Payer: BC Managed Care – PPO | Admitting: Internal Medicine

## 2022-11-07 ENCOUNTER — Other Ambulatory Visit: Payer: BC Managed Care – PPO

## 2022-11-12 ENCOUNTER — Encounter: Payer: Self-pay | Admitting: Internal Medicine

## 2022-11-12 ENCOUNTER — Ambulatory Visit: Payer: BC Managed Care – PPO | Admitting: Internal Medicine

## 2022-11-12 VITALS — BP 132/80 | HR 84 | Temp 98.5°F | Resp 16 | Ht 65.0 in | Wt 308.0 lb

## 2022-11-12 DIAGNOSIS — I10 Essential (primary) hypertension: Secondary | ICD-10-CM

## 2022-11-12 DIAGNOSIS — Z0001 Encounter for general adult medical examination with abnormal findings: Secondary | ICD-10-CM

## 2022-11-12 DIAGNOSIS — Z1231 Encounter for screening mammogram for malignant neoplasm of breast: Secondary | ICD-10-CM

## 2022-11-12 MED ORDER — ZEPBOUND 2.5 MG/0.5ML ~~LOC~~ SOAJ: 2.5000 mg | SUBCUTANEOUS | 0 refills | Status: DC

## 2022-11-12 NOTE — Progress Notes (Signed)
Subjective:  Patient ID: Alicia Hoover, female    DOB: [birth year]  Age: 47 y.o. MRN: [id number]  CC: Annual Exam and Hypertension   HPI Alicia Hoover presents for a CPX and f/up ----  She would like to start using an injectable medication to help her lose weight.  She is active and denies chest pain, diaphoresis, or edema.  Outpatient Medications Prior to Visit  Medication Sig Dispense Refill   acetaminophen (TYLENOL 8 HOUR) 650 MG CR tablet Take 1 tablet (650 mg total) by mouth every 8 (eight) hours as needed for pain. 15 tablet 0   amoxicillin (AMOXIL) 500 MG capsule Take 500 mg by mouth every 8 (eight) hours.     chlorhexidine (PERIDEX) 0.12 % solution 2 (two) times daily.     fluocinonide-emollient (LIDEX-E) 0.05 % cream Apply 1 Application topically 2 (two) times daily. 60 g 1   fluticasone (FLONASE) 50 MCG/ACT nasal spray Place 2 sprays into the nose daily.     HYDROcodone-acetaminophen (NORCO/VICODIN) 5-325 MG tablet Take 1 tablet by mouth every 4 (four) hours as needed.     ibuprofen (ADVIL) 800 MG tablet TAKE 1 TABLET BY MOUTH EVERY 8 HOURS AS NEEDED 30 tablet 5   levocetirizine (XYZAL) 5 MG tablet TAKE 1 TABLET BY MOUTH EVERY DAY IN THE EVENING 90 tablet 1   methimazole (TAPAZOLE) 5 MG tablet Take 1 tablet (5 mg total) by mouth daily. 90 tablet 3   montelukast (SINGULAIR) 10 MG tablet TAKE 1 TABLET BY MOUTH EVERYDAY AT BEDTIME 90 tablet 1   Multiple Vitamin (MULTIVITAMIN) tablet Take 1 tablet by mouth daily.     norgestimate-ethinyl estradiol (ORTHO-CYCLEN) 0.25-35 MG-MCG tablet Take 1 tablet by mouth daily. 84 tablet 4   NURTEC 75 MG TBDP TAKE 1 TABLET BY MOUTH EVERY DAY AS NEEDED 8 tablet 3   pantoprazole (PROTONIX) 40 MG tablet TAKE 1 TABLET BY MOUTH TWICE A DAY 180 tablet 1   penicillin v potassium (VEETID) 500 MG tablet Take 500 mg by mouth 4 (four) times daily.     spironolactone (ALDACTONE) 25 MG tablet TAKE 1 TABLET (25 MG TOTAL) BY MOUTH DAILY. 90 tablet 0    Trospium Chloride 60 MG CP24 Take 1 capsule (60 mg total) by mouth daily. 30 capsule 11   ondansetron (ZOFRAN-ODT) 4 MG disintegrating tablet Take 1 tablet (4 mg total) by mouth every 4 (four) hours as needed for nausea or vomiting. 20 tablet 0   VYVANSE 50 MG capsule Take 1 capsule (50 mg total) by mouth daily. 30 capsule 0   No facility-administered medications prior to visit.    ROS Review of Systems  Constitutional:  Positive for activity change and unexpected weight change (wt gain). Negative for appetite change, chills, diaphoresis and fatigue.  HENT: Negative.    Eyes: Negative.   Respiratory:  Negative for cough, chest tightness, shortness of breath and wheezing.   Cardiovascular:  Negative for chest pain, palpitations and leg swelling.  Gastrointestinal:  Negative for abdominal pain, constipation, diarrhea, nausea and vomiting.  Endocrine: Negative.   Genitourinary: Negative.  Negative for difficulty urinating.  Musculoskeletal:  Negative for arthralgias, joint swelling and myalgias.  Skin: Negative.   Neurological:  Negative for dizziness, weakness and headaches.  Hematological:  Negative for adenopathy. Does not bruise/bleed easily.  Psychiatric/Behavioral: Negative.      Objective:  BP 132/80 (BP Location: Left Arm, Patient Position: Sitting, Cuff Size: Large)   Pulse 84   Temp 98.5 F (36.9  C) (Oral)   Resp 16   Ht 5' 5"$  (1.651 m)   Wt (!) 308 lb (139.7 kg)   LMP 10/23/2022 (Approximate)   SpO2 94%   BMI 51.25 kg/m   BP Readings from Last 3 Encounters:  11/12/22 132/80  11/01/22 134/82  10/05/22 (!) 156/86    Wt Readings from Last 3 Encounters:  11/12/22 (!) 308 lb (139.7 kg)  11/01/22 (!) 307 lb 9.6 oz (139.5 kg)  10/05/22 289 lb (131.1 kg)    Physical Exam Constitutional:      General: She is not in acute distress.    Appearance: She is obese. She is not toxic-appearing or diaphoretic.  HENT:     Nose: Nose normal.     Mouth/Throat:     Mouth:  Mucous membranes are moist.  Eyes:     General: No scleral icterus.    Conjunctiva/sclera: Conjunctivae normal.  Cardiovascular:     Rate and Rhythm: Normal rate and regular rhythm.     Heart sounds: No murmur heard.    No gallop.  Pulmonary:     Effort: Pulmonary effort is normal.     Breath sounds: No stridor. No wheezing, rhonchi or rales.  Abdominal:     General: Abdomen is protuberant. Bowel sounds are normal. There is no distension.     Palpations: Abdomen is soft. There is no hepatomegaly, splenomegaly or mass.     Tenderness: There is no abdominal tenderness.  Musculoskeletal:        General: Normal range of motion.     Cervical back: Neck supple.     Right lower leg: No edema.     Left lower leg: No edema.  Lymphadenopathy:     Cervical: No cervical adenopathy.  Skin:    General: Skin is warm and dry.  Neurological:     General: No focal deficit present.     Mental Status: She is alert.  Psychiatric:        Mood and Affect: Mood normal.        Behavior: Behavior normal.     Lab Results  Component Value Date   WBC 9.8 07/25/2022   HGB 13.3 07/25/2022   HCT 40.6 07/25/2022   PLT 364.0 07/25/2022   GLUCOSE 76 07/25/2022   CHOL 138 10/05/2021   TRIG 140.0 10/05/2021   HDL 35.80 (L) 10/05/2021   LDLDIRECT 57.0 03/27/2017   LDLCALC 74 10/05/2021   ALT 15 10/05/2021   AST 16 10/05/2021   NA 136 07/25/2022   K 4.1 07/25/2022   CL 102 07/25/2022   CREATININE 0.88 07/25/2022   BUN 14 07/25/2022   CO2 27 07/25/2022   TSH 4.81 07/25/2022   HGBA1C 5.9 07/25/2022    No results found.  Assessment & Plan:   Alicia Hoover was seen today for annual exam and hypertension.  Diagnoses and all orders for this visit:  Essential hypertension, benign- Her blood pressure is adequately well-controlled.  Morbid obesity ([place]) -     tirzepatide (ZEPBOUND) 2.5 MG/0.5ML Pen; Inject 2.5 mg into the skin once a week.  Encounter for general adult medical examination with abnormal  findings- Exam completed, labs reviewed, vaccines reviewed, cancer screenings addressed, patient education was given.  Visit for screening mammogram -     MM DIGITAL SCREENING BILATERAL; Future   I have discontinued Alicia Hoover's Vyvanse and ondansetron. I am also having her start on Zepbound. Additionally, I am having her maintain her fluticasone, multivitamin, acetaminophen, norgestimate-ethinyl estradiol, Nurtec, Trospium  Chloride, montelukast, fluocinonide-emollient, pantoprazole, methimazole, spironolactone, ibuprofen, amoxicillin, chlorhexidine, levocetirizine, HYDROcodone-acetaminophen, and penicillin v potassium.  Meds ordered this encounter  Medications   tirzepatide (ZEPBOUND) 2.5 MG/0.5ML Pen    Sig: Inject 2.5 mg into the skin once a week.    Dispense:  2 mL    Refill:  0     Follow-up: Return in about 6 months (around 05/13/2023).  Scarlette Calico, MD

## 2022-11-12 NOTE — Patient Instructions (Addendum)
ZEPBOUND can make your OCP less effective. Please use an alternative form of contraception if you are at risk of pregnancy.   Health Maintenance, Female Adopting a healthy lifestyle and getting preventive care are important in promoting health and wellness. Ask your health care provider about: The right schedule for you to have regular tests and exams. Things you can do on your own to prevent diseases and keep yourself healthy. What should I know about diet, weight, and exercise? Eat a healthy diet  Eat a diet that includes plenty of vegetables, fruits, low-fat dairy products, and lean protein. Do not eat a lot of foods that are high in solid fats, added sugars, or sodium. Maintain a healthy weight Body mass index (BMI) is used to identify weight problems. It estimates body fat based on height and weight. Your health care provider can help determine your BMI and help you achieve or maintain a healthy weight. Get regular exercise Get regular exercise. This is one of the most important things you can do for your health. Most adults should: Exercise for at least 150 minutes each week. The exercise should increase your heart rate and make you sweat (moderate-intensity exercise). Do strengthening exercises at least twice a week. This is in addition to the moderate-intensity exercise. Spend less time sitting. Even light physical activity can be beneficial. Watch cholesterol and blood lipids Have your blood tested for lipids and cholesterol at 48 years of age, then have this test every 5 years. Have your cholesterol levels checked more often if: Your lipid or cholesterol levels are high. You are older than 48 years of age. You are at high risk for heart disease. What should I know about cancer screening? Depending on your health history and family history, you may need to have cancer screening at various ages. This may include screening for: Breast cancer. Cervical cancer. Colorectal  cancer. Skin cancer. Lung cancer. What should I know about heart disease, diabetes, and high blood pressure? Blood pressure and heart disease High blood pressure causes heart disease and increases the risk of stroke. This is more likely to develop in people who have high blood pressure readings or are overweight. Have your blood pressure checked: Every 3-5 years if you are 68-32 years of age. Every year if you are 50 years old or older. Diabetes Have regular diabetes screenings. This checks your fasting blood sugar level. Have the screening done: Once every three years after age 63 if you are at a normal weight and have a low risk for diabetes. More often and at a younger age if you are overweight or have a high risk for diabetes. What should I know about preventing infection? Hepatitis B If you have a higher risk for hepatitis B, you should be screened for this virus. Talk with your health care provider to find out if you are at risk for hepatitis B infection. Hepatitis C Testing is recommended for: Everyone born from [birth year] through 1966. Anyone with known risk factors for hepatitis C. Sexually transmitted infections (STIs) Get screened for STIs, including gonorrhea and chlamydia, if: You are sexually active and are younger than 48 years of age. You are older than 48 years of age and your health care provider tells you that you are at risk for this type of infection. Your sexual activity has changed since you were last screened, and you are at increased risk for chlamydia or gonorrhea. Ask your health care provider if you are at risk. Ask your health care  provider about whether you are at high risk for HIV. Your health care provider may recommend a prescription medicine to help prevent HIV infection. If you choose to take medicine to prevent HIV, you should first get tested for HIV. You should then be tested every 3 months for as long as you are taking the medicine. Pregnancy If you are  about to stop having your period (premenopausal) and you may become pregnant, seek counseling before you get pregnant. Take 400 to 800 micrograms (mcg) of folic acid every day if you become pregnant. Ask for birth control (contraception) if you want to prevent pregnancy. Osteoporosis and menopause Osteoporosis is a disease in which the bones lose minerals and strength with aging. This can result in bone fractures. If you are 56 years old or older, or if you are at risk for osteoporosis and fractures, ask your health care provider if you should: Be screened for bone loss. Take a calcium or vitamin D supplement to lower your risk of fractures. Be given hormone replacement therapy (HRT) to treat symptoms of menopause. Follow these instructions at home: Alcohol use Do not drink alcohol if: Your health care provider tells you not to drink. You are pregnant, may be pregnant, or are planning to become pregnant. If you drink alcohol: Limit how much you have to: 0-1 drink a day. Know how much alcohol is in your drink. In the [place] one drink equals one 12 oz bottle of beer (355 mL), one 5 oz glass of wine (148 mL), or one 1 oz glass of hard liquor (44 mL). Lifestyle Do not use any products that contain nicotine or tobacco. These products include cigarettes, chewing tobacco, and vaping devices, such as e-cigarettes. If you need help quitting, ask your health care provider. Do not use street drugs. Do not share needles. Ask your health care provider for help if you need support or information about quitting drugs. General instructions Schedule regular health, dental, and eye exams. Stay current with your vaccines. Tell your health care provider if: You often feel depressed. You have ever been abused or do not feel safe at home. Summary Adopting a healthy lifestyle and getting preventive care are important in promoting health and wellness. Follow your health care provider's instructions about  healthy diet, exercising, and getting tested or screened for diseases. Follow your health care provider's instructions on monitoring your cholesterol and blood pressure. This information is not intended to replace advice given to you by your health care provider. Make sure you discuss any questions you have with your health care provider. Document Revised: 02/13/2021 Document Reviewed: 02/13/2021 Elsevier Patient Education  [place].

## 2022-11-16 ENCOUNTER — Encounter: Payer: Self-pay | Admitting: Internal Medicine

## 2022-11-20 ENCOUNTER — Encounter: Payer: Self-pay | Admitting: Family Medicine

## 2022-11-20 ENCOUNTER — Ambulatory Visit: Payer: BC Managed Care – PPO

## 2022-11-20 ENCOUNTER — Other Ambulatory Visit: Payer: Self-pay

## 2022-11-20 ENCOUNTER — Telehealth (INDEPENDENT_AMBULATORY_CARE_PROVIDER_SITE_OTHER): Payer: BC Managed Care – PPO | Admitting: Family Medicine

## 2022-11-20 ENCOUNTER — Other Ambulatory Visit (INDEPENDENT_AMBULATORY_CARE_PROVIDER_SITE_OTHER): Payer: BC Managed Care – PPO

## 2022-11-20 DIAGNOSIS — J029 Acute pharyngitis, unspecified: Secondary | ICD-10-CM

## 2022-11-20 DIAGNOSIS — J020 Streptococcal pharyngitis: Secondary | ICD-10-CM

## 2022-11-20 DIAGNOSIS — R6889 Other general symptoms and signs: Secondary | ICD-10-CM

## 2022-11-20 LAB — POCT INFLUENZA A/B
Influenza A, POC: NEGATIVE
Influenza B, POC: NEGATIVE

## 2022-11-20 LAB — POCT RAPID STREP A (OFFICE): Rapid Strep A Screen: POSITIVE — AB

## 2022-11-20 LAB — POC COVID19 BINAXNOW: SARS Coronavirus 2 Ag: NEGATIVE

## 2022-11-20 MED ORDER — OSELTAMIVIR PHOSPHATE 75 MG PO CAPS: 75.0000 mg | ORAL_CAPSULE | Freq: Two times a day (BID) | ORAL | 0 refills | Status: DC

## 2022-11-20 MED ORDER — BENZONATATE 100 MG PO CAPS: ORAL_CAPSULE | ORAL | 0 refills | Status: DC

## 2022-11-20 MED ORDER — AMOXICILLIN 500 MG PO CAPS: 500.0000 mg | ORAL_CAPSULE | Freq: Two times a day (BID) | ORAL | 0 refills | Status: AC

## 2022-11-20 NOTE — Patient Instructions (Signed)
   ---------------------------------------------------------------------------------------------------------------------------      WORK SLIP:  Patient Alicia Hoover,  11-28-1975, was seen for a medical visit today, 11/20/22 . Please excuse from work for illness. May return to work once fever is resolved for 24 hours, has been on antibiotic for 24 hours and is feeling better.  Sincerely: E-signature: Dr. Colin Benton, DO [place] Ph: [phone]   ------------------------------------------------------------------------------------------------------------------------------   -I sent the medication(s) we discussed to your pharmacy: Meds ordered this encounter  Medications   benzonatate (TESSALON PERLES) 100 MG capsule    Sig: 1-2 capsules up to twice daily as needed for cough.    Dispense:  30 capsule    Refill:  0   amoxicillin (AMOXIL) 500 MG capsule    Sig: Take 1 capsule (500 mg total) by mouth 2 (two) times daily for 10 days.    Dispense:  20 capsule    Refill:  0     I hope you are feeling better soon!  Seek in person care promptly if your symptoms worsen, new concerns arise or you are not improving with treatment.  It was nice to meet you today. I help [facility] out with telemedicine visits on Tuesdays and Thursdays and am happy to help if you need a virtual follow up visit on those days. Otherwise, if you have any concerns or questions following this visit please schedule a follow up visit with your Primary Care office or seek care at a local urgent care clinic to avoid delays in care. If you are having severe or life threatening symptoms please call 911 and/or go to the nearest emergency room.

## 2022-11-20 NOTE — Progress Notes (Signed)
Virtual Visit via Video Note  I connected with Alicia Hoover  on 11/20/22 at 10:40 AM EST by a video enabled telemedicine application and verified that I am speaking with the correct person using two identifiers.  Location patient: [place] Location provider:work or home office Persons participating in the virtual visit: patient, provider  I discussed the limitations and requested verbal permission for telemedicine visit. The patient expressed understanding and agreed to proceed.   HPI:  Acute telemedicine visit for really Sore Throat: -Onset: yesterday -Symptoms include:sore throat, cough, laryngitis, subjective fever, nausea, has asthma and sometimes needs albuterol when sick, has felt like is having a little SOB at times with this - but has not felt needed albuterol yet, HA, body aches last night -main symptoms is the sore throat -Denies: CP, SOB now, vomiting, diarrhea -denies any chance of pregnancy -a client had the flu and she was around them closely in the last few days -Pertinent past medical history: see below -Pertinent medication allergies: Allergies  Allergen Reactions   Codeine Hives and Nausea And Vomiting   Tramadol Diarrhea and Nausea And Vomiting   -COVID-19 vaccine status:  Immunization History  Administered Date(s) Administered   Influenza Whole 07/08/2010   PFIZER(Purple Top)SARS-COV-2 Vaccination 02/20/2020, 03/14/2020   Tdap 04/23/2007, 04/08/2019     ROS: See pertinent positives and negatives per HPI.  Past Medical History:  Diagnosis Date   Asthma    GERD (gastroesophageal reflux disease)    Hypersomnia with sleep apnea    Hypertension    Migraine    Prediabetes    Rhinitis     Past Surgical History:  Procedure Laterality Date   CHOLECYSTECTOMY     TOOTH EXTRACTION       Current Outpatient Medications:    acetaminophen (TYLENOL 8 HOUR) 650 MG CR tablet, Take 1 tablet (650 mg total) by mouth every 8 (eight) hours as needed for pain., Disp: 15  tablet, Rfl: 0   amoxicillin (AMOXIL) 500 MG capsule, Take 1 capsule (500 mg total) by mouth 2 (two) times daily for 10 days., Disp: 20 capsule, Rfl: 0   benzonatate (TESSALON PERLES) 100 MG capsule, 1-2 capsules up to twice daily as needed for cough., Disp: 30 capsule, Rfl: 0   chlorhexidine (PERIDEX) 0.12 % solution, 2 (two) times daily., Disp: , Rfl:    fluocinonide-emollient (LIDEX-E) 0.05 % cream, Apply 1 Application topically 2 (two) times daily., Disp: 60 g, Rfl: 1   fluticasone (FLONASE) 50 MCG/ACT nasal spray, Place 2 sprays into the nose daily., Disp: , Rfl:    ibuprofen (ADVIL) 800 MG tablet, TAKE 1 TABLET BY MOUTH EVERY 8 HOURS AS NEEDED, Disp: 30 tablet, Rfl: 5   levocetirizine (XYZAL) 5 MG tablet, TAKE 1 TABLET BY MOUTH EVERY DAY IN THE EVENING, Disp: 90 tablet, Rfl: 1   methimazole (TAPAZOLE) 5 MG tablet, Take 1 tablet (5 mg total) by mouth daily., Disp: 90 tablet, Rfl: 3   montelukast (SINGULAIR) 10 MG tablet, TAKE 1 TABLET BY MOUTH EVERYDAY AT BEDTIME, Disp: 90 tablet, Rfl: 1   Multiple Vitamin (MULTIVITAMIN) tablet, Take 1 tablet by mouth daily., Disp: , Rfl:    norgestimate-ethinyl estradiol (ORTHO-CYCLEN) 0.25-35 MG-MCG tablet, Take 1 tablet by mouth daily., Disp: 84 tablet, Rfl: 4   oseltamivir (TAMIFLU) 75 MG capsule, Take 1 capsule (75 mg total) by mouth 2 (two) times daily., Disp: 10 capsule, Rfl: 0   pantoprazole (PROTONIX) 40 MG tablet, TAKE 1 TABLET BY MOUTH TWICE A DAY, Disp: 180 tablet, Rfl: 1  spironolactone (ALDACTONE) 25 MG tablet, TAKE 1 TABLET (25 MG TOTAL) BY MOUTH DAILY., Disp: 90 tablet, Rfl: 0   Trospium Chloride 60 MG CP24, Take 1 capsule (60 mg total) by mouth daily., Disp: 30 capsule, Rfl: 11   HYDROcodone-acetaminophen (NORCO/VICODIN) 5-325 MG tablet, Take 1 tablet by mouth every 4 (four) hours as needed. (Patient not taking: Reported on 11/20/2022), Disp: , Rfl:    NURTEC 75 MG TBDP, TAKE 1 TABLET BY MOUTH EVERY DAY AS NEEDED (Patient not taking: Reported  on 11/20/2022), Disp: 8 tablet, Rfl: 3   tirzepatide (ZEPBOUND) 2.5 MG/0.5ML Pen, Inject 2.5 mg into the skin once a week. (Patient not taking: Reported on 11/20/2022), Disp: 2 mL, Rfl: 0  EXAM:  VITALS per patient if applicable:  GENERAL: alert, oriented, appears well and in no acute distress  HEENT: atraumatic, conjunttiva clear, no obvious abnormalities on inspection of external nose and ears, unable to visuallize post oropharynx on the virtual visit due to tongue.   NECK: normal movements of the head and neck  LUNGS: on inspection no signs of respiratory distress, breathing rate appears normal, no obvious gross SOB, gasping or wheezing  CV: no obvious cyanosis  MS: moves all visible extremities without noticeable abnormality  PSYCH/NEURO: pleasant and cooperative, no obvious depression or anxiety, speech and thought processing grossly intact  ASSESSMENT AND PLAN:  Discussed the following assessment and plan:  Flu-like symptoms  Sore throat  Strep throat  -we discussed possible serious and likely etiologies, options for evaluation and workup, limitations of telemedicine visit vs in person visit, treatment, treatment risks and precautions. Pt is agreeable to treatment via telemedicine at this moment. Query influenza (she had influenza earlier but I have seen folks having more than once in the same year as various strains are ciculating), covid, other viral illness vs other. Also discussed strep testing and she wants to do. Agreed since very sore throat and I am not able to get a good look at throat. She also reports swollen glands. Staff is going to contact office to see if they would be willing to bring pt in for flu/covid and strep testing. If they are unable [facility]. In interim she wants to start Tamiflu and agrees to stop covid or strep test +. Also discussed alb use and advised inperson care if any further SOB and does not respond to alb.   Work/School slipped offered: provided in patient instructions   Advised to seek prompt virtual visit or in person care if worsening, new symptoms arise, or if is not improving with treatment as expected per our conversation of expected course. Discussed options for follow up care. Did let this patient know that I do telemedicine on Tuesdays and Thursdays for [facility] and those are the days I am logged into the system. Advised to schedule follow up visit with PCP, [facility] [place] virtual visits or [facility] if any further questions or concerns to avoid delays in care.   I discussed the assessment and treatment plan with the patient. The patient was provided an opportunity to ask questions and all were answered. The patient agreed with the plan and demonstrated an understanding of the instructions.   Addendum: flu and covid testing neg. Strep testing positive. Called pt. She opted to try amoxicillin - sent to pharmacy. Agrees to follow up if worsening or not improving in 24-48 hours or if symptoms persist or recur despite tx. Advised to stop tamiflu.   Lucretia Kern, DO

## 2022-12-24 ENCOUNTER — Other Ambulatory Visit: Payer: Self-pay | Admitting: Obstetrics

## 2022-12-24 DIAGNOSIS — Z3041 Encounter for surveillance of contraceptive pills: Secondary | ICD-10-CM

## 2022-12-25 ENCOUNTER — Other Ambulatory Visit: Payer: Self-pay | Admitting: Internal Medicine

## 2022-12-25 DIAGNOSIS — E876 Hypokalemia: Secondary | ICD-10-CM

## 2022-12-25 DIAGNOSIS — I10 Essential (primary) hypertension: Secondary | ICD-10-CM

## 2023-01-11 ENCOUNTER — Telehealth: Payer: Self-pay | Admitting: Internal Medicine

## 2023-01-11 MED ORDER — BENZONATATE 200 MG PO CAPS: 200.0000 mg | ORAL_CAPSULE | Freq: Three times a day (TID) | ORAL | 1 refills | Status: DC | PRN

## 2023-01-11 NOTE — Telephone Encounter (Signed)
Possibly related to allergies vs viral. Recommend COVID testing. She can use mucinex DM OTC Twice daily for cough/congestion. She should try starting daily allergy pill and flonase nasal spray as sometimes the cough can be related to postnasal drainage. Ok to send in benzonatate 200 mg 1 capsule Three times a day as needed for cough. If no improvement, she should come in for further evaluation. Thanks.

## 2023-01-11 NOTE — Telephone Encounter (Signed)
[facility] [address] is Pharm  PT not feeling well. Feels like something is in her chest. Inhaler not helping. No appts avail. Please call to advise. TY.

## 2023-01-11 NOTE — Telephone Encounter (Signed)
Spoke with patient. Advised she needs to get covid tested. Patient advises she is on allergy pill daily . I confirmed pharmacy to send in tessalon pearls for cough. Advised patient to call back next week if no improvement so she can be seen in office.  She verbalized understanding. NFN

## 2023-01-11 NOTE — Telephone Encounter (Signed)
Spoke with patient she complains of SOB, chest congestion, productive cough with greenish-yellow phlegm. Symptoms started 3 days ago Has used cough drops and albuterol inhaler. Nothing is giving her relief.  Katie please advise since Dr. Sherene Sires is not available   Pharmacy [facility] [address]

## 2023-01-11 NOTE — Telephone Encounter (Signed)
PT ret call. Explained what Cherina notes. I verified her number and was able to reach her. Please try again. Thank you so much.   [phone]

## 2023-01-11 NOTE — Telephone Encounter (Signed)
Called twice but call would not connect. Will attempt to call back later.

## 2023-02-05 ENCOUNTER — Other Ambulatory Visit: Payer: Self-pay | Admitting: Internal Medicine

## 2023-02-05 DIAGNOSIS — L233 Allergic contact dermatitis due to drugs in contact with skin: Secondary | ICD-10-CM

## 2023-02-27 ENCOUNTER — Encounter: Payer: Self-pay | Admitting: Physician Assistant

## 2023-02-27 ENCOUNTER — Ambulatory Visit: Payer: BC Managed Care – PPO | Attending: Physician Assistant | Admitting: Physician Assistant

## 2023-02-27 VITALS — BP 156/101 | HR 67 | Ht 65.5 in | Wt 315.0 lb

## 2023-02-27 DIAGNOSIS — I493 Ventricular premature depolarization: Secondary | ICD-10-CM

## 2023-02-27 DIAGNOSIS — I491 Atrial premature depolarization: Secondary | ICD-10-CM

## 2023-02-27 DIAGNOSIS — Z8249 Family history of ischemic heart disease and other diseases of the circulatory system: Secondary | ICD-10-CM | POA: Diagnosis not present

## 2023-02-27 DIAGNOSIS — I10 Essential (primary) hypertension: Secondary | ICD-10-CM | POA: Diagnosis not present

## 2023-02-27 NOTE — Progress Notes (Signed)
Cardiology Office Note Date:  02/27/2023  Patient ID:  Alicia Hoover, Alicia Hoover 1976/04/13, MRN [id number] PCP:  Etta Grandchild, MD  Electrophysiologist: Dr. Johney Frame   Chief Complaint: over due annual visit  History of Present Illness: Alicia Hoover is a 47 y.o. female with history of HTN, asthma, GERD, DM, obesity, OSA, HTN  She saw Dr. Johney Frame 09/10/2018, reported daily palpitations, lasting seconds multiple times a day, PACs on EKG. PACs/PVCs known for her discussed lifestyle modifications and nadolol was added  Reports of some DOE, with negative ETT and echo, felt 2/2 deconditioned state  She saw Mardelle Matte 11/10/19, new dx of Grave's disease at that time numbers improving, also had COVID with no return of smell/taste yet, more SOB since COVID as well, working on weight loss. Nadolol continued, encouraged exercise, weight loss, conditioning efforts, she was motivated, encouraged CPAP compliance as well.   TODAY She comes accompanied by her daughter Since her Grave's disease has been controlled, her nadolol stopped and remains free of palpitations A couple weeks ago she had a central chest ache, at rest, no radiation or associated symptoms, resolved in a couple minutes, has not happened again No CP otherwise, reports good exertional capacity No SOB No near syncope or syncope.  BP is elevated, though she reports this is unusual and has not taken her spironolactone today  Her younger brother recently died of an ruptured aortic aneurysm and she was told to discuss that with [place] for screening  Past Medical History:  Diagnosis Date   Asthma    GERD (gastroesophageal reflux disease)    Hypersomnia with sleep apnea    Hypertension    Migraine    Prediabetes    Rhinitis     Past Surgical History:  Procedure Laterality Date   CHOLECYSTECTOMY     TOOTH EXTRACTION      Current Outpatient Medications  Medication Sig Dispense Refill   acetaminophen (TYLENOL 8 HOUR) 650 MG CR tablet  Take 1 tablet (650 mg total) by mouth every 8 (eight) hours as needed for pain. 15 tablet 0   benzonatate (TESSALON PERLES) 100 MG capsule 1-2 capsules up to twice daily as needed for cough. 30 capsule 0   benzonatate (TESSALON) 200 MG capsule Take 1 capsule (200 mg total) by mouth 3 (three) times daily as needed for cough. 30 capsule 1   chlorhexidine (PERIDEX) 0.12 % solution 2 (two) times daily.     fluocinonide-emollient (LIDEX-E) 0.05 % cream APPLY TO AFFECTED AREA TWICE A DAY 60 g 1   fluticasone (FLONASE) 50 MCG/ACT nasal spray Place 2 sprays into the nose daily.     HYDROcodone-acetaminophen (NORCO/VICODIN) 5-325 MG tablet Take 1 tablet by mouth every 4 (four) hours as needed. (Patient not taking: Reported on 11/20/2022)     ibuprofen (ADVIL) 800 MG tablet TAKE 1 TABLET BY MOUTH EVERY 8 HOURS AS NEEDED 30 tablet 5   levocetirizine (XYZAL) 5 MG tablet TAKE 1 TABLET BY MOUTH EVERY DAY IN THE EVENING 90 tablet 1   methimazole (TAPAZOLE) 5 MG tablet Take 1 tablet (5 mg total) by mouth daily. 90 tablet 3   montelukast (SINGULAIR) 10 MG tablet TAKE 1 TABLET BY MOUTH EVERYDAY AT BEDTIME 90 tablet 1   Multiple Vitamin (MULTIVITAMIN) tablet Take 1 tablet by mouth daily.     norgestimate-ethinyl estradiol (ORTHO-CYCLEN) 0.25-35 MG-MCG tablet TAKE 1 TABLET BY MOUTH EVERY DAY 84 tablet 4   NURTEC 75 MG TBDP TAKE 1 TABLET BY MOUTH  EVERY DAY AS NEEDED (Patient not taking: Reported on 11/20/2022) 8 tablet 3   oseltamivir (TAMIFLU) 75 MG capsule Take 1 capsule (75 mg total) by mouth 2 (two) times daily. 10 capsule 0   pantoprazole (PROTONIX) 40 MG tablet TAKE 1 TABLET BY MOUTH TWICE A DAY 180 tablet 1   spironolactone (ALDACTONE) 25 MG tablet TAKE 1 TABLET (25 MG TOTAL) BY MOUTH DAILY. 90 tablet 0   tirzepatide (ZEPBOUND) 2.5 MG/0.5ML Pen Inject 2.5 mg into the skin once a week. (Patient not taking: Reported on 11/20/2022) 2 mL 0   Trospium Chloride 60 MG CP24 Take 1 capsule (60 mg total) by mouth daily. 30  capsule 11   No current facility-administered medications for this visit.    Allergies:   Codeine and Tramadol   Social History:  The patient  reports that she quit smoking about 23 years ago. Her smoking use included cigarettes. She has a 0.10 pack-year smoking history. She has never used smokeless tobacco. She reports current alcohol use. She reports that she does not use drugs.   Family History:  The patient's family history includes COPD in her mother; Diabetes in her father and mother; Early death in her father; Hyperlipidemia in her brother; Rheum arthritis in her mother; Thyroid disease in her brother.  ROS:  Please see the history of present illness.    All other systems are reviewed and otherwise negative.   PHYSICAL EXAM:  VS:  There were no vitals taken for this visit. BMI: There is no height or weight on file to calculate BMI. Well nourished, well developed, in no acute distress HEENT: normocephalic, atraumatic Neck: no JVD, carotid bruits or masses Cardiac:  RRR; no significant murmurs, no rubs, or gallops Lungs:  CTA b/l, no wheezing, rhonchi or rales Abd: soft, nontender, obese MS: no deformity or atrophy Ext: trace posterior ankle edema only b/l Skin: warm and dry, no rash Neuro:  No gross deficits appreciated Psych: euthymic mood, full affect   EKG:  Done today and reviewed by myself shows  SR 67bpm, no significant change from prior, no ectopy   08/07/2018: ETT Blood pressure demonstrated a normal response to exercise. There was no ST segment deviation noted during stress. No T wave inversion was noted during stress.   08/07/2018: TTE Study Conclusions  - Left ventricle: The cavity size was normal. There was mild focal    basal hypertrophy of the septum. Systolic function was normal.    The estimated ejection fraction was in the range of 60% to 65%.    Wall motion was normal; there were no regional wall motion    abnormalities. Left ventricular diastolic  function parameters    were normal.  - Aortic valve: Trileaflet; normal thickness, mildly calcified    leaflets.  - Mitral valve: There was mild regurgitation.  - Pulmonic valve: There was trivial regurgitation.    Recent Labs: 07/25/2022: BUN 14; Creatinine, Ser 0.88; Hemoglobin 13.3; Platelets 364.0; Potassium 4.1; Sodium 136; TSH 4.81  No results found for requested labs within last 365 days.   CrCl cannot be calculated (Patient's most recent lab result is older than the maximum 21 days allowed.).   Wt Readings from Last 3 Encounters:  11/12/22 (!) 308 lb (139.7 kg)  11/01/22 (!) 307 lb 9.6 oz (139.5 kg)  10/05/22 289 lb (131.1 kg)     Other studies reviewed: Additional studies/records reviewed today include: summarized above  ASSESSMENT AND PLAN:  Palpitations PACs PVCs Resolved with management of  her thyroid disease  4. Younger brother died of ruptured aortic aneurysm Will send her for CT angio chest/abdomen to evaluate her aorta for screeing given this history  5. High BP Recheck by myself was 146/96 She reports she does check her BP periodically and is never this high She will take her spironolactone and continue to monitor Discussed the importance of good BP control She will let us know if her BP does not improve   We discussed need to transition to new EP MD, she reports her Mom mentioning Dr. Lovena Neighbours name, will plan for her to see him    Disposition: F/u with [place] otherwise again in a year, sooner if needed, pending CT and her home BPs  Current medicines are reviewed at length with the patient today.  The patient did not have any concerns regarding medicines.  Norma Fredrickson, PA-C 02/27/2023 11:44 AM     [facility] [address] [place] [address] [phone] (office)  [phone] (fax)

## 2023-02-27 NOTE — Patient Instructions (Addendum)
Medication Instructions:    Your physician recommends that you continue on your current medications as directed. Please refer to the Current Medication list given to you today.  *If you need a refill on your cardiac medications before your next appointment, please call your pharmacy*   Lab Work:  NONE ORDERED  TODAY   If you have labs (blood work) drawn today and your tests are completely normal, you will receive your results only by: MyChart Message (if you have MyChart) OR A paper copy in the mail If you have any lab test that is abnormal or we need to change your treatment, we will call you to review the results.   Testing/Procedures:  Non-Cardiac CT scanning, (CAT scanning), is a noninvasive, special x-ray that produces cross-sectional images of the body using x-rays and a computer. CT scans help physicians diagnose and treat medical conditions. For some CT exams, a contrast material is used to enhance visibility in the area of the body being studied. CT scans provide greater clarity and reveal more details than regular x-ray exams.    Follow-Up: At [facility], you and your health needs are our priority.  As part of our continuing mission to provide you with exceptional heart care, we have created designated Provider Care Teams.  These Care Teams include your primary Cardiologist (physician) and Advanced Practice Providers (APPs -  Physician Assistants and Nurse Practitioners) who all work together to provide you with the care you need, when you need it.  We recommend signing up for the patient portal called "MyChart".  Sign up information is provided on this After Visit Summary.  MyChart is used to connect with patients for Virtual Visits (Telemedicine).  Patients are able to view lab/test results, encounter notes, upcoming appointments, etc.  Non-urgent messages can be sent to your provider as well.   To learn more about what you can do with MyChart, go to  [email or web address].    Your next appointment:    1 year(s)  Provider:    Steffanie Dunn, MD     Other Instructions

## 2023-03-06 ENCOUNTER — Encounter: Payer: Self-pay | Admitting: Internal Medicine

## 2023-03-06 ENCOUNTER — Ambulatory Visit (INDEPENDENT_AMBULATORY_CARE_PROVIDER_SITE_OTHER): Payer: BC Managed Care – PPO | Admitting: Internal Medicine

## 2023-03-06 VITALS — BP 140/100 | HR 97 | Ht 65.5 in | Wt 314.8 lb

## 2023-03-06 DIAGNOSIS — E0590 Thyrotoxicosis, unspecified without thyrotoxic crisis or storm: Secondary | ICD-10-CM

## 2023-03-06 DIAGNOSIS — E0500 Thyrotoxicosis with diffuse goiter without thyrotoxic crisis or storm: Secondary | ICD-10-CM | POA: Diagnosis not present

## 2023-03-06 DIAGNOSIS — R635 Abnormal weight gain: Secondary | ICD-10-CM | POA: Diagnosis not present

## 2023-03-06 LAB — T4, FREE: Free T4: 0.91 ng/dL (ref 0.60–1.60)

## 2023-03-06 LAB — TSH: TSH: 2.51 u[IU]/mL (ref 0.35–5.50)

## 2023-03-06 NOTE — Progress Notes (Signed)
Name: Alicia Hoover  MRN/ DOB: [id number], 05-Feb-1976    Age/ Sex: 47 y.o., female     PCP: Etta Grandchild, MD   Reason for Endocrinology Evaluation: Hyperthyroidism     Initial Endocrinology Clinic Visit: 07/10/2019    PATIENT IDENTIFIER: Alicia Hoover is a 47 y.o., female with a past medical history of HTN, Obesity and Depression. She has followed with [facility] since 07/10/2019 for consultative assistance with management of her hyperthyroidism  HISTORICAL SUMMARY: The patient was first diagnosed with subclinical hyperthyroidism in 04/2019 with a TSh of 0.31 uIU/mL . She was started on Methimazole 04/2019 TRAb elevated at 14.60 IU/L  No Amiodarone  Was on Biotin until 05/2019   No radiation exposure.   Brother with thyroid disease     HYPOKALEMIA HISTORY: Pt has been noted with persistent and spontaneous hypokalemia since 08/2020 despite being on spironolactone.  Aldo was 10 mg/DL, renin was normal at 1.478 and normal Aldo/renin ratio-of note these were done while the patient on spironolactone Patient did not submit 24-hour urinary cortisol   SUBJECTIVE:    Today (03/06/2023):  Alicia Hoover is here for a follow up on hyperthyroidism secondary to Graves' disease.    Pt has been noted with weight gain , which is attributed to lack of Ozempic , has hip OA which limits excercise Denies diarrhea or constipation  Denies local neck symptoms  Denies eye symptoms  Denies palpitations  Denies tremors  Has noted LE edema ~ 3 weeks    Methimazole 5 mg daily     HISTORY:  Past Medical History:  Past Medical History:  Diagnosis Date   Asthma    GERD (gastroesophageal reflux disease)    Hypersomnia with sleep apnea    Hypertension    Migraine    Prediabetes    Rhinitis    Past Surgical History:  Past Surgical History:  Procedure Laterality Date   CHOLECYSTECTOMY     TOOTH EXTRACTION     Social History:  reports that she quit smoking  about 23 years ago. Her smoking use included cigarettes. She has a 0.10 pack-year smoking history. She has never used smokeless tobacco. She reports current alcohol use. She reports that she does not use drugs. Family History:  Family History  Problem Relation Age of Onset   Diabetes Mother    COPD Mother    Rheum arthritis Mother    Diabetes Father    Early death Father    Hyperlipidemia Brother    Thyroid disease Brother    Alcohol abuse Neg Hx    Cancer Neg Hx    Depression Neg Hx    Drug abuse Neg Hx    Hearing loss Neg Hx    Heart disease Neg Hx    Hypertension Neg Hx    Stroke Neg Hx      HOME MEDICATIONS: Allergies as of 03/06/2023       Reactions   Codeine Hives, Nausea And Vomiting   Tramadol Diarrhea, Nausea And Vomiting        Medication List        Accurate as of Mar 06, 2023  8:19 AM. If you have any questions, ask your nurse or doctor.          STOP taking these medications    benzonatate 100 MG capsule Commonly known as: Lawyer Stopped by: Scarlette Shorts, MD       TAKE these medications    acetaminophen 650  MG CR tablet Commonly known as: Tylenol 8 Hour Take 1 tablet (650 mg total) by mouth every 8 (eight) hours as needed for pain.   fluocinonide-emollient 0.05 % cream Commonly known as: LIDEX-E APPLY TO AFFECTED AREA TWICE A DAY   fluticasone 50 MCG/ACT nasal spray Commonly known as: FLONASE Place 2 sprays into the nose daily.   ibuprofen 800 MG tablet Commonly known as: ADVIL TAKE 1 TABLET BY MOUTH EVERY 8 HOURS AS NEEDED   levocetirizine 5 MG tablet Commonly known as: XYZAL TAKE 1 TABLET BY MOUTH EVERY DAY IN THE EVENING   methimazole 5 MG tablet Commonly known as: TAPAZOLE Take 1 tablet (5 mg total) by mouth daily.   montelukast 10 MG tablet Commonly known as: SINGULAIR TAKE 1 TABLET BY MOUTH EVERYDAY AT BEDTIME   multivitamin tablet Take 1 tablet by mouth daily.   norgestimate-ethinyl estradiol  0.25-35 MG-MCG tablet Commonly known as: ORTHO-CYCLEN TAKE 1 TABLET BY MOUTH EVERY DAY   ondansetron 4 MG disintegrating tablet Commonly known as: ZOFRAN-ODT Take 4 mg by mouth as needed for nausea or vomiting.   pantoprazole 40 MG tablet Commonly known as: PROTONIX TAKE 1 TABLET BY MOUTH TWICE A DAY   spironolactone 25 MG tablet Commonly known as: ALDACTONE TAKE 1 TABLET (25 MG TOTAL) BY MOUTH DAILY.   Trospium Chloride 60 MG Cp24 Take 1 capsule (60 mg total) by mouth daily.          OBJECTIVE:   PHYSICAL EXAM: VS: BP (!) 140/100   Pulse 97   Ht 5' 5.5" (1.664 m)   Wt (!) 314 lb 12.8 oz (142.8 kg)   SpO2 94%   BMI 51.59 kg/m    EXAM: General: Pt appears well and is in NAD  Eyes: External eye exam normal without stare, lid lag or exophthalmos.  EOM intact.    Neck: General: Supple without adenopathy. Thyroid: Thyroid size normal.  No goiter or nodules appreciated.  Lungs: Clear with good BS bilat   Heart: Auscultation: RRR.  Extremities: BL LE: Trace pretibial edema   Mental Status: Judgment, insight: Intact Orientation: Oriented to time, place, and person Mood and affect: No depression, anxiety, or agitation     DATA REVIEWED:   Latest Reference Range & Units 03/06/23 08:29  TSH 0.35 - 5.50 uIU/mL 2.51  T4,Free(Direct) 0.60 - 1.60 ng/dL 5.62    Latest Reference Range & Units 07/25/22 09:52  Sodium 135 - 145 mEq/L 136  Potassium 3.5 - 5.1 mEq/L 4.1  Chloride 96 - 112 mEq/L 102  CO2 19 - 32 mEq/L 27  Glucose 70 - 99 mg/dL 76  BUN 6 - 23 mg/dL 14  Creatinine 1.30 - 8.65 mg/dL 7.84  Calcium 8.4 - 69.6 mg/dL 8.9  GFR >29.52 mL/min 78.25     ASSESSMENT / PLAN / RECOMMENDATIONS:   Hyperthyroidism Secondary to Graves' Disease:   - Clinically she is euthyroid  - No local neck symptoms -She had opted to continue on Methimazole vs  alternative options of treatment such as radioactive iodine ablation versus surgery -TFTs are within normal range, but  we do have room to decrease methimazole as below  Medications   Decrease methimazole 5 mg  , 1 tablet 6 days a week     2. Graves' Disease:    - No extrathyroidal manifestations of Grave's disease.    3. Weight gain :  - She attributes this to lack of Ozempic as it is not covered by ins[employer]ance  - Discussed noom  as an option for diet, she had tried weight watchers in the past  - She is considering water excercise    4. HTN:  - Elevated today, she did not take spironolactone yet as she did not eat - Discussed importance of taking meds same time every day  - Discussed low salt diet   F/U in 6 months   Labs in 2 months     Signed electronically by: Lyndle Herrlich, MD  [facility]  [facility] [address] [place], [place] [address] Phone: [phone] FAX: [phone]      CC: Etta Grandchild, MD [address] [place] [address] Phone: [phone]  Fax: [phone]   Return to Endocrinology clinic as below: Future Appointments  Date Time Provider Department Center  03/26/2023  3:30 PM [facility]-CT 2 [facility]  05/17/2023  9:20 AM Marguerita Beards, MD [facility]

## 2023-03-07 LAB — T3: T3, Total: 156 ng/dL (ref 76–181)

## 2023-03-15 ENCOUNTER — Ambulatory Visit: Payer: BC Managed Care – PPO | Admitting: Physician Assistant

## 2023-03-18 ENCOUNTER — Ambulatory Visit: Payer: BC Managed Care – PPO | Admitting: Internal Medicine

## 2023-03-18 ENCOUNTER — Encounter: Payer: Self-pay | Admitting: Internal Medicine

## 2023-03-18 VITALS — BP 166/98 | HR 80 | Temp 98.2°F | Resp 16 | Ht 65.5 in | Wt 317.0 lb

## 2023-03-18 DIAGNOSIS — R7989 Other specified abnormal findings of blood chemistry: Secondary | ICD-10-CM | POA: Diagnosis not present

## 2023-03-18 DIAGNOSIS — Z6841 Body Mass Index (BMI) 40.0 and over, adult: Secondary | ICD-10-CM

## 2023-03-18 DIAGNOSIS — R600 Localized edema: Secondary | ICD-10-CM | POA: Diagnosis not present

## 2023-03-18 DIAGNOSIS — I10 Essential (primary) hypertension: Secondary | ICD-10-CM | POA: Diagnosis not present

## 2023-03-18 LAB — BASIC METABOLIC PANEL
BUN: 14 mg/dL (ref 6–23)
CO2: 28 mEq/L (ref 19–32)
Calcium: 8.8 mg/dL (ref 8.4–10.5)
Chloride: 102 mEq/L (ref 96–112)
Creatinine, Ser: 0.82 mg/dL (ref 0.40–1.20)
GFR: 84.79 mL/min (ref >60.00)
Glucose, Bld: 98 mg/dL (ref 70–99)
Potassium: 3.8 mEq/L (ref 3.5–5.1)
Sodium: 138 mEq/L (ref 135–145)

## 2023-03-18 LAB — CBC WITH DIFFERENTIAL/PLATELET
Basophils Absolute: 0 10*3/uL (ref 0.0–0.1)
Basophils Relative: 0.3 % (ref 0.0–3.0)
Eosinophils Absolute: 0.2 10*3/uL (ref 0.0–0.7)
Eosinophils Relative: 2 % (ref 0.0–5.0)
HCT: 40.1 % (ref 36.0–46.0)
Hemoglobin: 12.8 g/dL (ref 12.0–15.0)
Lymphocytes Relative: 36.1 % (ref 12.0–46.0)
Lymphs Abs: 3.7 10*3/uL (ref 0.7–4.0)
MCHC: 32 g/dL (ref 30.0–36.0)
MCV: 79.8 fl (ref 78.0–100.0)
Monocytes Absolute: 0.8 10*3/uL (ref 0.1–1.0)
Monocytes Relative: 8.2 % (ref 3.0–12.0)
Neutro Abs: 5.5 10*3/uL (ref 1.4–7.7)
Neutrophils Relative %: 53.4 % (ref 43.0–77.0)
Platelets: 376 10*3/uL (ref 150.0–400.0)
RBC: 5.03 Mil/uL (ref 3.87–5.11)
RDW: 14.6 % (ref 11.5–15.5)
WBC: 10.2 10*3/uL (ref 4.0–10.5)

## 2023-03-18 LAB — HEPATIC FUNCTION PANEL
ALT: 26 U/L (ref 0–35)
AST: 23 U/L (ref 0–37)
Albumin: 4 g/dL (ref 3.5–5.2)
Alkaline Phosphatase: 64 U/L (ref 39–117)
Bilirubin, Direct: 0.1 mg/dL (ref 0.0–0.3)
Total Bilirubin: 0.2 mg/dL (ref 0.2–1.2)
Total Protein: 7 g/dL (ref 6.0–8.3)

## 2023-03-18 LAB — D-DIMER, QUANTITATIVE: D-Dimer, Quant: 0.73 mcg/mL FEU — ABNORMAL HIGH (ref <0.50)

## 2023-03-18 LAB — TROPONIN I (HIGH SENSITIVITY): High Sens Troponin I: 4 ng/L (ref 2–17)

## 2023-03-18 LAB — BRAIN NATRIURETIC PEPTIDE: Pro B Natriuretic peptide (BNP): 45 pg/mL (ref 0.0–100.0)

## 2023-03-18 MED ORDER — ZEPBOUND 2.5 MG/0.5ML ~~LOC~~ SOAJ: 2.5000 mg | SUBCUTANEOUS | 0 refills | Status: DC

## 2023-03-18 NOTE — Progress Notes (Unsigned)
Subjective:  Patient ID: Alicia Hoover, female    DOB: [birth year]  Age: 47 y.o. MRN: [id number]  CC: Hypertension   HPI Alicia Hoover presents for f/up ----  She complains of a one month history of BLE edema and right calf pain. She recently saw cardiology and says her EKG was okay.  Outpatient Medications Prior to Visit  Medication Sig Dispense Refill   acetaminophen (TYLENOL 8 HOUR) 650 MG CR tablet Take 1 tablet (650 mg total) by mouth every 8 (eight) hours as needed for pain. 15 tablet 0   fluocinonide-emollient (LIDEX-E) 0.05 % cream APPLY TO AFFECTED AREA TWICE A DAY 60 g 1   fluticasone (FLONASE) 50 MCG/ACT nasal spray Place 2 sprays into the nose daily.     ibuprofen (ADVIL) 800 MG tablet TAKE 1 TABLET BY MOUTH EVERY 8 HOURS AS NEEDED 30 tablet 5   levocetirizine (XYZAL) 5 MG tablet TAKE 1 TABLET BY MOUTH EVERY DAY IN THE EVENING 90 tablet 1   methimazole (TAPAZOLE) 5 MG tablet Take 1 tablet (5 mg total) by mouth daily. 90 tablet 3   montelukast (SINGULAIR) 10 MG tablet TAKE 1 TABLET BY MOUTH EVERYDAY AT BEDTIME 90 tablet 1   Multiple Vitamin (MULTIVITAMIN) tablet Take 1 tablet by mouth daily.     norgestimate-ethinyl estradiol (ORTHO-CYCLEN) 0.25-35 MG-MCG tablet TAKE 1 TABLET BY MOUTH EVERY DAY 84 tablet 4   ondansetron (ZOFRAN-ODT) 4 MG disintegrating tablet Take 4 mg by mouth as needed for nausea or vomiting.     pantoprazole (PROTONIX) 40 MG tablet TAKE 1 TABLET BY MOUTH TWICE A DAY 180 tablet 1   Trospium Chloride 60 MG CP24 Take 1 capsule (60 mg total) by mouth daily. 30 capsule 11   spironolactone (ALDACTONE) 25 MG tablet TAKE 1 TABLET (25 MG TOTAL) BY MOUTH DAILY. 90 tablet 0   No facility-administered medications prior to visit.    ROS Review of Systems  Constitutional:  Positive for unexpected weight change (wt gain). Negative for diaphoresis and fatigue.  HENT: Negative.    Eyes: Negative.  Negative for visual disturbance.  Respiratory:  Negative for  chest tightness, shortness of breath and wheezing.   Cardiovascular:  Positive for leg swelling. Negative for chest pain and palpitations.  Gastrointestinal:  Negative for abdominal pain, constipation, diarrhea, nausea and vomiting.  Endocrine: Negative.   Genitourinary: Negative.  Negative for difficulty urinating and dysuria.  Musculoskeletal:  Positive for arthralgias. Negative for back pain, joint swelling, myalgias and neck pain.  Skin: Negative.   Neurological:  Negative for dizziness, weakness, light-headedness and headaches.  Hematological:  Negative for adenopathy. Does not bruise/bleed easily.  Psychiatric/Behavioral: Negative.      Objective:  BP (!) 166/98 (BP Location: Right Arm, Patient Position: Sitting, Cuff Size: Large)   Pulse 80   Temp 98.2 F (36.8 C) (Oral)   Resp 16   Ht 5' 5.5" (1.664 m)   Wt (!) 317 lb (143.8 kg)   LMP 03/17/2023 (Exact Date)   SpO2 97%   BMI 51.95 kg/m   BP Readings from Last 3 Encounters:  03/18/23 (!) 166/98  03/06/23 (!) 140/100  02/27/23 (!) 156/101    Wt Readings from Last 3 Encounters:  03/18/23 (!) 317 lb (143.8 kg)  03/06/23 (!) 314 lb 12.8 oz (142.8 kg)  02/27/23 (!) 315 lb (142.9 kg)    Physical Exam Vitals reviewed.  Constitutional:      Appearance: She is obese.  HENT:     Mouth/Throat:  Mouth: Mucous membranes are moist.  Eyes:     General: No scleral icterus.    Conjunctiva/sclera: Conjunctivae normal.  Cardiovascular:     Rate and Rhythm: Normal rate and regular rhythm.     Heart sounds: No murmur heard.    No friction rub. No gallop.  Pulmonary:     Effort: Pulmonary effort is normal.     Breath sounds: No stridor. No wheezing, rhonchi or rales.  Abdominal:     General: Abdomen is protuberant. Bowel sounds are normal. There is no distension.     Palpations: Abdomen is soft. There is no hepatomegaly, splenomegaly or mass.     Tenderness: There is no abdominal tenderness. There is no guarding.   Musculoskeletal:        General: Normal range of motion.     Cervical back: Neck supple.     Right lower leg: 1+ Edema present.     Left lower leg: 1+ Edema present.  Lymphadenopathy:     Cervical: No cervical adenopathy.  Skin:    General: Skin is warm and dry.  Neurological:     General: No focal deficit present.     Mental Status: She is alert. Mental status is at baseline.  Psychiatric:        Mood and Affect: Mood normal.        Behavior: Behavior normal.     Lab Results  Component Value Date   WBC 10.2 03/18/2023   HGB 12.8 03/18/2023   HCT 40.1 03/18/2023   PLT 376.0 03/18/2023   GLUCOSE 98 03/18/2023   CHOL 138 10/05/2021   TRIG 140.0 10/05/2021   HDL 35.80 (L) 10/05/2021   LDLDIRECT 57.0 03/27/2017   LDLCALC 74 10/05/2021   ALT 26 03/18/2023   AST 23 03/18/2023   NA 138 03/18/2023   K 3.8 03/18/2023   CL 102 03/18/2023   CREATININE 0.82 03/18/2023   BUN 14 03/18/2023   CO2 28 03/18/2023   TSH 2.51 03/06/2023   HGBA1C 5.9 07/25/2022    No results found.  Assessment & Plan:   Bilateral leg edema -     Brain natriuretic peptide; Future -     Basic metabolic panel; Future -     Urinalysis, Routine w reflex microscopic; Future -     Troponin I (High Sensitivity); Future -     D-dimer, quantitative; Future -     Hepatic function panel; Future -     VAS [place] LOWER EXTREMITY VENOUS (DVT); Future  Essential hypertension, benign- She has not achieved her BP goal. Will start dyazide.  -     CBC with Differential/Platelet; Future -     Basic metabolic panel; Future -     Urinalysis, Routine w reflex microscopic; Future -     Hepatic function panel; Future -     Triamterene-HCTZ; Take 1 each (1 capsule total) by mouth daily.  Dispense: 90 capsule; Refill: 0  Morbid obesity (HCC) -     Hepatic function panel; Future -     Zepbound; Inject 2.5 mg into the skin once a week.  Dispense: 4 mL; Refill: 0  D-dimer, elevated- Will evaluate for DVT. -     VAS [place]  LOWER EXTREMITY VENOUS (DVT); Future     Follow-up: Return in about 3 months (around 06/18/2023).  Sanda Linger, MD

## 2023-03-18 NOTE — Patient Instructions (Signed)
Peripheral Edema  Peripheral edema is swelling that is caused by a buildup of fluid. Peripheral edema most often affects the lower legs, ankles, and feet. It can also develop in the arms, hands, and face. The area of the body that has peripheral edema will look swollen. It may also feel heavy or warm. Your clothes may start to feel tight. Pressing on the area may make a temporary dent in your skin (pitting edema). You may not be able to move your swollen arm or leg as much as usual. There are many causes of peripheral edema. It can happen because of a complication of other conditions such as heart failure, kidney disease, or a problem with your circulation. It also can be a side effect of certain medicines or happen because of an infection. It often happens to women during pregnancy. Sometimes, the cause is not known. Follow these instructions at home: Managing pain, stiffness, and swelling  Raise (elevate) your legs while you are sitting or lying down. Move around often to prevent stiffness and to reduce swelling. Do not sit or stand for long periods of time. Do not wear tight clothing. Do not wear garters on your upper legs. Exercise your legs to get your circulation going. This helps to move the fluid back into your blood vessels, and it may help the swelling go down. Wear compression stockings as told by your health care provider. These stockings help to prevent blood clots and reduce swelling in your legs. It is important that these are the correct size. These stockings should be prescribed by your doctor to prevent possible injuries. If elastic bandages or wraps are recommended, use them as told by your health care provider. Medicines Take over-the-counter and prescription medicines only as told by your health care provider. Your health care provider may prescribe medicine to help your body get rid of excess water (diuretic). Take this medicine if you are told to take it. General  instructions Eat a low-salt (low-sodium) diet as told by your health care provider. Sometimes, eating less salt may reduce swelling. Pay attention to any changes in your symptoms. Moisturize your skin daily to help prevent skin from cracking and draining. Keep all follow-up visits. This is important. Contact a health care provider if: You have a fever. You have swelling in only one leg. You have increased swelling, redness, or pain in one or both of your legs. You have drainage or sores at the area where you have edema. Get help right away if: You have edema that starts suddenly or is getting worse, especially if you are pregnant or have a medical condition. You develop shortness of breath, especially when you are lying down. You have pain in your chest or abdomen. You feel weak. You feel like you will faint. These symptoms may be an emergency. Get help right away. Call 911. Do not wait to see if the symptoms will go away. Do not drive yourself to the hospital. Summary Peripheral edema is swelling that is caused by a buildup of fluid. Peripheral edema most often affects the lower legs, ankles, and feet. Move around often to prevent stiffness and to reduce swelling. Do not sit or stand for long periods of time. Pay attention to any changes in your symptoms. Contact a health care provider if you have edema that starts suddenly or is getting worse, especially if you are pregnant or have a medical condition. Get help right away if you develop shortness of breath, especially when lying down.   This information is not intended to replace advice given to you by your health care provider. Make sure you discuss any questions you have with your health care provider. Document Revised: 05/29/2021 Document Reviewed: 05/29/2021 Elsevier Patient Education  2024 [employer] Inc[employer]  

## 2023-03-19 DIAGNOSIS — R7989 Other specified abnormal findings of blood chemistry: Secondary | ICD-10-CM | POA: Insufficient documentation

## 2023-03-19 LAB — URINALYSIS, ROUTINE W REFLEX MICROSCOPIC
Bilirubin Urine: NEGATIVE
Ketones, ur: NEGATIVE
Leukocytes,Ua: NEGATIVE
Nitrite: NEGATIVE
Specific Gravity, Urine: 1.025 (ref 1.000–1.030)
Total Protein, Urine: NEGATIVE
Urine Glucose: NEGATIVE
Urobilinogen, UA: 0.2 (ref 0.0–1.0)
pH: 6 (ref 5.0–8.0)

## 2023-03-19 MED ORDER — TRIAMTERENE-HCTZ 37.5-25 MG PO CAPS
1.0000 | ORAL_CAPSULE | Freq: Every day | ORAL | 0 refills | Status: DC
Start: 2023-03-19 — End: 2023-06-10

## 2023-03-21 ENCOUNTER — Encounter: Payer: Self-pay | Admitting: Internal Medicine

## 2023-03-25 ENCOUNTER — Ambulatory Visit (HOSPITAL_COMMUNITY)
Admission: RE | Admit: 2023-03-25 | Discharge: 2023-03-25 | Disposition: A | Discharge status: Home / Self Care | Payer: BC Managed Care – PPO | Source: Ambulatory Visit | Attending: Internal Medicine | Admitting: Internal Medicine

## 2023-03-25 DIAGNOSIS — R7989 Other specified abnormal findings of blood chemistry: Secondary | ICD-10-CM | POA: Insufficient documentation

## 2023-03-25 DIAGNOSIS — R600 Localized edema: Secondary | ICD-10-CM | POA: Diagnosis present

## 2023-03-25 DIAGNOSIS — Z8249 Family history of ischemic heart disease and other diseases of the circulatory system: Secondary | ICD-10-CM | POA: Insufficient documentation

## 2023-03-25 DIAGNOSIS — I700 Atherosclerosis of aorta: Secondary | ICD-10-CM | POA: Insufficient documentation

## 2023-03-25 DIAGNOSIS — Z9049 Acquired absence of other specified parts of digestive tract: Secondary | ICD-10-CM | POA: Insufficient documentation

## 2023-03-26 ENCOUNTER — Ambulatory Visit (HOSPITAL_COMMUNITY)
Admission: RE | Admit: 2023-03-26 | Discharge: 2023-03-26 | Disposition: A | Discharge status: Home / Self Care | Payer: BC Managed Care – PPO | Source: Ambulatory Visit | Attending: Physician Assistant | Admitting: Physician Assistant

## 2023-03-26 DIAGNOSIS — Z8249 Family history of ischemic heart disease and other diseases of the circulatory system: Secondary | ICD-10-CM | POA: Diagnosis not present

## 2023-03-26 DIAGNOSIS — R600 Localized edema: Secondary | ICD-10-CM | POA: Diagnosis not present

## 2023-03-26 MED ORDER — IOHEXOL 350 MG/ML SOLN
100.0000 mL | Freq: Once | INTRAVENOUS | Status: AC | PRN
  Administered 2023-03-26: 100 mL via INTRAVENOUS

## 2023-03-27 ENCOUNTER — Encounter: Payer: Self-pay | Admitting: Internal Medicine

## 2023-03-28 ENCOUNTER — Other Ambulatory Visit: Payer: Self-pay | Admitting: Internal Medicine

## 2023-03-28 DIAGNOSIS — E041 Nontoxic single thyroid nodule: Secondary | ICD-10-CM

## 2023-03-29 ENCOUNTER — Other Ambulatory Visit: Payer: Self-pay | Admitting: Physician Assistant

## 2023-03-29 DIAGNOSIS — I493 Ventricular premature depolarization: Secondary | ICD-10-CM

## 2023-03-29 DIAGNOSIS — Z8249 Family history of ischemic heart disease and other diseases of the circulatory system: Secondary | ICD-10-CM

## 2023-03-29 DIAGNOSIS — I10 Essential (primary) hypertension: Secondary | ICD-10-CM

## 2023-03-29 DIAGNOSIS — I491 Atrial premature depolarization: Secondary | ICD-10-CM

## 2023-04-02 ENCOUNTER — Ambulatory Visit
Admission: RE | Admit: 2023-04-02 | Discharge: 2023-04-02 | Disposition: A | Discharge status: Home / Self Care | Payer: BC Managed Care – PPO | Source: Ambulatory Visit | Attending: Internal Medicine | Admitting: Internal Medicine

## 2023-04-02 ENCOUNTER — Ambulatory Visit: Payer: BC Managed Care – PPO | Admitting: Internal Medicine

## 2023-04-02 ENCOUNTER — Other Ambulatory Visit: Payer: BC Managed Care – PPO

## 2023-04-02 DIAGNOSIS — E041 Nontoxic single thyroid nodule: Secondary | ICD-10-CM

## 2023-04-03 ENCOUNTER — Telehealth: Payer: Self-pay | Admitting: Internal Medicine

## 2023-04-03 DIAGNOSIS — E041 Nontoxic single thyroid nodule: Secondary | ICD-10-CM

## 2023-04-03 NOTE — Telephone Encounter (Signed)
-----   Message from Cristina Gherghe, MD sent at 04/02/2023  1:26 PM EDT ----- Abby, I had to order a thyroid ultrasound for this patient when you were gone after a CT scan showed a thyroid nodule.  I am sending you the report, as it is returned to me. Cristina 

## 2023-04-03 NOTE — Telephone Encounter (Signed)
Discussed thyroid ultrasound results with the patient on 04/03/2023 at 1500   Patient meets FNA criteria of the left thyroid nodule   Patient in agreement, an order has been placed

## 2023-04-03 NOTE — Telephone Encounter (Signed)
-----   Message from Carlus Pavlov, MD sent at 04/02/2023  1:26 PM EDT ----- Abby, I had to order a thyroid ultrasound for this patient when you were gone after a CT scan showed a thyroid nodule.  I am sending you the report, as it is returned to me. Silvestre Mesi

## 2023-04-08 ENCOUNTER — Ambulatory Visit: Payer: BC Managed Care – PPO | Admitting: Obstetrics

## 2023-05-15 ENCOUNTER — Inpatient Hospital Stay: Admission: RE | Admit: 2023-05-15 | Payer: BC Managed Care – PPO | Source: Ambulatory Visit

## 2023-05-17 ENCOUNTER — Ambulatory Visit: Payer: BC Managed Care – PPO | Admitting: Obstetrics and Gynecology

## 2023-05-17 NOTE — Progress Notes (Deleted)
[facility] Return Visit  SUBJECTIVE  History of Present Illness: CASANDRA ROBERS is a 47 y.o. female seen in follow-up for OAB, SUI and constipation. Plan at last visit was to start trospium 60mg  ER daily. Referral was placed to pelvic PT. Also recommended fiber supplementation.   Now is able to get to the bathroom before she leaks, or leaks only a very small amount. Only wakes up 1-2 times per night. Denies dry mouth, dry eyes. Has not started to use her CPAP at night (worried about hearing her mom at night who is staying with her).  Starts the physical therapy in a few weeks.   Started taking fiber again and so the constipation has improved.   Past Medical History: Patient  has a past medical history of Asthma, GERD (gastroesophageal reflux disease), Hypersomnia with sleep apnea, Hypertension, Migraine, Prediabetes, and Rhinitis.   Past Surgical History: She  has a past surgical history that includes Cholecystectomy and Tooth extraction.   Medications: She has a current medication list which includes the following prescription(s): acetaminophen, fluocinonide-emollient, fluticasone, ibuprofen, levocetirizine, methimazole, montelukast, multivitamin, norgestimate-ethinyl estradiol, ondansetron, pantoprazole, zepbound, triamterene-hydrochlorothiazide, trospium chloride, and [DISCONTINUED] dexlansoprazole.   Allergies: Patient is allergic to codeine and tramadol.   Social History: Patient  reports that she quit smoking about 23 years ago. Her smoking use included cigarettes. She started smoking about 24 years ago. She has a 0.1 pack-year smoking history. She has never used smokeless tobacco. She reports current alcohol use. She reports that she does not use drugs.      OBJECTIVE     Physical Exam: There were no vitals filed for this visit.  Gen: No apparent distress, A&O x 3.  Detailed Urogynecologic Evaluation:  Deferred. Prior exam showed:  POP-Q:    POP-Q    -3                                            Aa   -3                                           Ba   -5.5                                              C    3                                            Gh   5                                            Pb   8                                            tvl    -2  Ap   -2                                            Bp   -7                                              D        ASSESSMENT AND PLAN    Ms. Laslie is a 47 y.o. with:  No diagnosis found.  - Continue with Trospium 60mg  ER - Will be attending pelvic physical therapy  - Continue with fiber supplementation  Return 1 year or sooner if needed  Marguerita Beards, MD  Time spent: I spent 20 minutes dedicated to the care of this patient on the date of this encounter to include pre-visit review of records, face-to-face time with the patient and post visit documentation.

## 2023-05-23 ENCOUNTER — Other Ambulatory Visit: Payer: Self-pay | Admitting: Obstetrics and Gynecology

## 2023-05-23 ENCOUNTER — Other Ambulatory Visit: Payer: Self-pay | Admitting: Internal Medicine

## 2023-05-23 DIAGNOSIS — E876 Hypokalemia: Secondary | ICD-10-CM

## 2023-05-23 DIAGNOSIS — N3281 Overactive bladder: Secondary | ICD-10-CM

## 2023-05-23 DIAGNOSIS — I10 Essential (primary) hypertension: Secondary | ICD-10-CM

## 2023-05-27 ENCOUNTER — Ambulatory Visit: Payer: BC Managed Care – PPO | Admitting: Obstetrics and Gynecology

## 2023-05-27 ENCOUNTER — Encounter: Payer: Self-pay | Admitting: Obstetrics and Gynecology

## 2023-05-27 ENCOUNTER — Other Ambulatory Visit (HOSPITAL_COMMUNITY)
Admission: RE | Admit: 2023-05-27 | Discharge: 2023-05-27 | Disposition: A | Discharge status: Home / Self Care | Payer: BC Managed Care – PPO | Source: Ambulatory Visit | Attending: Obstetrics | Admitting: Obstetrics

## 2023-05-27 VITALS — BP 135/81 | HR 77 | Ht 65.5 in | Wt 319.0 lb

## 2023-05-27 DIAGNOSIS — Z01419 Encounter for gynecological examination (general) (routine) without abnormal findings: Secondary | ICD-10-CM | POA: Insufficient documentation

## 2023-05-27 DIAGNOSIS — Z1339 Encounter for screening examination for other mental health and behavioral disorders: Secondary | ICD-10-CM

## 2023-05-27 DIAGNOSIS — Z3041 Encounter for surveillance of contraceptive pills: Secondary | ICD-10-CM | POA: Diagnosis not present

## 2023-05-27 MED ORDER — NYSTATIN-TRIAMCINOLONE 100000-0.1 UNIT/GM-% EX OINT: 1.0000 | TOPICAL_OINTMENT | Freq: Two times a day (BID) | CUTANEOUS | 0 refills | Status: DC

## 2023-05-27 MED ORDER — NORGESTIMATE-ETH ESTRADIOL 0.25-35 MG-MCG PO TABS
1.0000 | ORAL_TABLET | Freq: Every day | ORAL | 4 refills | Status: DC
Start: 2023-05-27 — End: 2024-06-12

## 2023-05-27 NOTE — Progress Notes (Signed)
Subjective:     Alicia Hoover is a 47 y.o. female P3 with BMI 8 who is here for a comprehensive physical exam. The patient reports vulva pruritus for the past few weeks. She suspects a yeast infection. She is sexually active using birth control pills. She is interested in bilateral salpingectomy. Patient denies pelvic pain or abnormal discharge. She reports a monthly 3-day period. She is without any other complaints  Past Medical History:  Diagnosis Date   Asthma    GERD (gastroesophageal reflux disease)    Hypersomnia with sleep apnea    Hypertension    Migraine    Prediabetes    Rhinitis    Past Surgical History:  Procedure Laterality Date   CHOLECYSTECTOMY     TOOTH EXTRACTION     Family History  Problem Relation Age of Onset   Diabetes Mother    COPD Mother    Rheum arthritis Mother    Diabetes Father    Early death Father    Hyperlipidemia Brother    Thyroid disease Brother    Alcohol abuse Neg Hx    Cancer Neg Hx    Depression Neg Hx    Drug abuse Neg Hx    Hearing loss Neg Hx    Heart disease Neg Hx    Hypertension Neg Hx    Stroke Neg Hx      Social History   Socioeconomic History   Marital status: Widowed    Spouse name: Not on file   Number of children: 3   Years of education: Not on file   Highest education level: Not on file  Occupational History   Occupation: [employer]    Employer: [employer]  Tobacco Use   Smoking status: Former    Current packs/day: 0.00    Average packs/day: 0.1 packs/day for 1 year (0.1 ttl pk-yrs)    Types: Cigarettes    Start date: 10/08/1998    Quit date: 10/09/1999    Years since quitting: 23.6   Smokeless tobacco: Never  Vaping Use   Vaping status: Never Used  Substance and Sexual Activity   Alcohol use: Yes    Comment: 1 glass of white wine monthly   Drug use: No   Sexual activity: Yes    Birth control/protection: Pill, OCP  Other Topics Concern   Not on file  Social History Narrative   Not on file    Social Determinants of Health   Financial Resource Strain: Not on file  Food Insecurity: Not on file  Transportation Needs: Not on file  Physical Activity: Not on file  Stress: Not on file  Social Connections: Not on file  Intimate Partner Violence: Not on file   Health Maintenance  Topic Date Due   Colonoscopy  Never done   MAMMOGRAM  05/05/2022   COVID-19 Vaccine (3 - 2023-24 season) 06/08/2022   INFLUENZA VACCINE  05/09/2023   PAP SMEAR-Modifier  11/01/2024   DTaP/Tdap/Td (3 - Td or Tdap) 04/07/2029   Hepatitis C Screening  Completed   HIV Screening  Completed   HPV VACCINES  Aged Out       Review of Systems Pertinent items noted in HPI and remainder of comprehensive ROS otherwise negative.   Objective:  Blood pressure 135/81, pulse 77, height 5' 5.5" (1.664 m), weight (!) 319 lb (144.7 kg).   GENERAL: Well-developed, well-nourished female in no acute distress.  HEENT: Normocephalic, atraumatic. Sclerae anicteric.  NECK: Supple. Normal thyroid.  LUNGS: Clear to  auscultation bilaterally.  HEART: Regular rate and rhythm. BREASTS: Symmetric in size. No palpable masses or lymphadenopathy, skin changes, or nipple drainage. ABDOMEN: Soft, nontender, nondistended. No organomegaly. PELVIC: Normal external female genitalia. Vagina is pink and rugated.  Normal discharge. Normal appearing cervix. Uterus is normal in size. No adnexal mass or tenderness. Chaperone present during the pelvic exam EXTREMITIES: No cyanosis, clubbing, or edema, 2+ distal pulses.     Assessment:    Healthy female exam.      Plan:    Pap smear deferred as it was normal 10/2021 Screening mammogram ordered Patient had negative colaguard 10/23/2021 Vaginal swab collected Rx mycolog provided Patient interested in BTL. Other reversible forms of contraception were discussed with patient; she declines all other modalities.  Risks of procedure discussed with patient including permanence of method, risk  of regret, bleeding, infection, injury to surrounding organs and need for additional procedures including laparotomy.  Failure risk less than 0.5% with increased risk of ectopic gestation if pregnancy occurs was also discussed with patient.   Patient would like to proceed with bilateral salpingectomy but is considering Mirena IUD     See After Visit Summary for Counseling Recommendations

## 2023-05-27 NOTE — Progress Notes (Signed)
47 y.o. GYN presents for AEX

## 2023-05-28 LAB — CERVICOVAGINAL ANCILLARY ONLY
Chlamydia: NEGATIVE
Comment: NEGATIVE
Comment: NORMAL
Neisseria Gonorrhea: NEGATIVE

## 2023-06-10 ENCOUNTER — Other Ambulatory Visit: Payer: Self-pay | Admitting: Internal Medicine

## 2023-06-10 DIAGNOSIS — I10 Essential (primary) hypertension: Secondary | ICD-10-CM

## 2023-07-08 ENCOUNTER — Telehealth: Payer: Self-pay | Admitting: Internal Medicine

## 2023-07-08 NOTE — Telephone Encounter (Signed)
Caller & What Company:  Corrie Dandy from [employer]   Phone Number:  [phone]   Needs Verbal orders for what service & frequency:  Prescription with numerical pressure settings for a cpap device.  Also need clinical notes that mention use and benefit from their current machine.

## 2023-07-08 NOTE — Telephone Encounter (Signed)
She will have to see a sleep med doctor

## 2023-07-18 ENCOUNTER — Ambulatory Visit: Payer: BC Managed Care – PPO | Admitting: Obstetrics and Gynecology

## 2023-07-18 ENCOUNTER — Encounter: Payer: Self-pay | Admitting: Obstetrics and Gynecology

## 2023-07-18 VITALS — BP 142/70 | HR 71

## 2023-07-18 DIAGNOSIS — N393 Stress incontinence (female) (male): Secondary | ICD-10-CM | POA: Diagnosis not present

## 2023-07-18 DIAGNOSIS — N3281 Overactive bladder: Secondary | ICD-10-CM | POA: Diagnosis not present

## 2023-07-18 MED ORDER — TROSPIUM CHLORIDE ER 60 MG PO CP24
1.0000 | ORAL_CAPSULE | Freq: Every day | ORAL | 11 refills | Status: AC
Start: 2023-07-18 — End: ?

## 2023-07-18 NOTE — Progress Notes (Signed)
[facility] Return Visit  SUBJECTIVE  History of Present Illness: Alicia Hoover is a 47 y.o. female seen in follow-up for mixed incontinence and constipation. She is on Trospium 60mg  ER daily. She did not attend pelvic physical therapy because she was taking care of her sick mother who has since passed. Has not had the medication for about a month. Has noticed a big difference since stopping, has less control.   She is waking up 2-3 times per night to use the bathroom. During the day, she is urinating 3-4 times but does have some urgency.  Past Medical History: Patient  has a past medical history of Asthma, GERD (gastroesophageal reflux disease), Hypersomnia with sleep apnea, Hypertension, Migraine, Prediabetes, and Rhinitis.   Past Surgical History: She  has a past surgical history that includes Cholecystectomy and Tooth extraction.   Medications: She has a current medication list which includes the following prescription(s): acetaminophen, fluocinonide-emollient, fluticasone, ibuprofen, levocetirizine, methimazole, montelukast, multivitamin, norgestimate-ethinyl estradiol, nystatin-triamcinolone ointment, ondansetron, pantoprazole, triamterene-hydrochlorothiazide, zepbound, trospium chloride, and [DISCONTINUED] dexlansoprazole.   Allergies: Patient is allergic to codeine and tramadol.   Social History: Patient  reports that she quit smoking about 23 years ago. Her smoking use included cigarettes. She started smoking about 24 years ago. She has a 0.1 pack-year smoking history. She has never used smokeless tobacco. She reports current alcohol use. She reports that she does not use drugs.      OBJECTIVE     Physical Exam: Vitals:   07/18/23 0854  BP: (!) 150/76  Pulse: 79   Gen: No apparent distress, A&O x 3.  Detailed Urogynecologic Evaluation:  Deferred.    ASSESSMENT AND PLAN    Alicia Hoover is a 47 y.o. with:  1. SUI (stress urinary incontinence, female)    2. Overactive bladder    - For OAB, will refill Trospium 60mg  ER daily. - Wants to revisit pelvic PT for SUI symptoms. Also discussed procedural options if desired.  Return 1 year or sooner if needed  Marguerita Beards, MD

## 2023-08-16 ENCOUNTER — Telehealth: Payer: Self-pay

## 2023-08-16 NOTE — Telephone Encounter (Signed)
Called patient to advise that Dr. Jolayne Panther is only in the OR on 09/24/23 and I will call her once the January schedule is available. Voicemail was full.

## 2023-08-16 NOTE — Telephone Encounter (Signed)
-----   Message from [facility] sent at 05/27/2023  3:09 PM EDT ----- Please schedule bilateral salpingectomy on or after 09/27/2023 per patient request. Patient has blue cross blue shield  Thanks  Gigi Gin

## 2023-09-10 ENCOUNTER — Ambulatory Visit (INDEPENDENT_AMBULATORY_CARE_PROVIDER_SITE_OTHER): Payer: BC Managed Care – PPO | Admitting: Internal Medicine

## 2023-09-10 DIAGNOSIS — E0500 Thyrotoxicosis with diffuse goiter without thyrotoxic crisis or storm: Secondary | ICD-10-CM | POA: Diagnosis not present

## 2023-09-10 DIAGNOSIS — Z1231 Encounter for screening mammogram for malignant neoplasm of breast: Secondary | ICD-10-CM

## 2023-09-10 DIAGNOSIS — I10 Essential (primary) hypertension: Secondary | ICD-10-CM

## 2023-09-10 LAB — TSH: TSH: 1.5 u[IU]/mL (ref 0.35–5.50)

## 2023-09-10 LAB — BASIC METABOLIC PANEL
BUN: 16 mg/dL (ref 6–23)
CO2: 32 meq/L (ref 19–32)
Calcium: 9.4 mg/dL (ref 8.4–10.5)
Chloride: 100 meq/L (ref 96–112)
Creatinine, Ser: 0.91 mg/dL (ref 0.40–1.20)
GFR: 74.57 mL/min (ref >60.00)
Glucose, Bld: 92 mg/dL (ref 70–99)
Potassium: 3.8 meq/L (ref 3.5–5.1)
Sodium: 137 meq/L (ref 135–145)

## 2023-09-10 LAB — CBC WITH DIFFERENTIAL/PLATELET
Basophils Absolute: 0 10*3/uL (ref 0.0–0.1)
Basophils Relative: 0.4 % (ref 0.0–3.0)
Eosinophils Absolute: 0.2 10*3/uL (ref 0.0–0.7)
Eosinophils Relative: 1.8 % (ref 0.0–5.0)
HCT: 41.5 % (ref 36.0–46.0)
Hemoglobin: 13.4 g/dL (ref 12.0–15.0)
Lymphocytes Relative: 26.9 % (ref 12.0–46.0)
Lymphs Abs: 2.8 10*3/uL (ref 0.7–4.0)
MCHC: 32.3 g/dL (ref 30.0–36.0)
MCV: 78.8 fL (ref 78.0–100.0)
Monocytes Absolute: 0.9 10*3/uL (ref 0.1–1.0)
Monocytes Relative: 8.1 % (ref 3.0–12.0)
Neutro Abs: 6.6 10*3/uL (ref 1.4–7.7)
Neutrophils Relative %: 62.8 % (ref 43.0–77.0)
Platelets: 391 10*3/uL (ref 150.0–400.0)
RBC: 5.27 Mil/uL — ABNORMAL HIGH (ref 3.87–5.11)
RDW: 14.8 % (ref 11.5–15.5)
WBC: 10.5 10*3/uL (ref 4.0–10.5)

## 2023-09-10 MED ORDER — SEMAGLUTIDE-WEIGHT MANAGEMENT 2.4 MG/0.75ML ~~LOC~~ SOAJ
2.4000 mg | SUBCUTANEOUS | 0 refills | Status: DC
Start: 2024-01-04 — End: 2023-09-11

## 2023-09-10 MED ORDER — SEMAGLUTIDE-WEIGHT MANAGEMENT 0.25 MG/0.5ML ~~LOC~~ SOAJ
0.2500 mg | SUBCUTANEOUS | 0 refills | Status: DC
Start: 2023-09-10 — End: 2023-10-08

## 2023-09-10 MED ORDER — INSULIN PEN NEEDLE 32G X 6 MM MISC
1.0000 | 1 refills | Status: DC
Start: 2023-09-10 — End: 2024-04-22

## 2023-09-10 MED ORDER — SEMAGLUTIDE-WEIGHT MANAGEMENT 1 MG/0.5ML ~~LOC~~ SOAJ
1.0000 mg | SUBCUTANEOUS | 0 refills | Status: DC
Start: 2023-11-07 — End: 2023-09-11

## 2023-09-10 MED ORDER — SEMAGLUTIDE-WEIGHT MANAGEMENT 1.7 MG/0.75ML ~~LOC~~ SOAJ
1.7000 mg | SUBCUTANEOUS | 0 refills | Status: DC
Start: 2023-12-06 — End: 2023-09-11

## 2023-09-10 MED ORDER — SEMAGLUTIDE-WEIGHT MANAGEMENT 0.5 MG/0.5ML ~~LOC~~ SOAJ
0.5000 mg | SUBCUTANEOUS | 0 refills | Status: DC
Start: 2023-10-09 — End: 2023-09-11

## 2023-09-10 NOTE — Progress Notes (Unsigned)
Subjective:  Patient ID: Alicia Hoover, female    DOB: 1976-05-10  Age: 47 y.o. MRN: [id number]  CC: No chief complaint on file.   HPI Alicia Hoover presents for ***  Outpatient Medications Prior to Visit  Medication Sig Dispense Refill   fluocinonide-emollient (LIDEX-E) 0.05 % cream APPLY TO AFFECTED AREA TWICE A DAY (Patient taking differently: Apply 1 Application topically as needed.) 60 g 1   fluticasone (FLONASE) 50 MCG/ACT nasal spray Place 2 sprays into the nose as needed for allergies.     ibuprofen (ADVIL) 800 MG tablet TAKE 1 TABLET BY MOUTH EVERY 8 HOURS AS NEEDED 30 tablet 5   levocetirizine (XYZAL) 5 MG tablet TAKE 1 TABLET BY MOUTH EVERY DAY IN THE EVENING (Patient taking differently: Take 5 mg by mouth as needed for allergies.) 90 tablet 1   methimazole (TAPAZOLE) 5 MG tablet Take 1 tablet (5 mg total) by mouth daily. 90 tablet 3   montelukast (SINGULAIR) 10 MG tablet TAKE 1 TABLET BY MOUTH EVERYDAY AT BEDTIME 90 tablet 1   Multiple Vitamin (MULTIVITAMIN) tablet Take 1 tablet by mouth daily.     norgestimate-ethinyl estradiol (ORTHO-CYCLEN) 0.25-35 MG-MCG tablet Take 1 tablet by mouth daily. 84 tablet 4   nystatin-triamcinolone ointment (MYCOLOG) Apply 1 Application topically 2 (two) times daily. (Patient taking differently: Apply 1 Application topically as needed.) 30 g 0   ondansetron (ZOFRAN-ODT) 4 MG disintegrating tablet Take 4 mg by mouth as needed for nausea or vomiting.     pantoprazole (PROTONIX) 40 MG tablet TAKE 1 TABLET BY MOUTH TWICE A DAY 180 tablet 1   triamterene-hydrochlorothiazide (DYAZIDE) 37.5-25 MG capsule TAKE 1 EACH (1 CAPSULE TOTAL) BY MOUTH DAILY. 90 capsule 0   Trospium Chloride 60 MG CP24 Take 1 capsule (60 mg total) by mouth daily. 30 capsule 11   acetaminophen (TYLENOL 8 HOUR) 650 MG CR tablet Take 1 tablet (650 mg total) by mouth every 8 (eight) hours as needed for pain. 15 tablet 0   tirzepatide (ZEPBOUND) 2.5 MG/0.5ML Pen Inject 2.5 mg  into the skin once a week. 4 mL 0   No facility-administered medications prior to visit.    ROS Review of Systems  Objective:  BP (!) 150/96 (BP Location: Left Arm, Patient Position: Sitting, Cuff Size: Large)   Pulse 74   Temp 98.3 F (36.8 C) (Oral)   Ht 5' 5.5" (1.664 m)   Wt (!) 325 lb 12.8 oz (147.8 kg)   LMP 08/31/2023 (Approximate)   SpO2 93%   BMI 53.39 kg/m   BP Readings from Last 3 Encounters:  09/10/23 (!) 150/96  07/18/23 (!) 142/70  05/27/23 135/81    Wt Readings from Last 3 Encounters:  09/10/23 (!) 325 lb 12.8 oz (147.8 kg)  05/27/23 (!) 319 lb (144.7 kg)  03/18/23 (!) 317 lb (143.8 kg)    Physical Exam  Lab Results  Component Value Date   WBC 10.2 03/18/2023   HGB 12.8 03/18/2023   HCT 40.1 03/18/2023   PLT 376.0 03/18/2023   GLUCOSE 98 03/18/2023   CHOL 138 10/05/2021   TRIG 140.0 10/05/2021   HDL 35.80 (L) 10/05/2021   LDLDIRECT 57.0 03/27/2017   LDLCALC 74 10/05/2021   ALT 26 03/18/2023   AST 23 03/18/2023   NA 138 03/18/2023   K 3.8 03/18/2023   CL 102 03/18/2023   CREATININE 0.82 03/18/2023   BUN 14 03/18/2023   CO2 28 03/18/2023   TSH 2.51 03/06/2023   HGBA1C  5.9 07/25/2022    No results found.  Assessment & Plan:  There are no diagnoses linked to this encounter.   Follow-up: No follow-ups on file.  Sanda Linger, MD

## 2023-09-10 NOTE — Patient Instructions (Signed)
Hypertension, Adult High blood pressure (hypertension) is when the force of blood pumping through the arteries is too strong. The arteries are the blood vessels that carry blood from the heart throughout the body. Hypertension forces the heart to work harder to pump blood and may cause arteries to become narrow or stiff. Untreated or uncontrolled hypertension can lead to a heart attack, heart failure, a stroke, kidney disease, and other problems. A blood pressure reading consists of a higher number over a lower number. Ideally, your blood pressure should be below 120/80. The first ("top") number is called the systolic pressure. It is a measure of the pressure in your arteries as your heart beats. The second ("bottom") number is called the diastolic pressure. It is a measure of the pressure in your arteries as the heart relaxes. What are the causes? The exact cause of this condition is not known. There are some conditions that result in high blood pressure. What increases the risk? Certain factors may make you more likely to develop high blood pressure. Some of these risk factors are under your control, including: Smoking. Not getting enough exercise or physical activity. Being overweight. Having too much fat, sugar, calories, or salt (sodium) in your diet. Drinking too much alcohol. Other risk factors include: Having a personal history of heart disease, diabetes, high cholesterol, or kidney disease. Stress. Having a family history of high blood pressure and high cholesterol. Having obstructive sleep apnea. Age. The risk increases with age. What are the signs or symptoms? High blood pressure may not cause symptoms. Very high blood pressure (hypertensive crisis) may cause: Headache. Fast or irregular heartbeats (palpitations). Shortness of breath. Nosebleed. Nausea and vomiting. Vision changes. Severe chest pain, dizziness, and seizures. How is this diagnosed? This condition is diagnosed by  measuring your blood pressure while you are seated, with your arm resting on a flat surface, your legs uncrossed, and your feet flat on the floor. The cuff of the blood pressure monitor will be placed directly against the skin of your upper arm at the level of your heart. Blood pressure should be measured at least twice using the same arm. Certain conditions can cause a difference in blood pressure between your right and left arms. If you have a high blood pressure reading during one visit or you have normal blood pressure with other risk factors, you may be asked to: Return on a different day to have your blood pressure checked again. Monitor your blood pressure at home for 1 week or longer. If you are diagnosed with hypertension, you may have other blood or imaging tests to help your health care provider understand your overall risk for other conditions. How is this treated? This condition is treated by making healthy lifestyle changes, such as eating healthy foods, exercising more, and reducing your alcohol intake. You may be referred for counseling on a healthy diet and physical activity. Your health care provider may prescribe medicine if lifestyle changes are not enough to get your blood pressure under control and if: Your systolic blood pressure is above 130. Your diastolic blood pressure is above 80. Your personal target blood pressure may vary depending on your medical conditions, your age, and other factors. Follow these instructions at home: Eating and drinking  Eat a diet that is high in fiber and potassium, and low in sodium, added sugar, and fat. An example of this eating plan is called the DASH diet. DASH stands for Dietary Approaches to Stop Hypertension. To eat this way: Eat   plenty of fresh fruits and vegetables. Try to fill one half of your plate at each meal with fruits and vegetables. Eat whole grains, such as whole-wheat pasta, brown rice, or whole-grain bread. Fill about one  fourth of your plate with whole grains. Eat or drink low-fat dairy products, such as skim milk or low-fat yogurt. Avoid fatty cuts of meat, processed or cured meats, and poultry with skin. Fill about one fourth of your plate with lean proteins, such as fish, chicken without skin, beans, eggs, or tofu. Avoid pre-made and processed foods. These tend to be higher in sodium, added sugar, and fat. Reduce your daily sodium intake. Many people with hypertension should eat less than 1,500 mg of sodium a day. Do not drink alcohol if: Your health care provider tells you not to drink. You are pregnant, may be pregnant, or are planning to become pregnant. If you drink alcohol: Limit how much you have to: 0-1 drink a day for women. 0-2 drinks a day for men. Know how much alcohol is in your drink. In the [place] one drink equals one 12 oz bottle of beer (355 mL), one 5 oz glass of wine (148 mL), or one 1 oz glass of hard liquor (44 mL). Lifestyle  Work with your health care provider to maintain a healthy body weight or to lose weight. Ask what an ideal weight is for you. Get at least 30 minutes of exercise that causes your heart to beat faster (aerobic exercise) most days of the week. Activities may include walking, swimming, or biking. Include exercise to strengthen your muscles (resistance exercise), such as Pilates or lifting weights, as part of your weekly exercise routine. Try to do these types of exercises for 30 minutes at least 3 days a week. Do not use any products that contain nicotine or tobacco. These products include cigarettes, chewing tobacco, and vaping devices, such as e-cigarettes. If you need help quitting, ask your health care provider. Monitor your blood pressure at home as told by your health care provider. Keep all follow-up visits. This is important. Medicines Take over-the-counter and prescription medicines only as told by your health care provider. Follow directions carefully. Blood  pressure medicines must be taken as prescribed. Do not skip doses of blood pressure medicine. Doing this puts you at risk for problems and can make the medicine less effective. Ask your health care provider about side effects or reactions to medicines that you should watch for. Contact a health care provider if you: Think you are having a reaction to a medicine you are taking. Have headaches that keep coming back (recurring). Feel dizzy. Have swelling in your ankles. Have trouble with your vision. Get help right away if you: Develop a severe headache or confusion. Have unusual weakness or numbness. Feel faint. Have severe pain in your chest or abdomen. Vomit repeatedly. Have trouble breathing. These symptoms may be an emergency. Get help right away. Call 911. Do not wait to see if the symptoms will go away. Do not drive yourself to the hospital. Summary Hypertension is when the force of blood pumping through your arteries is too strong. If this condition is not controlled, it may put you at risk for serious complications. Your personal target blood pressure may vary depending on your medical conditions, your age, and other factors. For most people, a normal blood pressure is less than 120/80. Hypertension is treated with lifestyle changes, medicines, or a combination of both. Lifestyle changes include losing weight, eating a healthy,   low-sodium diet, exercising more, and limiting alcohol. This information is not intended to replace advice given to you by your health care provider. Make sure you discuss any questions you have with your health care provider. Document Revised: 08/01/2021 Document Reviewed: 08/01/2021 Elsevier Patient Education  2024 [employer].  

## 2023-09-11 ENCOUNTER — Telehealth: Payer: Self-pay | Admitting: Internal Medicine

## 2023-09-11 ENCOUNTER — Other Ambulatory Visit: Payer: Self-pay

## 2023-09-11 ENCOUNTER — Encounter: Payer: Self-pay | Admitting: Internal Medicine

## 2023-09-11 ENCOUNTER — Ambulatory Visit (INDEPENDENT_AMBULATORY_CARE_PROVIDER_SITE_OTHER): Payer: BC Managed Care – PPO | Admitting: Internal Medicine

## 2023-09-11 ENCOUNTER — Other Ambulatory Visit (HOSPITAL_COMMUNITY): Payer: Self-pay

## 2023-09-11 VITALS — BP 126/80 | Ht 65.5 in | Wt 324.0 lb

## 2023-09-11 DIAGNOSIS — E0590 Thyrotoxicosis, unspecified without thyrotoxic crisis or storm: Secondary | ICD-10-CM

## 2023-09-11 DIAGNOSIS — E0500 Thyrotoxicosis with diffuse goiter without thyrotoxic crisis or storm: Secondary | ICD-10-CM | POA: Diagnosis not present

## 2023-09-11 DIAGNOSIS — E041 Nontoxic single thyroid nodule: Secondary | ICD-10-CM

## 2023-09-11 MED ORDER — SEMAGLUTIDE-WEIGHT MANAGEMENT 1.7 MG/0.75ML ~~LOC~~ SOAJ
1.7000 mg | SUBCUTANEOUS | 0 refills | Status: DC
Start: 2023-12-07 — End: 2023-12-11
  Filled 2023-09-11: qty 3, 28d supply, fill #0

## 2023-09-11 MED ORDER — SEMAGLUTIDE-WEIGHT MANAGEMENT 1 MG/0.5ML ~~LOC~~ SOAJ
1.0000 mg | SUBCUTANEOUS | 0 refills | Status: AC
Start: 2023-11-08 — End: 2023-12-06
  Filled 2023-09-11: qty 2, 28d supply, fill #0

## 2023-09-11 MED ORDER — SEMAGLUTIDE-WEIGHT MANAGEMENT 2.4 MG/0.75ML ~~LOC~~ SOAJ
2.4000 mg | SUBCUTANEOUS | 0 refills | Status: DC
Start: 2024-01-05 — End: 2023-12-11
  Filled 2023-09-11: qty 3, 28d supply, fill #0

## 2023-09-11 MED ORDER — SEMAGLUTIDE-WEIGHT MANAGEMENT 0.5 MG/0.5ML ~~LOC~~ SOAJ
0.5000 mg | SUBCUTANEOUS | 0 refills | Status: AC
Start: 2023-10-10 — End: 2023-11-07
  Filled 2023-09-11: qty 2, 28d supply, fill #0

## 2023-09-11 MED ORDER — SEMAGLUTIDE-WEIGHT MANAGEMENT 0.25 MG/0.5ML ~~LOC~~ SOAJ
0.2500 mg | SUBCUTANEOUS | 0 refills | Status: AC
Start: 2023-09-11 — End: 2023-10-09
  Filled 2023-09-11 (×2): qty 2, 28d supply, fill #0

## 2023-09-11 NOTE — Telephone Encounter (Signed)
Patient called and said that her pharmacy does not have the wygovey .25 but the [facility] does.  Please resend the .25 dose to them.

## 2023-09-11 NOTE — Telephone Encounter (Signed)
Please resend

## 2023-09-11 NOTE — Progress Notes (Unsigned)
Name: Alicia Hoover  MRN/ DOB: [id number], 1976-10-08    Age/ Sex: 47 y.o., female     PCP: Etta Grandchild, MD   Reason for Endocrinology Evaluation: Hyperthyroidism     Initial Endocrinology Clinic Visit: 07/10/2019    PATIENT IDENTIFIER: Alicia Hoover is a 47 y.o., female with a past medical history of HTN, Obesity and Depression. She has followed with [facility] since 07/10/2019 for consultative assistance with management of her hyperthyroidism  HISTORICAL SUMMARY: The patient was first diagnosed with subclinical hyperthyroidism in 04/2019 with a TSh of 0.31 uIU/mL . She was started on Methimazole 04/2019 TRAb elevated at 14.60 IU/L  No Amiodarone  Was on Biotin until 05/2019   No radiation exposure.   Brother with thyroid disease   Thyroid ultrasound revealed MNG 03/2023 with left inferior 3.3 cm nodule meeting FNA criteria.       HYPOKALEMIA HISTORY: Pt has been noted with persistent and spontaneous hypokalemia since 08/2020 despite being on spironolactone.  Aldo was 10 mg/DL, renin was normal at 6.578 and normal Aldo/renin ratio-of note these were done while the patient on spironolactone Patient did not submit 24-hour urinary cortisol   SUBJECTIVE:    Today (09/11/2023):  Alicia Hoover is here for a follow up on hyperthyroidism secondary to Graves' disease and multinodular goiter.  Unfortunately, she has not had her FNA of the left inferior 3.3 cm nodule, the order was placed in June 2024 Weight continues to fluctuate Denies local neck swelling  Denies diarrhea or constipation  Denies local neck symptoms  Denies eye symptoms  Denies palpitations  Denies tremors     Methimazole 5 mg, 6 days a week ( Skips Sunday )     HISTORY:  Past Medical History:  Past Medical History:  Diagnosis Date   Asthma    GERD (gastroesophageal reflux disease)    Hypersomnia with sleep apnea    Hypertension    Migraine    Prediabetes    Rhinitis     Past Surgical History:  Past Surgical History:  Procedure Laterality Date   CHOLECYSTECTOMY     TOOTH EXTRACTION     Social History:  reports that she quit smoking about 23 years ago. Her smoking use included cigarettes. She started smoking about 24 years ago. She has a 0.1 pack-year smoking history. She has never used smokeless tobacco. She reports current alcohol use. She reports that she does not use drugs. Family History:  Family History  Problem Relation Age of Onset   Diabetes Mother    COPD Mother    Rheum arthritis Mother    Diabetes Father    Early death Father    Hyperlipidemia Brother    Thyroid disease Brother    Alcohol abuse Neg Hx    Cancer Neg Hx    Depression Neg Hx    Drug abuse Neg Hx    Hearing loss Neg Hx    Heart disease Neg Hx    Hypertension Neg Hx    Stroke Neg Hx      HOME MEDICATIONS: Allergies as of 09/11/2023       Reactions   Codeine Hives, Nausea And Vomiting   Tramadol Diarrhea, Nausea And Vomiting        Medication List        Accurate as of September 11, 2023  7:42 AM. If you have any questions, ask your nurse or doctor.          fluocinonide-emollient 0.05 %  cream Commonly known as: LIDEX-E APPLY TO AFFECTED AREA TWICE A DAY What changed: See the new instructions.   fluticasone 50 MCG/ACT nasal spray Commonly known as: FLONASE Place 2 sprays into the nose as needed for allergies.   ibuprofen 800 MG tablet Commonly known as: ADVIL TAKE 1 TABLET BY MOUTH EVERY 8 HOURS AS NEEDED   Insulin Pen Needle 32G X 6 MM Misc 1 Act by Does not apply route once a week.   levocetirizine 5 MG tablet Commonly known as: XYZAL TAKE 1 TABLET BY MOUTH EVERY DAY IN THE EVENING What changed:  how much to take how to take this when to take this reasons to take this   methimazole 5 MG tablet Commonly known as: TAPAZOLE Take 1 tablet (5 mg total) by mouth daily.   montelukast 10 MG tablet Commonly known as: SINGULAIR TAKE 1  TABLET BY MOUTH EVERYDAY AT BEDTIME   multivitamin tablet Take 1 tablet by mouth daily.   norgestimate-ethinyl estradiol 0.25-35 MG-MCG tablet Commonly known as: ORTHO-CYCLEN Take 1 tablet by mouth daily.   nystatin-triamcinolone ointment Commonly known as: MYCOLOG Apply 1 Application topically 2 (two) times daily. What changed:  when to take this reasons to take this   ondansetron 4 MG disintegrating tablet Commonly known as: ZOFRAN-ODT Take 4 mg by mouth as needed for nausea or vomiting.   pantoprazole 40 MG tablet Commonly known as: PROTONIX TAKE 1 TABLET BY MOUTH TWICE A DAY   Semaglutide-Weight Management 0.25 MG/0.5ML Soaj Inject 0.25 mg into the skin once a week for 28 days.   Semaglutide-Weight Management 0.5 MG/0.5ML Soaj Inject 0.5 mg into the skin once a week for 28 days. Start taking on: October 09, 2023   Semaglutide-Weight Management 1 MG/0.5ML Soaj Inject 1 mg into the skin once a week for 28 days. Start taking on: November 07, 2023   Semaglutide-Weight Management 1.7 MG/0.75ML Soaj Inject 1.7 mg into the skin once a week for 28 days. Start taking on: December 06, 2023   Semaglutide-Weight Management 2.4 MG/0.75ML Soaj Inject 2.4 mg into the skin once a week for 28 days. Start taking on: January 04, 2024   triamterene-hydrochlorothiazide 37.5-25 MG capsule Commonly known as: DYAZIDE TAKE 1 EACH (1 CAPSULE TOTAL) BY MOUTH DAILY.   Trospium Chloride 60 MG Cp24 Take 1 capsule (60 mg total) by mouth daily.          OBJECTIVE:   PHYSICAL EXAM: VS: BP 126/80 (BP Location: Left Arm, Patient Position: Sitting, Cuff Size: Large)   Ht 5' 5.5" (1.664 m)   Wt (!) 324 lb (147 kg)   LMP 08/31/2023 (Approximate)   BMI 53.10 kg/m    EXAM: General: Pt appears well and is in NAD  Eyes: External eye exam normal without stare, lid lag or exophthalmos.  EOM intact.    Neck: General: Supple without adenopathy. Thyroid: Thyroid size normal.  No goiter or  nodules appreciated.  Lungs: Clear with good BS bilat   Heart: Auscultation: RRR.  Extremities: BL LE: Trace pretibial edema   Mental Status: Judgment, insight: Intact Orientation: Oriented to time, place, and person Mood and affect: No depression, anxiety, or agitation     DATA REVIEWED:  Latest Reference Range & Units 09/10/23 15:32  TSH 0.35 - 5.50 uIU/mL 1.50    Latest Reference Range & Units 09/10/23 15:32  WBC 4.0 - 10.5 K/uL 10.5  RBC 3.87 - 5.11 Mil/uL 5.27 (H)  Hemoglobin 12.0 - 15.0 g/dL 54.0  HCT 98.1 -  46.0 % 41.5  MCV 78.0 - 100.0 fl 78.8  MCHC 30.0 - 36.0 g/dL 16.1  RDW 09.6 - 04.5 % 14.8  Platelets 150.0 - 400.0 K/uL 391.0  Neutrophils 43.0 - 77.0 % 62.8  Lymphocytes 12.0 - 46.0 % 26.9  Monocytes Relative 3.0 - 12.0 % 8.1  Eosinophil 0.0 - 5.0 % 1.8  Basophil 0.0 - 3.0 % 0.4  NEUT# 1.4 - 7.7 K/uL 6.6  Lymphs Abs 0.7 - 4.0 K/uL 2.8  Monocyte # 0.1 - 1.0 K/uL 0.9  Eosinophils Absolute 0.0 - 0.7 K/uL 0.2  Basophils Absolute 0.0 - 0.1 K/uL 0.0    Latest Reference Range & Units 09/10/23 15:32  Sodium 135 - 145 mEq/L 137  Potassium 3.5 - 5.1 mEq/L 3.8  Chloride 96 - 112 mEq/L 100  CO2 19 - 32 mEq/L 32  Glucose 70 - 99 mg/dL 92  BUN 6 - 23 mg/dL 16  Creatinine 4.09 - 8.11 mg/dL 9.14  Calcium 8.4 - 78.2 mg/dL 9.4  GFR >95.62 mL/min 74.57      Thyroid ultrasound 04/02/2023   Estimated total number of nodules >/= 1 cm: 3   Number of spongiform nodules >/=  2 cm not described below (TR1): 0   Number of mixed cystic and solid nodules >/= 1.5 cm not described below (TR2): 0   _________________________________________________________   Nodule # 1:   Location: Isthmus; Mid   Maximum size: 2.4 cm; Other 2 dimensions: 2.0 x 2.0 cm   Composition: solid/almost completely solid (2)   Echogenicity: isoechoic (1)   Shape: not taller-than-wide (0)   Margins: ill-defined (0)   Echogenic foci: none (0)   ACR TI-RADS total points: 3.   ACR TI-RADS  risk category: TR3 (3 points).   ACR TI-RADS recommendations:   *Given size (>/= 1.5 - 2.4 cm) and appearance, a follow-up ultrasound in 1 year should be considered based on TI-RADS criteria.   _________________________________________________________   Nodule # 2:   Location: Right; Inferior   Maximum size: 1.7 cm; Other 2 dimensions: 1.6 x 1.5 cm   Composition: solid/almost completely solid (2)   Echogenicity: isoechoic (1)   Shape: not taller-than-wide (0)   Margins: ill-defined (0)   Echogenic foci: none (0)   ACR TI-RADS total points: 3.   ACR TI-RADS risk category: TR3 (3 points).   ACR TI-RADS recommendations:   *Given size (>/= 1.5 - 2.4 cm) and appearance, a follow-up ultrasound in 1 year should be considered based on TI-RADS criteria.   _________________________________________________________   Nodule # 3:   Location: Left; Inferior   Maximum size: 3.3 cm; Other 2 dimensions: 3.2 x 2.6 cm   Composition: solid/almost completely solid (2)   Echogenicity: isoechoic (1)   Shape: not taller-than-wide (0)   Margins: ill-defined (0)   Echogenic foci: none (0)   ACR TI-RADS total points: 3.   ACR TI-RADS risk category: TR3 (3 points).   ACR TI-RADS recommendations:   **Given size (>/= 2.5 cm) and appearance, fine needle aspiration of this mildly suspicious nodule should be considered based on TI-RADS criteria.   _________________________________________________________   No hypervascularity or regional adenopathy.   IMPRESSION: 3.3 cm left inferior thyroid TR 3 nodule meets criteria for biopsy as above. This correlates with the chest CT finding.   Isthmus and right inferior thyroid TR 3 nodules meet criteria for follow-up in 1 year.    Old records , labs and images have been reviewed.     ASSESSMENT / PLAN /  RECOMMENDATIONS:   Hyperthyroidism Secondary to Graves' Disease:   - Clinically she is euthyroid  - No local neck  symptoms -She had opted to continue on Methimazole vs  alternative options of treatment such as radioactive iodine ablation versus surgery -TSH through PCPs office have been reviewed and it is within normal range, no change Medications   Continue methimazole 5 mg  , 1 tablet 6 days a week     2. Graves' Disease:    - No extrathyroidal manifestations of Grave's disease.   3. Right thyroid nodule :  -Unfortunately, she has not had her FNA yet.  We did discuss the risk of thyroid cancer 3-5% and the importance of addressing this appropriately -Patient will reschedule thyroid nodule biopsy  F/U in 6 months   Labs in 2 months     Signed electronically by: Lyndle Herrlich, MD  [facility]  [facility] [address] [place] [place], [place] [address] Phone: [phone] FAX: [phone]      CC: Etta Grandchild, MD [address] [place] [address] Phone: [phone]  Fax: [phone]   Return to Endocrinology clinic as below: Future Appointments  Date Time Provider Department Center  09/12/2023  8:00 AM [facility]LE MM 1 [facility]  09/12/2023  8:15 AM Tarry Kos, MD [facility] None  09/19/2023  3:30 PM Waymon Budge, MD [facility] None

## 2023-09-12 ENCOUNTER — Other Ambulatory Visit: Payer: Self-pay

## 2023-09-12 ENCOUNTER — Ambulatory Visit
Admission: RE | Admit: 2023-09-12 | Discharge: 2023-09-12 | Disposition: A | Discharge status: Home / Self Care | Payer: BC Managed Care – PPO | Source: Ambulatory Visit | Attending: Internal Medicine | Admitting: Internal Medicine

## 2023-09-12 ENCOUNTER — Ambulatory Visit: Payer: BC Managed Care – PPO | Admitting: Orthopaedic Surgery

## 2023-09-12 DIAGNOSIS — M160 Bilateral primary osteoarthritis of hip: Secondary | ICD-10-CM | POA: Diagnosis not present

## 2023-09-12 DIAGNOSIS — Z01419 Encounter for gynecological examination (general) (routine) without abnormal findings: Secondary | ICD-10-CM

## 2023-09-12 DIAGNOSIS — M1611 Unilateral primary osteoarthritis, right hip: Secondary | ICD-10-CM | POA: Diagnosis not present

## 2023-09-12 DIAGNOSIS — Z6841 Body Mass Index (BMI) 40.0 and over, adult: Secondary | ICD-10-CM

## 2023-09-12 DIAGNOSIS — M1612 Unilateral primary osteoarthritis, left hip: Secondary | ICD-10-CM | POA: Diagnosis not present

## 2023-09-12 NOTE — Progress Notes (Signed)
Office Visit Note   Patient: Alicia Hoover           Date of Birth: 08-20-1976           MRN: [id number] Visit Date: 09/12/2023              Requested by: Etta Grandchild, MD [address] [place],  [place] [address] PCP: Etta Grandchild, MD   Assessment & Plan: Visit Diagnoses:  1. Primary osteoarthritis of left hip   2. Primary osteoarthritis of right hip   3. Morbid obesity (HCC)   4. Body mass index 50.0-59.9, adult (HCC)     Plan: Alicia Hoover is a 47 year old female with bilateral hip pain from osteoarthritis.  Fortunately she has actually gained weight which has made the problem worse.  I will send her back to physical therapy or [facility] for aquatic therapy.  She will continue to make all efforts at weight loss.  She will also think about doing cortisone injections.  Otherwise take her current medications as needed.  Follow-Up Instructions: No follow-ups on file.   Orders:  Orders Placed This Encounter  Procedures   XR HIPS BILAT W OR W/O PELVIS 3-4 VIEWS   XR Lumbar Spine 2-3 Views   No orders of the defined types were placed in this encounter.     Procedures: No procedures performed   Clinical Data: No additional findings.   Subjective: Chief Complaint  Patient presents with   Left Hip - Pain   Right Hip - Pain   Lower Back - Pain    HPI Alicia Hoover is a 47 year old female here for evaluation of bilateral hip pain worse on the right.  She has seen [place] in the past and we sent her to physical therapy which was interrupted by the death of her mother.  She would like to go back to physical therapy especially aquatic therapy.  She is also interested in exploring noninvasive options.  In regards to her weight she has actually gained weight.  Tylenol which does not provide any significant relief.  Review of Systems  Constitutional: Negative.   HENT: Negative.    Eyes: Negative.   Respiratory: Negative.    Cardiovascular: Negative.   Endocrine: Negative.    Musculoskeletal: Negative.   Neurological: Negative.   Hematological: Negative.   Psychiatric/Behavioral: Negative.    All other systems reviewed and are negative.    Objective: Vital Signs: LMP 08/31/2023 (Approximate)   Physical Exam Vitals and nursing note reviewed.  Constitutional:      Appearance: She is well-developed.  HENT:     Head: Normocephalic and atraumatic.  Pulmonary:     Effort: Pulmonary effort is normal.  Abdominal:     Palpations: Abdomen is soft.  Musculoskeletal:     Cervical back: Neck supple.  Skin:    General: Skin is warm.     Capillary Refill: Capillary refill takes less than 2 seconds.  Neurological:     Mental Status: She is alert and oriented to person, place, and time.  Psychiatric:        Behavior: Behavior normal.        Thought Content: Thought content normal.        Judgment: Judgment normal.     Ortho Exam Exam of both hips is unchanged from prior visit. Specialty Comments:  No specialty comments available.  Imaging: No results found.   PMFS History: Patient Active Problem List   Diagnosis Date Noted   Primary osteoarthritis of  right hip 09/12/2023   Screening mammogram for breast cancer 09/10/2023   Allergic contact dermatitis due to drugs in contact with skin 07/25/2022   Primary osteoarthritis of left hip 12/07/2021   Primary osteoarthritis of both knees 12/07/2021   Hyperthyroidism 10/19/2021   Encounter for general adult medical examination with abnormal findings 10/05/2021   Screen for colon cancer 10/05/2021   GERD (gastroesophageal reflux disease)    Binge-eating disorder, moderate 08/18/2020   Migraine without aura and without status migrainosus, not intractable 08/18/2020   Seasonal allergic rhinitis due to pollen 04/12/2020   Graves disease 10/12/2019   OAB (overactive bladder) 04/08/2019   Depression with anxiety 04/08/2019   Visit for screening mammogram 03/27/2017   Eczema, allergic 03/08/2016    Essential hypertension, benign 02/24/2014   Morbid obesity (HCC) 11/01/2010   Hypersomnia with sleep apnea 10/16/2010   Past Medical History:  Diagnosis Date   Asthma    GERD (gastroesophageal reflux disease)    Hypersomnia with sleep apnea    Hypertension    Migraine    Prediabetes    Rhinitis     Family History  Problem Relation Age of Onset   Diabetes Mother    COPD Mother    Rheum arthritis Mother    Diabetes Father    Early death Father    Hyperlipidemia Brother    Thyroid disease Brother    Alcohol abuse Neg Hx    Cancer Neg Hx    Depression Neg Hx    Drug abuse Neg Hx    Hearing loss Neg Hx    Heart disease Neg Hx    Hypertension Neg Hx    Stroke Neg Hx    Breast cancer Neg Hx     Past Surgical History:  Procedure Laterality Date   CHOLECYSTECTOMY     TOOTH EXTRACTION     Social History   Occupational History   Occupation: [employer]    Employer: [employer]  Tobacco Use   Smoking status: Former    Current packs/day: 0.00    Average packs/day: 0.1 packs/day for 1 year (0.1 ttl pk-yrs)    Types: Cigarettes    Start date: 10/08/1998    Quit date: 10/09/1999    Years since quitting: 23.9   Smokeless tobacco: Never  Vaping Use   Vaping status: Never Used  Substance and Sexual Activity   Alcohol use: Yes    Comment: 1 glass of white wine monthly   Drug use: No   Sexual activity: Yes    Birth control/protection: Pill, OCP

## 2023-09-13 ENCOUNTER — Other Ambulatory Visit: Payer: Self-pay | Admitting: Internal Medicine

## 2023-09-13 DIAGNOSIS — K2100 Gastro-esophageal reflux disease with esophagitis, without bleeding: Secondary | ICD-10-CM

## 2023-09-18 NOTE — Progress Notes (Unsigned)
05/31/14- 38 yoF former smoker followed for OSA complicated by obesity, HBP, allergic rhinitis Hx of daytime sleepiness, loud snoring, headaches. NPSG 05/21/14- Mild OSA, AHI 9.4/ hr, desat to 81%, weight 313 lbs She fights daytime sleepiness and is looking forward to getting machine. We discussed sleep hygiene, alternatives to CPAP, importance of weight, and driving responsibility. She is working on weight.   ========================================================================== [birth year]46 yoF for sleep evalution with hx OSA, last seen by me in 2015. Medical problem list includes Allergic Rhinitis, Morbid Obesity, Depression, Hyperthyroid, GERD, HTN, Migraine, Body weight today-329 lbs Epworth score -24 NPSG 2003 reportedly qualified for CPAP but she dropped off NPSG 10/17/2010- AHI 0.9/ hr, weight 290 lbs, did not qualify for CPAP. NPSG 05/21/14- Mild OSA, AHI 9.4/ hr, desat to 81%, weight 313 lbs> Advanced> CPAP auto 5-20 Discussed the use of AI scribe software for clinical note transcription with the patient, who gave verbal consent to proceed.  History of Present Illness   The patient, with a history of sleep apnea, presents for an updated sleep apnea machine. She has been using her current machine inconsistently. The last sleep study was conducted in 2015 and the patient is due for a new one. The patient denies needing any sleep aids and does not consume caffeine or energy drinks to stay awake. She denies any history of surgery in the nose or tonsils and denies any lung or heart trouble. 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 She also has hyperthyroidism, which is being managed by an endocrinologist. Daytime sleepiness is intrusive and family tell her of loud snoring.     ROS-see HPI Constitutional:   No-   weight loss, night sweats, fevers, chills, +fatigue, lassitude. HEENT:   + headaches, No-difficulty swallowing, +tooth/dental problems, sore throat,                   No- sneezing, No-itching, ear ache, +nasal congestion, post nasal drip,  CV:  No-   chest pain, orthopnea, PND, swelling in lower extremities, anasarca,                                  dizziness, palpitations Resp: + shortness of breath with exertion or at rest.              No-   productive cough,  No non-productive cough,  No- coughing up of blood.              No-   change in color of mucus.  No- wheezing.   Skin: No-   rash or lesions. GI:  +heartburn, indigestion, No-abdominal pain, nausea, vomiting, GU:  MS:  No-   joint pain or swelling.   Neuro-     nothing unusual Psych:  No- change in mood or affect. + depression +anxiety.  No memory loss.   OBJ- Physical Exam General- Alert, Oriented, Affect-appropriate, Distress- none acute, +obese, looks tired Skin- rash-none, lesions- none, excoriation- none Lymphadenopathy- none Head- atraumatic            Eyes- Gross vision intact, PERRLA, conjunctivae and secretions clear            Ears- Hearing, canals-normal            Nose- Clear, no-Septal dev, mucus, polyps, erosion, perforation             Throat- Mallampati IV , mucosa clear , drainage-  none, tonsils- atrophic, own teeth Neck- flexible , trachea midline, no stridor , thyroid nl, carotid no bruit Chest - symmetrical excursion , unlabored           Heart/CV- RRR , no murmur , no gallop  , no rub, nl s1 s2                           - JVD- none , edema- none, stasis changes- none, varices- none           Lung- clear to P&A, wheeze- none, cough- none , dullness-none, rub- none           Chest wall-  Abd-  Br/ Gen/ Rectal- Not done, not indicated Extrem- cyanosis- none, clubbing, none, atrophy- none, strength- nl Neuro- grossly intact to observation  Assessment and Plan     Obstructive sleep apnea Severe daytime sleepiness. Options discussed for testing and treatment Plan- schedule home sleep test  Morbid Obesity Continue to work on weight with diet/ exercise

## 2023-09-19 ENCOUNTER — Encounter: Payer: Self-pay | Admitting: Internal Medicine

## 2023-09-19 ENCOUNTER — Ambulatory Visit: Payer: BC Managed Care – PPO | Admitting: Internal Medicine

## 2023-09-19 VITALS — BP 128/84 | HR 85 | Ht 65.5 in | Wt 329.8 lb

## 2023-09-19 DIAGNOSIS — G4733 Obstructive sleep apnea (adult) (pediatric): Secondary | ICD-10-CM

## 2023-09-19 NOTE — Patient Instructions (Signed)
 Order- schedule home sleep test   dx OSA  Please call me about 2 weeks after your sleep test for results and recommendations

## 2023-09-19 NOTE — Assessment & Plan Note (Signed)
Likely to need external support for meaningful weight loss

## 2023-09-19 NOTE — Assessment & Plan Note (Signed)
Needs updated sleep study to requalify for treatment. Plan- HST

## 2023-09-25 ENCOUNTER — Other Ambulatory Visit (HOSPITAL_COMMUNITY): Payer: Self-pay

## 2023-09-30 ENCOUNTER — Ambulatory Visit: Payer: BC Managed Care – PPO | Admitting: Internal Medicine

## 2023-10-01 ENCOUNTER — Other Ambulatory Visit: Payer: Self-pay | Admitting: Internal Medicine

## 2023-10-01 ENCOUNTER — Other Ambulatory Visit (HOSPITAL_COMMUNITY): Payer: Self-pay

## 2023-10-03 NOTE — Telephone Encounter (Signed)
 Methimazole refill request complete

## 2023-10-05 ENCOUNTER — Other Ambulatory Visit: Payer: Self-pay | Admitting: Internal Medicine

## 2023-10-05 ENCOUNTER — Other Ambulatory Visit: Payer: Self-pay | Admitting: Obstetrics and Gynecology

## 2023-10-05 DIAGNOSIS — I10 Essential (primary) hypertension: Secondary | ICD-10-CM

## 2023-10-08 DIAGNOSIS — G4733 Obstructive sleep apnea (adult) (pediatric): Secondary | ICD-10-CM

## 2023-10-24 ENCOUNTER — Other Ambulatory Visit: Payer: Self-pay | Admitting: Obstetrics and Gynecology

## 2023-10-30 ENCOUNTER — Other Ambulatory Visit: Payer: Self-pay

## 2023-10-30 MED ORDER — NYSTATIN-TRIAMCINOLONE 100000-0.1 UNIT/GM-% EX OINT: 1.0000 | TOPICAL_OINTMENT | CUTANEOUS | 0 refills | Status: AC | PRN

## 2023-10-30 NOTE — Progress Notes (Signed)
Rx sent for [facility]

## 2023-11-08 ENCOUNTER — Telehealth: Payer: Self-pay | Admitting: Internal Medicine

## 2023-11-08 NOTE — Telephone Encounter (Signed)
[facility] calling, Marisol, states they have all docs needed to fill RX for CPAP but do not have the Script itself.   Gardiner Rhyme is @ [phone]

## 2023-11-08 NOTE — Telephone Encounter (Signed)
Patient was seen in 09/2023 and HST ordered. HST has been done but I don't see where patient has been notified. There will need to be a replacement cpap order placed for the patient

## 2023-11-10 NOTE — Telephone Encounter (Signed)
Home sleep test confirmed moderately severe obstructive sleep apnea, AHI 18.9/hr, with drops in blood oxygen level. Treatment for this is medically important and the best treatment for her is CPAP.  Order- new DME, new CPAP auto 5-20, mask of choice, humidifier, supplies, AirView/ card.  She will need a return ov appointment in 32-90 days after getting her machine, per insurance rules.Marland Kitchen

## 2023-11-11 NOTE — Telephone Encounter (Signed)
 Lm x1 for patient.

## 2023-11-12 ENCOUNTER — Encounter (HOSPITAL_BASED_OUTPATIENT_CLINIC_OR_DEPARTMENT_OTHER): Payer: Self-pay

## 2023-11-12 ENCOUNTER — Other Ambulatory Visit (HOSPITAL_BASED_OUTPATIENT_CLINIC_OR_DEPARTMENT_OTHER): Payer: Self-pay

## 2023-11-12 DIAGNOSIS — G4733 Obstructive sleep apnea (adult) (pediatric): Secondary | ICD-10-CM

## 2023-11-12 NOTE — Telephone Encounter (Signed)
Sent a detailed message with results and recommendations. Patient has also been asked to return the call where I LMOM or send a mychart message to let us know she is ok with [place] ordering the CPAP machine.

## 2023-11-19 ENCOUNTER — Other Ambulatory Visit: Payer: Self-pay | Admitting: Obstetrics

## 2023-11-19 DIAGNOSIS — N946 Dysmenorrhea, unspecified: Secondary | ICD-10-CM

## 2023-11-20 ENCOUNTER — Other Ambulatory Visit: Payer: BC Managed Care – PPO

## 2023-12-09 ENCOUNTER — Inpatient Hospital Stay
Admission: RE | Admit: 2023-12-09 | Discharge: 2023-12-09 | Disposition: A | Discharge status: Home / Self Care | Payer: BC Managed Care – PPO | Source: Ambulatory Visit | Attending: Internal Medicine | Admitting: Internal Medicine

## 2023-12-10 ENCOUNTER — Ambulatory Visit: Payer: Self-pay | Admitting: Internal Medicine

## 2023-12-10 NOTE — Telephone Encounter (Signed)
 Copied from CRM [id number]. Topic: Clinical - Red Word Triage >> Dec 10, 2023 10:33 AM Florestine Avers wrote: Red Word that prompted transfer to Nurse Triage: Patient called instating that she has had a cough with head pressure and pain for about 3 days. She has been using over the counter medication and its not helping her. Patient is requesting a same day appointment.    Chief Complaint: Cough Symptoms: cough, head pain/pressure, sore throat Frequency: Ongoing for past 3 days Pertinent Negatives: Patient denies fever, SOB Disposition: [] ED /[] Urgent Care (no appt availability in office) / [x] Appointment(In office/virtual)/ []  [facility]/ [] Home Care/ [] Refused Recommended Disposition /[] [facility]/ []  Follow-up with PCP Additional Notes: Patient stated that she has been having cough, head pressure, and sore throat for the past 3 days. She is taking OTC sinus medication and Mucinex without relief. Patient requesting an appointment. Appointment scheduled for tomorrow.  Reason for Disposition  [1] Continuous (nonstop) coughing interferes with work or school AND [2] no improvement using cough treatment per Care Advice  Answer Assessment - Initial Assessment Questions 1. ONSET: "When did the cough begin?"  3 days ago  2. SEVERITY: "How bad is the cough today?"      Moderate  3. SPUTUM: "Describe the color of your sputum" (none, dry cough; clear, white, yellow, green)     Green/yellow   4. DIFFICULTY BREATHING: "Are you having difficulty breathing?" If Yes, ask: "How bad is it?" (e.g., mild, moderate, severe)    - MILD: No SOB at rest, mild SOB with walking, speaks normally in sentences, can lie down, no retractions, pulse < 100.    - MODERATE: SOB at rest, SOB with minimal exertion and prefers to sit, cannot lie down flat, speaks in phrases, mild retractions, audible wheezing, pulse 100-120.    - SEVERE: Very SOB at rest, speaks in single words, struggling to breathe,  sitting hunched forward, retractions, pulse > 120      SOB yesterday. None today  5. FEVER: "Do you have a fever?" If Yes, ask: "What is your temperature, how was it measured, and when did it start?"     No  6. OTHER SYMPTOMS: "Do you have any other symptoms?" (e.g., runny nose, wheezing, chest pain)       Sore throat, headache/head pressure 7/10  Protocols used: Cough - Acute Productive-A-AH

## 2023-12-11 ENCOUNTER — Ambulatory Visit (INDEPENDENT_AMBULATORY_CARE_PROVIDER_SITE_OTHER): Admitting: Internal Medicine

## 2023-12-11 VITALS — BP 140/88 | HR 80 | Temp 98.3°F | Ht 65.5 in | Wt 336.8 lb

## 2023-12-11 DIAGNOSIS — R6889 Other general symptoms and signs: Secondary | ICD-10-CM | POA: Diagnosis not present

## 2023-12-11 LAB — POCT INFLUENZA A/B
Influenza A, POC: NEGATIVE
Influenza B, POC: NEGATIVE

## 2023-12-11 LAB — POC COVID19 BINAXNOW: SARS Coronavirus 2 Ag: NEGATIVE

## 2023-12-11 MED ORDER — PROMETHAZINE-DM 6.25-15 MG/5ML PO SYRP: 5.0000 mL | ORAL_SOLUTION | Freq: Four times a day (QID) | ORAL | 0 refills | Status: DC | PRN

## 2023-12-11 NOTE — Patient Instructions (Addendum)
 We have checked you for flu and covid-19 which are negative. We have sent in the cough medicine.

## 2023-12-11 NOTE — Progress Notes (Signed)
   Subjective:   Patient ID: Alicia Hoover, female    DOB: November 15, 1975, 48 y.o.   MRN: [id number]  HPI The patient is a 48 YO female coming in for sick visit. Going on 3-4 days. Daughter sick with URI and got antibiotics. She has typical symptoms and taking flonase, xyzal, singulair and otc cold products. Overall still worsening.  Review of Systems  Constitutional:  Positive for activity change and appetite change. Negative for chills, fatigue, fever and unexpected weight change.  HENT:  Positive for congestion, postnasal drip, rhinorrhea, sinus pressure and sore throat. Negative for ear discharge, ear pain, sinus pain, sneezing, tinnitus, trouble swallowing and voice change.   Eyes: Negative.   Respiratory:  Positive for cough. Negative for chest tightness, shortness of breath and wheezing.   Cardiovascular: Negative.   Gastrointestinal:  Positive for diarrhea.  Musculoskeletal:  Positive for myalgias.  Neurological: Negative.     Objective:  Physical Exam Constitutional:      Appearance: She is well-developed. She is obese.  HENT:     Head: Normocephalic and atraumatic.     Comments: Oropharynx with redness and clear drainage, nose with swollen turbinates, TMs normal bilaterally.  Neck:     Thyroid: No thyromegaly.  Cardiovascular:     Rate and Rhythm: Normal rate and regular rhythm.  Pulmonary:     Effort: Pulmonary effort is normal. No respiratory distress.     Breath sounds: Normal breath sounds. No wheezing or rales.  Abdominal:     Palpations: Abdomen is soft.  Musculoskeletal:        General: Tenderness present.     Cervical back: Normal range of motion.  Lymphadenopathy:     Cervical: No cervical adenopathy.  Skin:    General: Skin is warm and dry.  Neurological:     Mental Status: She is alert and oriented to person, place, and time.     Vitals:   12/11/23 1106  BP: (!) 140/88  Pulse: 80  Temp: 98.3 F (36.8 C)  SpO2: 97%  Weight: (!) 336 lb 12.8 oz  (152.8 kg)  Height: 5' 5.5" (1.664 m)    Assessment & Plan:

## 2023-12-11 NOTE — Assessment & Plan Note (Addendum)
 Testing POC for flu and covid-19 done. Reassurance given about typical timeline and symptom management. Continue flonase and xyzal and singulair. Okay to use otc cold products as well. Rx promethazine/dm cough syrup to use as needed for cough.

## 2023-12-18 ENCOUNTER — Other Ambulatory Visit: Payer: Self-pay | Admitting: Internal Medicine

## 2023-12-18 DIAGNOSIS — L239 Allergic contact dermatitis, unspecified cause: Secondary | ICD-10-CM

## 2023-12-18 DIAGNOSIS — J301 Allergic rhinitis due to pollen: Secondary | ICD-10-CM

## 2023-12-18 NOTE — Progress Notes (Signed)
 05/31/14- 38 yoF former smoker followed for OSA complicated by obesity, HBP, allergic rhinitis Hx of daytime sleepiness, loud snoring, headaches. NPSG 05/21/14- Mild OSA, AHI 9.4/ hr, desat to 81%, weight 313 lbs She fights daytime sleepiness and is looking forward to getting machine. We discussed sleep hygiene, alternatives to CPAP, importance of weight, and driving responsibility. She is working on weight.   ========================================================================== 09/19/23 47 yoF for sleep evalution with hx OSA, last seen by me in 2015. Medical problem list includes Allergic Rhinitis, Morbid Obesity, Depression, Hyperthyroid, GERD, HTN, Migraine, Body weight today-329 lbs Epworth score -24 NPSG 2003 reportedly qualified for CPAP but she dropped off NPSG 10/17/2010- AHI 0.9/ hr, weight 290 lbs, did not qualify for CPAP. NPSG 05/21/14- Mild OSA, AHI 9.4/ hr, desat to 81%, weight 313 lbs> Advanced> CPAP auto 5-20  Discussed the use of AI scribe software for clinical note transcription with the patient, who gave verbal consent to proceed. History of Present Illness   The patient, with a history of sleep apnea, presents for an updated sleep apnea machine. She has been using her current machine inconsistently. The last sleep study was conducted in 2015 and the patient is due for a new one. The patient denies needing any sleep aids and does not consume caffeine or energy drinks to stay awake. She denies any history of surgery in the nose or tonsils and denies any lung or heart trouble. 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 She also has hyperthyroidism, which is being managed by an endocrinologist. Daytime sleepiness is intrusive and family tell her of loud snoring.    Assessment and Plan:     Obstructive sleep apnea Severe daytime sleepiness. Options discussed for testing and treatment Plan- schedule home sleep test  Morbid Obesity Continue to work  on weight with diet/ exercise  12/19/23- 47 yoF for sleep evalution with hx OSA, complicated by Allergic Rhinitis, Morbid Obesity, Depression, Hyperthyroid, GERD, HTN, Migraine, HST 10/08/23- AHI 18.9/hr, desat to 72%, body weight 330 lbs CPAP auto 5-20/ [facility], new order 11/12/23 Body weight today- Download compliance- 60%, AHI 0.4/hr Download reviewed. She is working on compliance, but has been acutely ill. She will keep f/u appt in June. Cute visit today,initially reporting sick for "2 days", but stating PCP evaluated with neg COVI and Flu tests last week. Note: Tested positive at this visit for Covid Discussed the use of AI scribe software for clinical note transcription with the patient, who gave verbal consent to proceed. History of Present Illness   The patient, who works at a high school, presents with a" two-day" history of headache, nausea, diarrhea, and cough. She initially attributed her symptoms to spring pollen but now believes she may have caught a viral infection. She denies recent exposure to anyone sick outside of her work environment.  In addition to her acute symptoms, the patient has been using a CPAP machine for an OSA. She reports good compliance with the machine, although she has had some skipped nights due to her current illness. She reports no issues with the mask or pressure settings, but has had difficulty using the machine due to congestion from her current illness.  The patient also reports a history of cough, for which she has previously tried Promethazine without significant relief. She has also tried Mucinex and throat lozenges for symptom management. She has a known allergy to tramadol and codeine.     ROS-see HPI Constitutional:   No-  weight loss, night sweats, fevers, chills, +fatigue, lassitude. HEENT:   + headaches, No-difficulty swallowing, +tooth/dental problems, sore throat,                  No- sneezing, No-itching, ear ache, +nasal congestion, post nasal  drip,  CV:  No-   chest pain, orthopnea, PND, swelling in lower extremities, anasarca,                                   dizziness, palpitations Resp: + shortness of breath with exertion or at rest.              No-   productive cough, + non-productive cough,  No- coughing up of blood.              No-   change in color of mucus.  No- wheezing.   Skin: No-   rash or lesions. GI:  +heartburn, indigestion, No-abdominal pain, nausea, vomiting, GU:  MS:  No-   joint pain or swelling.   Neuro-     nothing unusual Psych:  No- change in mood or affect. + depression +anxiety.  No memory loss.   OBJ- Physical Exam General- Alert, Oriented, Affect-appropriate, Distress- none acute, +obese, +looks unwell Skin- rash-none, lesions- none, excoriation- none Lymphadenopathy- none Head- atraumatic            Eyes- Gross vision intact, PERRLA, conjunctivae and secretions clear            Ears- Hearing, canals-normal            Nose- Clear, no-Septal dev, mucus, polyps, erosion, perforation             Throat- Mallampati IV , mucosa clear , drainage- none, tonsils- atrophic, own teeth Neck- flexible , trachea midline, no stridor , thyroid nl, carotid no bruit Chest - symmetrical excursion , unlabored           Heart/CV- RRR , no murmur , no gallop  , no rub, nl s1 s2                           - JVD- none , edema- none, stasis changes- none, varices- none           Lung-  wheeze+, cough+ , dullness-none, rub- none           Chest wall-  Abd-  Br/ Gen/ Rectal- Not done, not indicated Extrem- cyanosis- none, clubbing, none, atrophy- none, strength- nl Neuro- grossly intact to observation  Assessment and Plan:                                 Tested Positive for Covid today > Paxlovid    Viral infection Symptoms suggest viral etiology, not bacterial. Differential includes COVID-19 and influenza. Negative tests for both previously. Chest discomfort from cough noted. - Order COVID-19 and influenza  tests. - Advise hydration with water or Gatorade. - Recommend throat lozenges and sips of liquids for cough relief. - Suggest Mucinex DM for cough and Kaopectate or Pepto-Bismol for gastrointestinal symptoms.  Obstructive Sleep Apnea (OSA) CPAP therapy effective with adherence. Congestion from illness affects usage. - Continue current CPAP settings. - Encourage consistent CPAP use for at least four hours per night. - Re-evaluate CPAP usage in three months.

## 2023-12-19 ENCOUNTER — Ambulatory Visit: Payer: BC Managed Care – PPO | Admitting: Internal Medicine

## 2023-12-19 VITALS — BP 126/82 | HR 84

## 2023-12-19 DIAGNOSIS — U071 COVID-19: Secondary | ICD-10-CM | POA: Diagnosis not present

## 2023-12-19 DIAGNOSIS — G4733 Obstructive sleep apnea (adult) (pediatric): Secondary | ICD-10-CM

## 2023-12-19 MED ORDER — PAXLOVID (300/100) 20 X 150 MG & 10 X 100MG PO TBPK
3.0000 | ORAL_TABLET | Freq: Two times a day (BID) | ORAL | 0 refills | Status: AC
Start: 2023-12-19 — End: 2023-12-24

## 2023-12-19 MED ORDER — PROMETHAZINE-DM 6.25-15 MG/5ML PO SYRP
5.0000 mL | ORAL_SOLUTION | Freq: Four times a day (QID) | ORAL | 2 refills | Status: DC | PRN
Start: 2023-12-19 — End: 2024-04-22

## 2023-12-19 NOTE — Patient Instructions (Addendum)
 You have tested positive for Covid infection.  I have sent prescription for Paxlovid and for cough syrup.  The Paxlovid will interfere with your birth control med while you are taking it.   Continue CPAP auto 5-20  Order- Flu and Covid tests - done

## 2023-12-22 ENCOUNTER — Encounter: Payer: Self-pay | Admitting: Internal Medicine

## 2023-12-24 ENCOUNTER — Ambulatory Visit: Admitting: Orthopaedic Surgery

## 2023-12-24 ENCOUNTER — Other Ambulatory Visit (INDEPENDENT_AMBULATORY_CARE_PROVIDER_SITE_OTHER): Payer: Self-pay

## 2023-12-24 DIAGNOSIS — Z6841 Body Mass Index (BMI) 40.0 and over, adult: Secondary | ICD-10-CM | POA: Diagnosis not present

## 2023-12-24 DIAGNOSIS — M25552 Pain in left hip: Secondary | ICD-10-CM

## 2023-12-24 MED ORDER — DICLOFENAC SODIUM 75 MG PO TBEC: 75.0000 mg | DELAYED_RELEASE_TABLET | Freq: Two times a day (BID) | ORAL | 2 refills | Status: AC

## 2023-12-24 NOTE — Progress Notes (Signed)
 Office Visit Note   Patient: Alicia Hoover           Date of Birth: 07/10/1976           MRN: [id number] Visit Date: 12/24/2023              Requested by: Etta Grandchild, MD [address] [place],  [place] [address] PCP: Etta Grandchild, MD   Assessment & Plan: Visit Diagnoses:  1. Pain in left hip   2. Body mass index 50.0-59.9, adult [facility])     Plan: Alicia Hoover is a 48 year old female with symptomatic left hip osteoarthritis.  No acute findings on x-rays or exam.  Will send in prescription for diclofenac and make referral to Dr. Shon Baton for hip injection.  The patient meets the AMA guidelines for Morbid (severe) obesity with a BMI > 40.0 and I have recommended weight loss.  Follow-Up Instructions: No follow-ups on file.   Orders:  Orders Placed This Encounter  Procedures   XR HIP UNILAT W OR W/O PELVIS 2-3 VIEWS LEFT   AMB referral to sports medicine   Meds ordered this encounter  Medications   diclofenac (VOLTAREN) 75 MG EC tablet    Sig: Take 1 tablet (75 mg total) by mouth 2 (two) times daily.    Dispense:  30 tablet    Refill:  2      Procedures: No procedures performed   Clinical Data: No additional findings.   Subjective: Chief Complaint  Patient presents with   Left Hip - Pain    HPI Patient returns today for follow-up evaluation of left hip and groin pain.  Difficulty with sitting standing and walking and wiping.  Denies any injuries.  She has been using over-the-counter Advil and Tylenol and heating pad.  She has had pain for 3 days.  Denies any back pain. Review of Systems  Constitutional: Negative.   HENT: Negative.    Eyes: Negative.   Respiratory: Negative.    Cardiovascular: Negative.   Endocrine: Negative.   Musculoskeletal: Negative.   Neurological: Negative.   Hematological: Negative.   Psychiatric/Behavioral: Negative.    All other systems reviewed and are negative.    Objective: Vital Signs: There were no vitals taken  for this visit.  Physical Exam Vitals and nursing note reviewed.  Constitutional:      Appearance: She is well-developed.  HENT:     Head: Atraumatic.     Nose: Nose normal.  Eyes:     Extraocular Movements: Extraocular movements intact.  Cardiovascular:     Pulses: Normal pulses.  Pulmonary:     Effort: Pulmonary effort is normal.  Abdominal:     Palpations: Abdomen is soft.  Musculoskeletal:     Cervical back: Neck supple.  Skin:    General: Skin is warm.     Capillary Refill: Capillary refill takes less than 2 seconds.  Neurological:     Mental Status: She is alert. Mental status is at baseline.  Psychiatric:        Behavior: Behavior normal.        Thought Content: Thought content normal.        Judgment: Judgment normal.     Ortho Exam Examination of the left hip shows groin pain with FADIR.  No trochanteric tenderness. Specialty Comments:  No specialty comments available.  Imaging: XR HIP UNILAT W OR W/O PELVIS 2-3 VIEWS LEFT Result Date: 12/24/2023 X-rays of the pelvis show no acute or structural abnormalities.  There is degenerative changes of bilateral hips.    PMFS History: Patient Active Problem List   Diagnosis Date Noted   Flu-like symptoms 12/11/2023   OSA (obstructive sleep apnea) 09/19/2023   Primary osteoarthritis of right hip 09/12/2023   Screening mammogram for breast cancer 09/10/2023   Allergic contact dermatitis due to drugs in contact with skin 07/25/2022   Primary osteoarthritis of left hip 12/07/2021   Primary osteoarthritis of both knees 12/07/2021   Hyperthyroidism 10/19/2021   Encounter for general adult medical examination with abnormal findings 10/05/2021   Screen for colon cancer 10/05/2021   GERD (gastroesophageal reflux disease)    Binge-eating disorder, moderate 08/18/2020   Migraine without aura and without status migrainosus, not intractable 08/18/2020   Seasonal allergic rhinitis due to pollen 04/12/2020   Graves disease  10/12/2019   OAB (overactive bladder) 04/08/2019   Depression with anxiety 04/08/2019   Visit for screening mammogram 03/27/2017   Eczema, allergic 03/08/2016   Essential hypertension, benign 02/24/2014   Morbid obesity (HCC) 11/01/2010   Hypersomnia with sleep apnea 10/16/2010   Past Medical History:  Diagnosis Date   Asthma    GERD (gastroesophageal reflux disease)    Hypersomnia with sleep apnea    Hypertension    Migraine    Prediabetes    Rhinitis     Family History  Problem Relation Age of Onset   Diabetes Mother    COPD Mother    Rheum arthritis Mother    Diabetes Father    Early death Father    Hyperlipidemia Brother    Thyroid disease Brother    Alcohol abuse Neg Hx    Cancer Neg Hx    Depression Neg Hx    Drug abuse Neg Hx    Hearing loss Neg Hx    Heart disease Neg Hx    Hypertension Neg Hx    Stroke Neg Hx    Breast cancer Neg Hx     Past Surgical History:  Procedure Laterality Date   CHOLECYSTECTOMY     TOOTH EXTRACTION     Social History   Occupational History   Occupation: [employer]    Employer: [employer]  Tobacco Use   Smoking status: Former    Current packs/day: 0.00    Average packs/day: 0.1 packs/day for 1 year (0.1 ttl pk-yrs)    Types: Cigarettes    Start date: 10/08/1998    Quit date: 10/09/1999    Years since quitting: 24.2   Smokeless tobacco: Never  Vaping Use   Vaping status: Never Used  Substance and Sexual Activity   Alcohol use: Yes    Comment: 1 glass of white wine monthly   Drug use: No   Sexual activity: Yes    Birth control/protection: Pill, OCP

## 2023-12-26 ENCOUNTER — Other Ambulatory Visit: Payer: Self-pay | Admitting: Internal Medicine

## 2023-12-26 DIAGNOSIS — K2100 Gastro-esophageal reflux disease with esophagitis, without bleeding: Secondary | ICD-10-CM

## 2023-12-31 ENCOUNTER — Encounter: Payer: Self-pay | Admitting: Internal Medicine

## 2024-01-01 ENCOUNTER — Encounter: Admitting: Sports Medicine

## 2024-01-06 ENCOUNTER — Encounter: Payer: Self-pay | Admitting: Internal Medicine

## 2024-01-09 ENCOUNTER — Other Ambulatory Visit: Payer: Self-pay | Admitting: Internal Medicine

## 2024-01-09 ENCOUNTER — Encounter (INDEPENDENT_AMBULATORY_CARE_PROVIDER_SITE_OTHER): Payer: Self-pay

## 2024-01-09 ENCOUNTER — Encounter: Payer: Self-pay | Admitting: Internal Medicine

## 2024-01-10 ENCOUNTER — Encounter: Payer: Self-pay | Admitting: Internal Medicine

## 2024-01-10 ENCOUNTER — Ambulatory Visit: Admitting: Family Medicine

## 2024-01-10 ENCOUNTER — Encounter: Payer: Self-pay | Admitting: Family Medicine

## 2024-01-10 VITALS — BP 128/86 | HR 90 | Temp 98.2°F | Ht 65.5 in | Wt 337.8 lb

## 2024-01-10 DIAGNOSIS — J208 Acute bronchitis due to other specified organisms: Secondary | ICD-10-CM

## 2024-01-10 DIAGNOSIS — R053 Chronic cough: Secondary | ICD-10-CM

## 2024-01-10 DIAGNOSIS — R062 Wheezing: Secondary | ICD-10-CM

## 2024-01-10 MED ORDER — HYDROCODONE BIT-HOMATROP MBR 5-1.5 MG/5ML PO SOLN
5.0000 mL | Freq: Three times a day (TID) | ORAL | 0 refills | Status: DC | PRN
Start: 2024-01-10 — End: 2024-04-22

## 2024-01-10 MED ORDER — PREDNISONE 20 MG PO TABS: 40.0000 mg | ORAL_TABLET | Freq: Every day | ORAL | 0 refills | Status: AC

## 2024-01-10 MED ORDER — ALBUTEROL SULFATE HFA 108 (90 BASE) MCG/ACT IN AERS: 2.0000 | INHALATION_SPRAY | RESPIRATORY_TRACT | 1 refills | Status: AC | PRN

## 2024-01-10 NOTE — Progress Notes (Signed)
 Acute Office Visit  Subjective:     Patient ID: Alicia Hoover, female    DOB: September 20, 1976, 48 y.o.   MRN: [id number]  Chief Complaint  Patient presents with   Acute Visit    Cough post covid, is on second bottle of cough syrup, mostly dry productive at times no color to mucus.    HPI Patient is in today for evaluation of continued dry cough post-COVID. Reports she had COVID on 12/19/2023. Has been using Singulair, Xyzal, promethazine dextromethorphan cough syrup at night, Mucinex during the day with very little relief. Reports cough is worse at night, wheezing at night. Uses a CPAP, has not been able to use due to coughing. Does not have an inhaler, has used one in the past. Denies abdominal pain, nausea, vomiting, diarrhea, rash, fever, other symptoms. Denies other concerns today. Medical history as outlined below.  ROS Per HPI      Objective:    BP 128/86 (BP Location: Left Arm, Patient Position: Sitting)   Pulse 90   Temp 98.2 F (36.8 C) (Temporal)   Ht 5' 5.5" (1.664 m)   Wt (!) 337 lb 12.8 oz (153.2 kg)   SpO2 98%   BMI 55.36 kg/m    Physical Exam Vitals and nursing note reviewed.  Constitutional:      General: She is not in acute distress.    Appearance: Normal appearance. She is obese.  HENT:     Head: Normocephalic and atraumatic.     Right Ear: External ear normal. There is impacted cerumen.     Left Ear: External ear normal. There is impacted cerumen.     Nose: Nose normal.     Mouth/Throat:     Mouth: Mucous membranes are moist.     Pharynx: Oropharynx is clear.  Eyes:     Extraocular Movements: Extraocular movements intact.     Pupils: Pupils are equal, round, and reactive to light.  Cardiovascular:     Rate and Rhythm: Normal rate and regular rhythm.     Pulses: Normal pulses.     Heart sounds: Normal heart sounds.  Pulmonary:     Effort: Pulmonary effort is normal. No respiratory distress.     Breath sounds: Examination of the  right-lower field reveals wheezing. Examination of the left-lower field reveals wheezing. Wheezing present. No rhonchi or rales.     Comments: Persistent dry cough Musculoskeletal:        General: Normal range of motion.     Cervical back: Normal range of motion.  Lymphadenopathy:     Cervical: No cervical adenopathy.  Neurological:     General: No focal deficit present.     Mental Status: She is alert and oriented to person, place, and time.  Psychiatric:        Mood and Affect: Mood normal.        Thought Content: Thought content normal.     No results found for any visits on 01/10/24.      Assessment & Plan:   Acute bronchitis due to other specified organisms -     predniSONE; Take 2 tablets (40 mg total) by mouth daily for 5 days.  Dispense: 10 tablet; Refill: 0  Wheezing -     Albuterol Sulfate HFA; Inhale 2 puffs into the lungs every 4 (four) hours as needed for wheezing or shortness of breath.  Dispense: 18 g; Refill: 1  Persistent cough -     HYDROcodone Bit-Homatrop MBr; Take 5 mLs by  mouth every 8 (eight) hours as needed for cough.  Dispense: 120 mL; Refill: 0  Discussed sedation precautions due to hydrocodone cough syrup. Discussed scheduling albuterol inhaler 3 times a day through the next couple of days to help keep airways open. Discussed that if this is not resolving symptoms, the next step would be the prednisone. Patient states she will try albuterol and cough syrup first.   Meds ordered this encounter  Medications   predniSONE (DELTASONE) 20 MG tablet    Sig: Take 2 tablets (40 mg total) by mouth daily for 5 days.    Dispense:  10 tablet    Refill:  0   albuterol (VENTOLIN HFA) 108 (90 Base) MCG/ACT inhaler    Sig: Inhale 2 puffs into the lungs every 4 (four) hours as needed for wheezing or shortness of breath.    Dispense:  18 g    Refill:  1   HYDROcodone bit-homatropine (HYCODAN) 5-1.5 MG/5ML syrup    Sig: Take 5 mLs by mouth every 8 (eight) hours  as needed for cough.    Dispense:  120 mL    Refill:  0    Return if symptoms worsen or fail to improve.  Sherald Barge, FNP

## 2024-01-10 NOTE — Patient Instructions (Signed)
 I have sent in an albuterol inhaler for you to use 2 puffs every 4 hours as needed for wheezing.  I have sent in hydrocodone cough syrup for you to take 5 mL once daily in the evening as needed for cough.  This medication may make you sleepy.  Do not drive or operate heavy machinery while taking this medication.  I have sent in prednisone for you to take 2 tablets once daily in the morning with breakfast for the next 5 days.  Follow-up with me for new or worsening symptoms.

## 2024-01-27 ENCOUNTER — Other Ambulatory Visit (HOSPITAL_COMMUNITY)
Admission: RE | Admit: 2024-01-27 | Discharge: 2024-01-27 | Disposition: A | Discharge status: Home / Self Care | Source: Ambulatory Visit | Attending: Internal Medicine | Admitting: Internal Medicine

## 2024-01-27 ENCOUNTER — Ambulatory Visit
Admission: RE | Admit: 2024-01-27 | Discharge: 2024-01-27 | Disposition: A | Discharge status: Home / Self Care | Source: Ambulatory Visit | Attending: Internal Medicine | Admitting: Internal Medicine

## 2024-01-27 DIAGNOSIS — E041 Nontoxic single thyroid nodule: Secondary | ICD-10-CM | POA: Insufficient documentation

## 2024-01-29 ENCOUNTER — Encounter: Payer: Self-pay | Admitting: Internal Medicine

## 2024-01-29 LAB — CYTOLOGY - NON PAP

## 2024-01-31 IMAGING — MR MR HIP*L* W/O CM
5 of 6 series · 30 of 40 positions shown · non-contrast
Comparison: Radiographs 12/07/2021

CLINICAL DATA: Left hip pain.

EXAM:
MR OF THE LEFT HIP WITHOUT CONTRAST
TECHNIQUE: Multiplanar, multisequence MR imaging was performed. No intravenous
contrast was administered.

[Series 4: T2 fat-sat · coronal · 4.0mm · 0.78mm/px · 6 of 30 slices shown (1 of 3)]
[im 1/30]
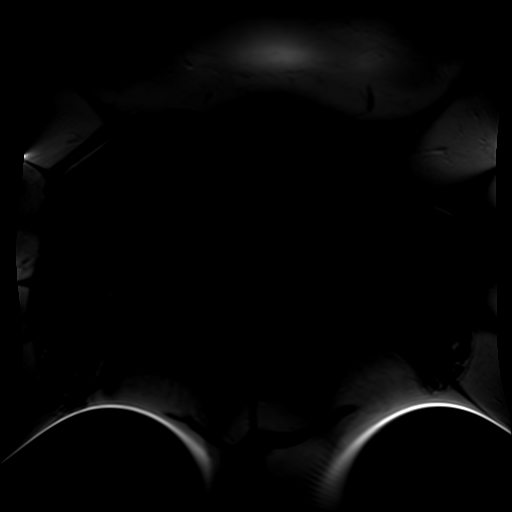
[im 6/30]
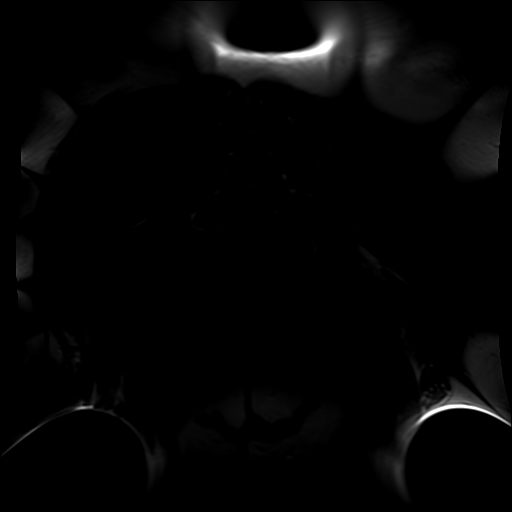
[im 12/30]
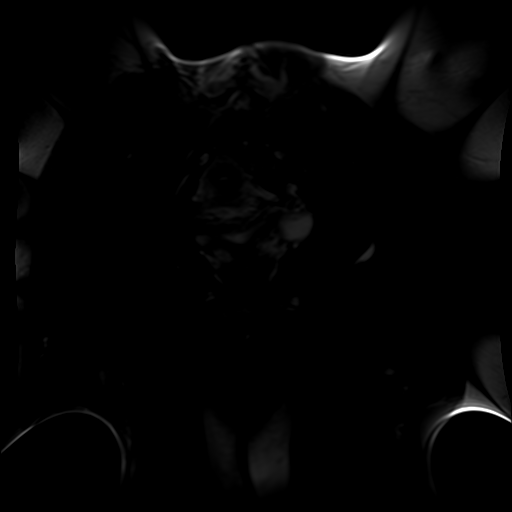
[im 18/30]
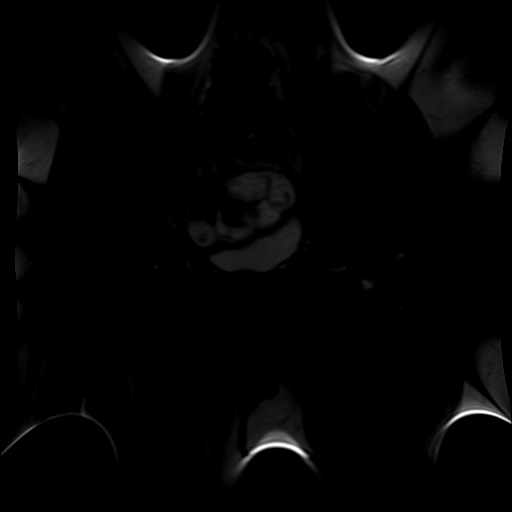
[im 24/30]
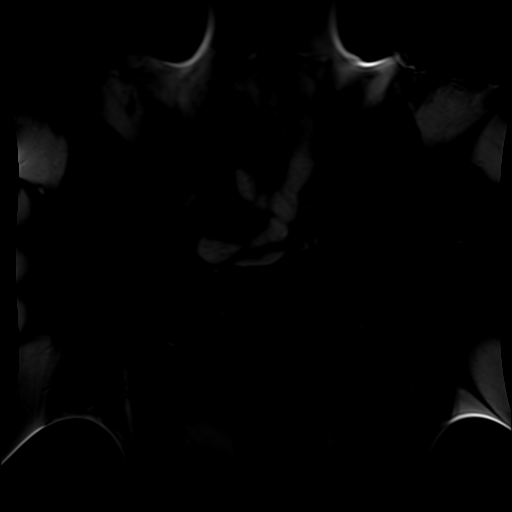
[im 30/30]
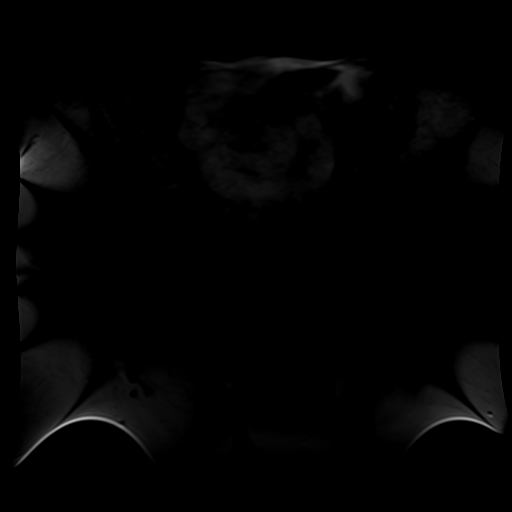

[Series 5: T2 fat-sat · axial · 4.0mm · 0.78mm/px · z∈[-85,+120]mm · 9 of 42 slices shown (2 of 3)]
[im 1/42]
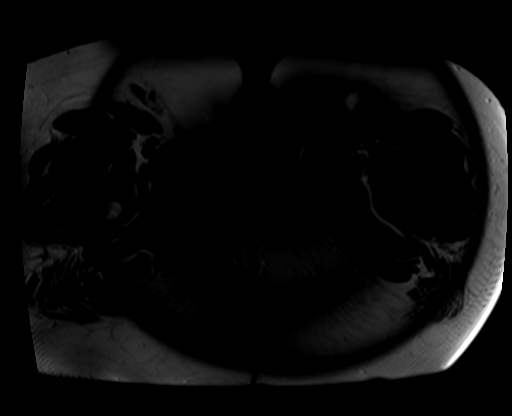
[im 6/42]
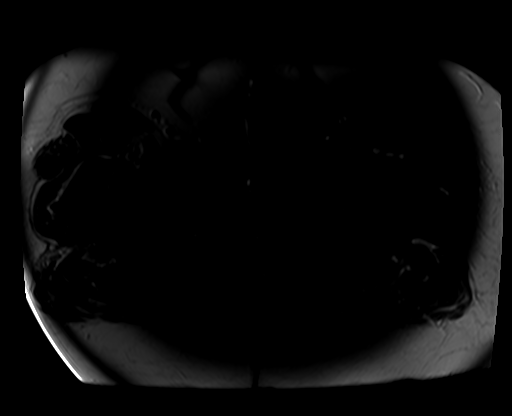
[im 11/42]
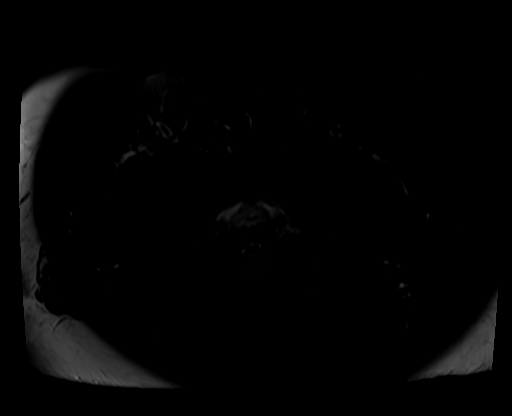
[im 16/42]
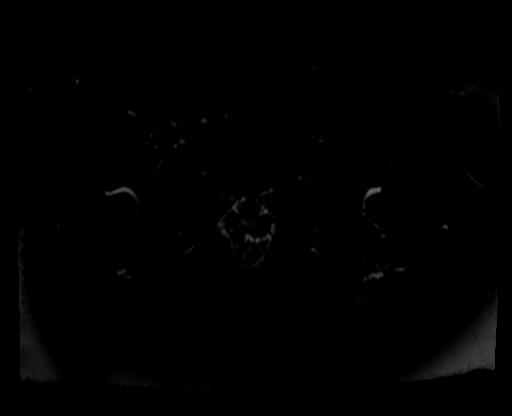
[im 21/42]
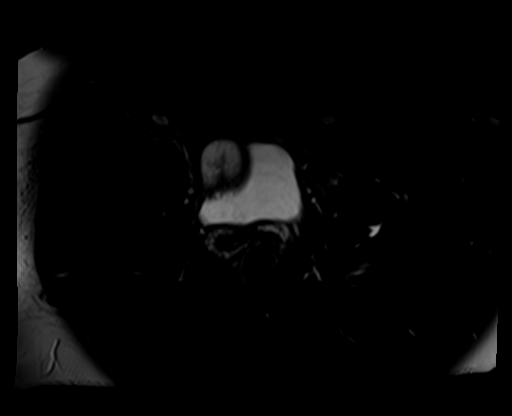
[im 26/42]
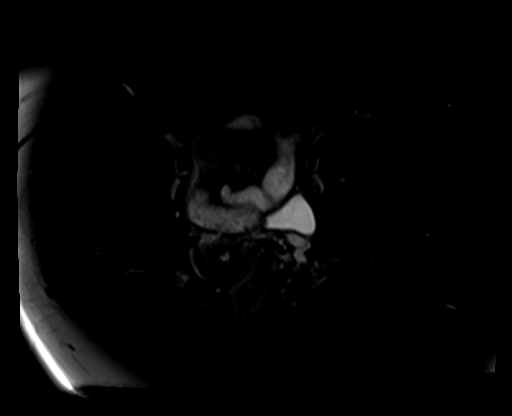
[im 31/42]
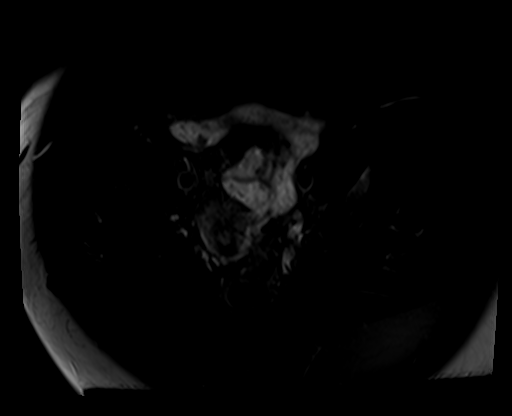
[im 36/42]
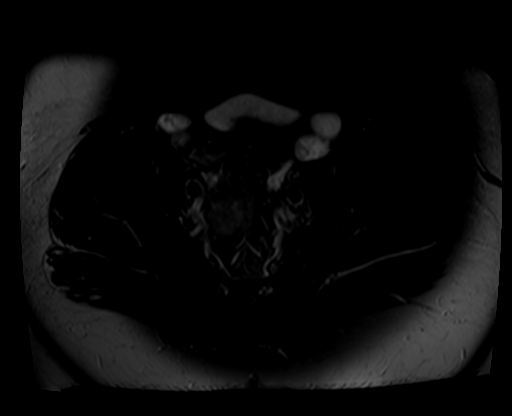
[im 42/42]
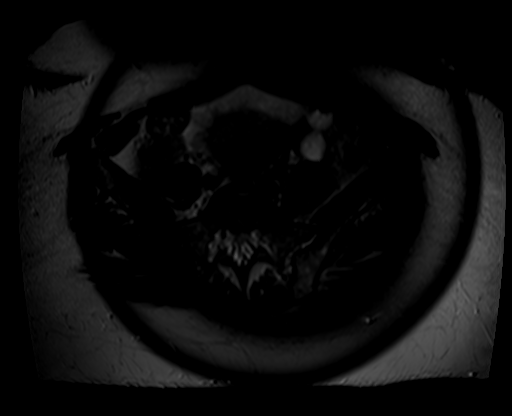

[Series 6: PD fat-sat · sagittal · 4.0mm · 0.78mm/px · 7 of 35 slices shown (1 of 2)]
[im 1/35]
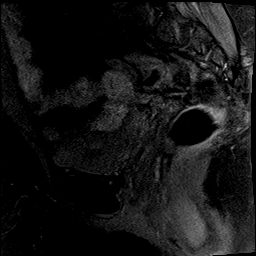
[im 6/35]
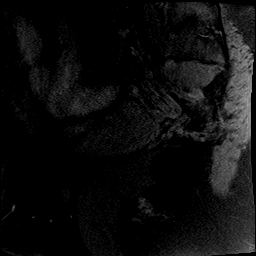
[im 12/35]
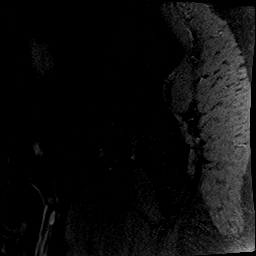
[im 18/35]
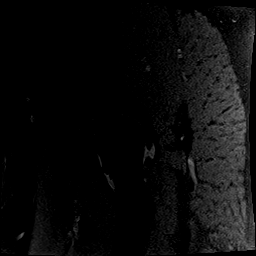
[im 23/35]
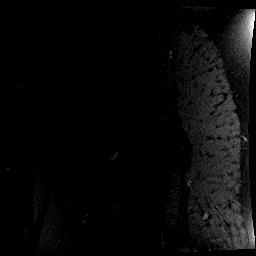
[im 29/35]
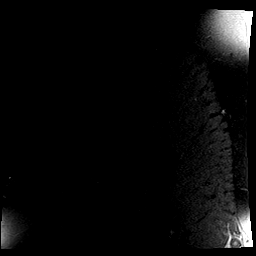
[im 35/35]
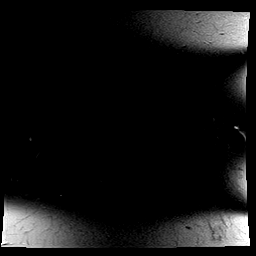

[Series 7: PD fat-sat · coronal · 4.0mm · 0.78mm/px · 6 of 29 slices shown (2 of 2)]
[im 1/29]
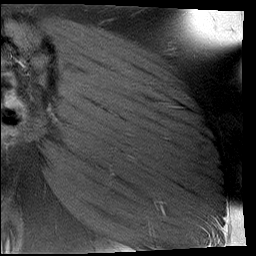
[im 6/29]
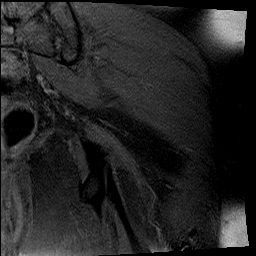
[im 12/29]
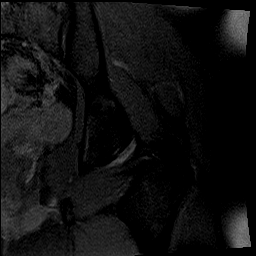
[im 17/29]
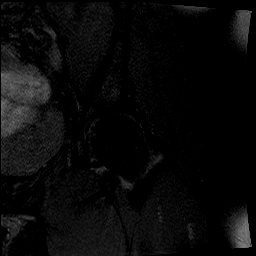
[im 23/29]
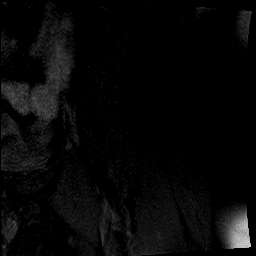
[im 29/29]
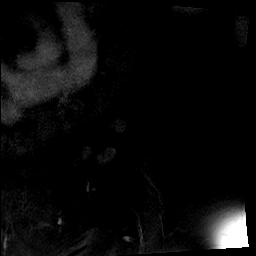

[Series 8: T2 fat-sat · axial · 4.0mm · 0.39mm/px · z∈[-47,-17]mm · 2 of 26 slices shown (3 of 3)]
[im 1/26]
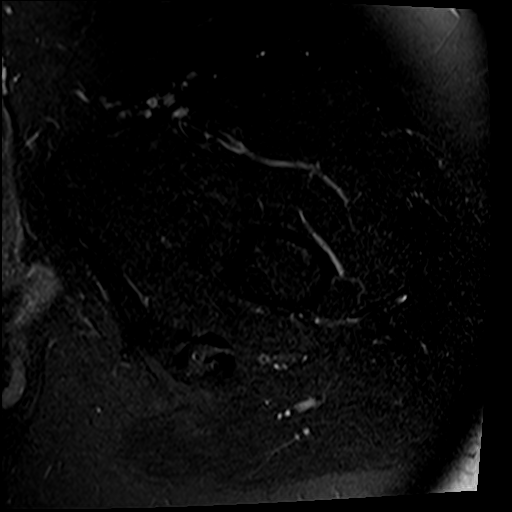
[im 7/26]
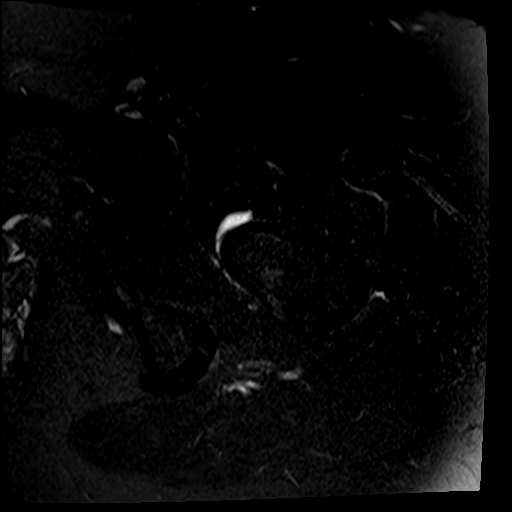

[30 of 40 positions shown; findings below may reference images not displayed]

FINDINGS: Both hips are normally located. Moderate degenerative changes
bilaterally with degenerative chondrosis, mild joint space narrowing
and early spurring. No stress fracture or AVN involving the hips.

However, there is abnormal edema like signal abnormality in the left
acetabulum suspicious for stress reaction or subtle stress fracture.

No hip joint effusion or periarticular fluid collections to suggest
a paralabral cyst. No definite labral tears.

The hip and pelvic musculature is intact. Mild peritendinitis but no
trochanteric bursitis. No muscle tear, myositis or mass. The
hamstring tendons are intact.

No significant intrapelvic abnormalities are identified.
IMPRESSION: 1. Moderate bilateral hip joint degenerative changes.
2. Edema like signal abnormality in the left acetabulum suspicious
for stress reaction or subtle stress fracture.
3. Intact bony pelvis.
4. Mild peritendinitis but no trochanteric bursitis.

## 2024-02-18 ENCOUNTER — Ambulatory Visit (INDEPENDENT_AMBULATORY_CARE_PROVIDER_SITE_OTHER): Admitting: Family Medicine

## 2024-02-18 ENCOUNTER — Encounter (INDEPENDENT_AMBULATORY_CARE_PROVIDER_SITE_OTHER): Payer: Self-pay | Admitting: Family Medicine

## 2024-02-18 VITALS — BP 127/78 | HR 65 | Temp 97.8°F | Ht 65.0 in | Wt 333.0 lb

## 2024-02-18 DIAGNOSIS — I10 Essential (primary) hypertension: Secondary | ICD-10-CM | POA: Diagnosis not present

## 2024-02-18 DIAGNOSIS — Z6841 Body Mass Index (BMI) 40.0 and over, adult: Secondary | ICD-10-CM | POA: Diagnosis not present

## 2024-02-18 DIAGNOSIS — Z0289 Encounter for other administrative examinations: Secondary | ICD-10-CM

## 2024-02-18 DIAGNOSIS — G4733 Obstructive sleep apnea (adult) (pediatric): Secondary | ICD-10-CM

## 2024-02-18 NOTE — Progress Notes (Signed)
 Rae Bugler, DO, ABFM, ABOM Bariatric physician [address] [place], [place], [place] [address] Office: [phone]  /  Fax: [phone]     Initial Evaluation:  Alicia Hoover was seen in clinic today to evaluate for obesity. Alicia Hoover is interested in losing weight to improve overall health and reduce the risk of weight related complications. Alicia Hoover presents today to review program treatment options, initial physical assessment, and evaluation.      Alicia Hoover was referred by: PCP  When asked how has your weight affected you? Alicia Hoover states: Contributed to medical problems, Having fatigue, and Problems with eating patterns. Alicia Hoover adds, "I have no feedback to tell me I'm full."  Contributing factors to her weight change: Family history of obesity, Consumption of processed foods, Reduced physical activity, Eating patterns, and Life event (loss of her husband in 2013).   Some associated conditions: Hypertension, Arthritis:hip, OSA, Prediabetes, and Overactive bladder  Current nutrition plan: Portion control / smart choices  Current level of physical activity: Walking 30 minutes, twice a week  Current or previous pharmacotherapy: GLP-1 (Ozempic ), Phentermine  (used 3+ yrs ago with no clinical effect/wt loss), and Vyvanse  (for Binge Eating Disorder, which did help with curving appetite along with Ozempic ).  Response to medication: lost weight initially but regained. With Vyvanse /Ozempic  Alicia Hoover lost 35 lbs in 1 yr but regained once Alicia Hoover had to stop, due to insurance discontinuing coverage and high self-pay costs.    Barriers to weight loss that patient expresses a concern about today: multiple competing priorities, low volume of physical activity at present , and "I have no feedback to tell me I'm full.".   When asked what Alicia Hoover hopes to accomplish, Alicia Hoover states: Improve existing medical conditions, reduce number of medications, reduce risk for a surgery, and improve quality of life.    Past Medical History:   Diagnosis Date   Asthma    GERD (gastroesophageal reflux disease)    Hypersomnia with sleep apnea    Hypertension    Migraine    Prediabetes    Rhinitis     Current Outpatient Medications  Medication Instructions   albuterol  (VENTOLIN  HFA) 108 (90 Base) MCG/ACT inhaler 2 puffs, Inhalation, Every 4 hours PRN   diclofenac  (VOLTAREN ) 75 mg, Oral, 2 times daily   fluocinonide -emollient (LIDEX -E) 0.05 % cream APPLY TO AFFECTED AREA TWICE A DAY   fluticasone (FLONASE) 50 MCG/ACT nasal spray 2 sprays, As needed   HYDROcodone  bit-homatropine (HYCODAN) 5-1.5 MG/5ML syrup 5 mLs, Oral, Every 8 hours PRN   ibuprofen  (ADVIL ) 800 mg, Oral, Every 8 hours PRN   Insulin  Pen Needle 32G X 6 MM MISC 1 Act, Does not apply, Weekly   levocetirizine (XYZAL ) 5 MG tablet Every evening   methimazole  (TAPAZOLE ) 5 mg, Oral, Daily   montelukast  (SINGULAIR ) 10 MG tablet TAKE 1 TABLET BY MOUTH EVERYDAY AT BEDTIME   Multiple Vitamin (MULTIVITAMIN) tablet 1 tablet, Daily   norgestimate -ethinyl estradiol  (ORTHO-CYCLEN) 0.25-35 MG-MCG tablet 1 tablet, Oral, Daily   nystatin -triamcinolone  ointment (MYCOLOG) 1 Application, Topical, As needed   ondansetron  (ZOFRAN -ODT) 4 mg, As needed   pantoprazole  (PROTONIX ) 40 mg, Oral, 2 times daily   promethazine -dextromethorphan (PROMETHAZINE -DM) 6.25-15 MG/5ML syrup 5 mLs, Oral, 4 times daily PRN   promethazine -dextromethorphan (PROMETHAZINE -DM) 6.25-15 MG/5ML syrup 5 mLs, Oral, 4 times daily PRN   triamterene -hydrochlorothiazide  (DYAZIDE) 37.5-25 MG capsule 1 capsule, Oral, Daily   Trospium  Chloride 60 mg, Oral, Daily     Allergies  Allergen Reactions   Codeine Hives and Nausea And Vomiting  Tramadol  Diarrhea and Nausea And Vomiting     Past Surgical History:  Procedure Laterality Date   CHOLECYSTECTOMY     TOOTH EXTRACTION       Family History  Problem Relation Age of Onset   Diabetes Mother    COPD Mother    Rheum arthritis Mother    Diabetes Father    Early  death Father    Hyperlipidemia Brother    Thyroid  disease Brother    Alcohol abuse Neg Hx    Cancer Neg Hx    Depression Neg Hx    Drug abuse Neg Hx    Hearing loss Neg Hx    Heart disease Neg Hx    Hypertension Neg Hx    Stroke Neg Hx    Breast cancer Neg Hx      Objective:  BP 127/78   Pulse 65   Temp 97.8 F (36.6 C)   Ht 5\' 5"  (1.651 m)   Wt (!) 333 lb (151 kg)   LMP 01/21/2024   SpO2 98%   BMI 55.41 kg/m  Alicia Hoover was weighed on the bioimpedance scale: Body mass index is 55.41 kg/m.  Visceral Fat %: 21  , Body Fat %: 51.9   Vitals Temp: 97.8 F (36.6 C) BP: 127/78 Pulse Rate: 65 SpO2: 98 %   Anthropometric Measurements Height: 5\' 5"  (1.651 m) Weight: (!) 333 lb (151 kg) BMI (Calculated): 55.41 Weight at Last Visit: NA Weight Lost Since Last Visit: NA Weight Gained Since Last Visit: NA Starting Weight: NA Total Weight Loss (lbs):  (0 kg) (NA) Peak Weight: 330lb Waist Measurement : 0 inches (NA)   Body Composition  Body Fat %: 51.9 % Fat Mass (lbs): 172.8 lbs Muscle Mass (lbs): 152.2 lbs Total Body Water (lbs): 113.8 lbs Visceral Fat Rating : 21   Other Clinical Data A1c: 0 mg/dL (NA) RMR: 0 (NA) Fasting: No Labs: No Today's Visit #: Info Session Starting Date:  (NA) Comments: Info Session     General: Well Developed, well nourished, and in no acute distress.  HEENT: Normocephalic, atraumatic; EOMI, sclerae are anicteric. Skin: Warm and dry, good turgor Chest:  Normal excursion, shape, no gross ABN Respiratory: No conversational dyspnea; speaking in full sentences NeuroM-Sk:  Normal gross ROM * 4 extremities  Psych: A and O *3, insight adequate, mood- full    Assessment and Plan:   FOR THE DISEASE OF OBESITY: BMI 50.0-59.9, adult (HCC) -- Current BMI 55.41 Morbid obesity (HCC) Assessment & Plan: We reviewed anthropometrics, biometrics, associated medical conditions and contributing factors with patient. Alicia Hoover would benefit from a  medically tailored reduced calorie nutrional plan based on his REE (resting energy expenditure), which will be determined by indirect calorimetry.  We will also assess for cardiometabolic risk and nutritional derangements via fasting labs at intake appointment.    Obesity Treatment / Action Plan:  Alicia Hoover was weighed on the bioimpedance scale and results were discussed and documented in the synopsis.   Verla Glaze will complete provided nutritional and psychosocial assessment questionnaire before the next appointment.  Alicia Hoover will be scheduled for indirect calorimetry to determine resting energy expenditure in a fasting state.  This will allow us  to create a reduced calorie, high-protein meal plan to promote loss of fat mass while preserving muscle mass.  We will also assess for cardiometabolic risk and nutritional derangements via an ECG and fasting serologies at her next appointment.  Alicia Hoover was encouraged to work on amassing support from family and friends  to begin their weight loss journey.   Work on eliminating or reducing the presence of highly processed, poorly nutritious, calorie-dense foods in the home.   Obesity Education Performed Today:  Patient was counseled on nutritional approaches to weight loss and benefits of reducing processed foods and consuming plant-based foods and high quality protein as part of nutritional weight management program.   We discussed the importance of long term lifestyle changes which include nutrition, exercise and behavioral modifications as well as the importance of customizing this to her specific health and social needs.   We discussed the benefits of reaching a healthier weight to alleviate the symptoms of existing conditions and reduce the risks of the biomechanical, metabolic and psychological effects of obesity.  Was counseled on the health benefits of losing 5%-10% of total body weight.  Was counseled on our cognitive behavorial therapy program,  lead by our bariatric psychologist, who focuses on emotional eating and creating positive behavorial change.  Was counseled on bariatric pharmacotherapy and how this may be used as an adjunct in their weight management   Alicia Hoover  appears to be in the action stage of change and states they are ready to start intensive lifestyle modifications and behavioral modifications.  It was recommended that Alicia Hoover follow up in the next 1-2 weeks to review the above steps, and to continue with treatment of their chronic disease state of obesity   FOR OTHER CONDITIONS RELATED TO THE DISEASE OF OBESITY: Essential hypertension, benign Assessment & Plan: Diagnosed in 2013. Pt is currently compliant with Dyazide 37.5-25 mg once daily. Good compliance and tolerance. No SE reported. Has previously been on Spironolactone  and HCTZ prior to her current antihypertensive treatment.   Continue with current regimen. Reviewed how her condition may improve with healthy eating habits and other lifestyle changes. Will continue to monitor condition should pt enroll in the program. Advised pt to continue following up with her PCP as instructed by them.   OSA (obstructive sleep apnea) Assessment & Plan: Pt was diagnosed with sleep apnea 10+ years ago. Alicia Hoover had a home sleep study test around 11/12/23, which showed a AHI of 18.9 per hour with drops in her O2 sat. Alicia Hoover recently received a new CPAP with new settings and is compliant nightly. Follows up with Dr. [employer] of [facility]; next follow up is scheduled 03/20/24.  Discussed with pt how weight loss and healthy lifestyle changes may improve her overall condition. Encouraged pt to continue compliance with CPAP. Follow up with sleep specialists at her next appointment and as instructed by them in the future. Will continue to monitor her condition in the future, if pt desires to enroll in the program.   Attestations:   I, Bernita Bristle, acting as a medical scribe for Marceil Sensor, DO., have compiled all relevant documentation for today's office visit on behalf of Marceil Sensor, DO, while in the presence of [employer], DO.  Reviewed by clinician on day of visit: allergies, medications, problem list, medical history, surgical history, family history, social history, and previous encounter notes pertinent to obesity diagnosis.    I have spent 44 minutes in the care of the patient today. Specifically: 5 minutes was spent reviewing the chart before and after the visit. 39 minutes was spent counseling patient on the disease of obesity and what our program can do for their medical conditions as well as in preventing future diseases. I discussed the importance of comprehensive care in the treatment of obesity including mental well being  and physical activity.   I have reviewed the above documentation for accuracy and completeness, and I agree with the above. Rae Bugler, D.O.  The 21st Century Cures Act was signed into law in 2016 which includes the topic of electronic health records.  This provides immediate access to information in MyChart.  This includes consultation notes, operative notes, office notes, lab results and pathology reports.  If you have any questions about what you read please let us  know at your next visit so we can discuss your concerns and take corrective action if need be.  We are right here with you!

## 2024-03-17 ENCOUNTER — Encounter: Payer: Self-pay | Admitting: Internal Medicine

## 2024-03-17 ENCOUNTER — Ambulatory Visit: Payer: BC Managed Care – PPO | Admitting: Internal Medicine

## 2024-03-17 VITALS — BP 124/72 | HR 76 | Ht 65.0 in | Wt 328.0 lb

## 2024-03-17 DIAGNOSIS — E041 Nontoxic single thyroid nodule: Secondary | ICD-10-CM | POA: Diagnosis not present

## 2024-03-17 DIAGNOSIS — E0500 Thyrotoxicosis with diffuse goiter without thyrotoxic crisis or storm: Secondary | ICD-10-CM | POA: Diagnosis not present

## 2024-03-17 DIAGNOSIS — E0590 Thyrotoxicosis, unspecified without thyrotoxic crisis or storm: Secondary | ICD-10-CM | POA: Diagnosis not present

## 2024-03-17 LAB — TSH: TSH: 1.21 m[IU]/L

## 2024-03-17 LAB — T3, FREE: T3, Free: 2.8 pg/mL (ref 2.3–4.2)

## 2024-03-17 LAB — T4, FREE: Free T4: 1.2 ng/dL (ref 0.8–1.8)

## 2024-03-17 NOTE — Progress Notes (Unsigned)
 Name: Alicia Hoover  MRN/ DOB: [id number], 1975/10/19    Age/ Sex: 48 y.o., female     PCP: Arcadio Knuckles, MD   Reason for Endocrinology Evaluation: Hyperthyroidism     Initial Endocrinology Clinic Visit: 07/10/2019    PATIENT IDENTIFIER: [name] is a 48 y.o., female with a past medical history of HTN, Obesity and Depression. She has followed with [facility] since 07/10/2019 for consultative assistance with management of her hyperthyroidism  HISTORICAL SUMMARY: The patient was first diagnosed with subclinical hyperthyroidism in 04/2019 with a TSh of 0.31 uIU/mL . She was started on Methimazole  04/2019 TRAb elevated at 14.60 IU/L  No Amiodarone  Was on Biotin until 05/2019   No radiation exposure.   Brother with thyroid  disease   Thyroid  ultrasound revealed MNG 03/2023 with left inferior 3.3 cm nodule meeting FNA criteria.  She is s/p benign FNA of the left inferior nodule 01/2024   HYPOKALEMIA HISTORY: Pt has been noted with persistent and spontaneous hypokalemia since 08/2020 despite being on spironolactone .  Aldo was 10 mg/DL, renin was normal at 4.098 and normal Aldo/renin ratio-of note these were done while the patient on spironolactone  Patient did not submit 24-hour urinary cortisol   SUBJECTIVE:    Today (03/17/2024):  Alicia Hoover is here for a follow up on hyperthyroidism secondary to Graves' disease and multinodular goiter.  Weight has been trending down  Denies local neck swelling  Denies diarrhea or constipation  Denies eye symptoms  Denies palpitations  Denies tremors     Methimazole  5 mg, 6 days a week ( Skips Sunday )     HISTORY:  Past Medical History:  Past Medical History:  Diagnosis Date   Asthma    GERD (gastroesophageal reflux disease)    Hypersomnia with sleep apnea    Hypertension    Migraine    Prediabetes    Rhinitis    Past Surgical History:  Past Surgical History:  Procedure Laterality Date    CHOLECYSTECTOMY     TOOTH EXTRACTION     Social History:  reports that she quit smoking about 24 years ago. Her smoking use included cigarettes. She started smoking about 25 years ago. She has a 0.1 pack-year smoking history. She has never used smokeless tobacco. She reports current alcohol use. She reports that she does not use drugs. Family History:  Family History  Problem Relation Age of Onset   Diabetes Mother    COPD Mother    Rheum arthritis Mother    Diabetes Father    Early death Father    Hyperlipidemia Brother    Thyroid  disease Brother    Alcohol abuse Neg Hx    Cancer Neg Hx    Depression Neg Hx    Drug abuse Neg Hx    Hearing loss Neg Hx    Heart disease Neg Hx    Hypertension Neg Hx    Stroke Neg Hx    Breast cancer Neg Hx      HOME MEDICATIONS: Allergies as of 03/17/2024       Reactions   Codeine Hives, Nausea And Vomiting   Tramadol  Diarrhea, Nausea And Vomiting        Medication List        Accurate as of March 17, 2024  7:52 AM. If you have any questions, ask your nurse or doctor.          albuterol  108 (90 Base) MCG/ACT inhaler Commonly known as: VENTOLIN  HFA  Inhale 2 puffs into the lungs every 4 (four) hours as needed for wheezing or shortness of breath.   diclofenac  75 MG EC tablet Commonly known as: VOLTAREN  Take 1 tablet (75 mg total) by mouth 2 (two) times daily.   fluocinonide -emollient 0.05 % cream Commonly known as: LIDEX -E APPLY TO AFFECTED AREA TWICE A DAY What changed: See the new instructions.   fluticasone 50 MCG/ACT nasal spray Commonly known as: FLONASE Place 2 sprays into the nose as needed for allergies.   HYDROcodone  bit-homatropine 5-1.5 MG/5ML syrup Commonly known as: HYCODAN Take 5 mLs by mouth every 8 (eight) hours as needed for cough.   ibuprofen  800 MG tablet Commonly known as: ADVIL  TAKE 1 TABLET BY MOUTH EVERY 8 HOURS AS NEEDED   Insulin  Pen Needle 32G X 6 MM Misc 1 Act by Does not apply route once a  week.   levocetirizine 5 MG tablet Commonly known as: XYZAL  TAKE 1 TABLET BY MOUTH EVERY DAY IN THE EVENING   methimazole  5 MG tablet Commonly known as: TAPAZOLE  TAKE 1 TABLET (5 MG TOTAL) BY MOUTH DAILY.   montelukast  10 MG tablet Commonly known as: SINGULAIR  TAKE 1 TABLET BY MOUTH EVERYDAY AT BEDTIME   multivitamin tablet Take 1 tablet by mouth daily.   norgestimate -ethinyl estradiol  0.25-35 MG-MCG tablet Commonly known as: ORTHO-CYCLEN Take 1 tablet by mouth daily.   nystatin -triamcinolone  ointment Commonly known as: MYCOLOG Apply 1 Application topically as needed.   ondansetron  4 MG disintegrating tablet Commonly known as: ZOFRAN -ODT Take 4 mg by mouth as needed for nausea or vomiting.   pantoprazole  40 MG tablet Commonly known as: PROTONIX  TAKE 1 TABLET BY MOUTH TWICE A DAY   promethazine -dextromethorphan 6.25-15 MG/5ML syrup Commonly known as: PROMETHAZINE -DM Take 5 mLs by mouth 4 (four) times daily as needed.   promethazine -dextromethorphan 6.25-15 MG/5ML syrup Commonly known as: PROMETHAZINE -DM Take 5 mLs by mouth 4 (four) times daily as needed for cough.   triamterene -hydrochlorothiazide  37.5-25 MG capsule Commonly known as: DYAZIDE TAKE 1 EACH (1 CAPSULE TOTAL) BY MOUTH DAILY.   Trospium  Chloride 60 MG Cp24 Take 1 capsule (60 mg total) by mouth daily.          OBJECTIVE:   PHYSICAL EXAM: VS: BP 124/72 (BP Location: Left Arm, Patient Position: Sitting, Cuff Size: Normal)   Pulse 76   Ht 5\' 5"  (1.651 m)   Wt (!) 328 lb (148.8 kg)   LMP 01/21/2024   SpO2 98%   BMI 54.58 kg/m    EXAM: General: Pt appears well and is in NAD  Eyes: External eye exam normal without stare, lid lag or exophthalmos.  EOM intact.    Neck: General: Supple without adenopathy. Thyroid : Thyroid  size normal.  No goiter or nodules appreciated.  Lungs: Clear with good BS bilat   Heart: Auscultation: RRR.  Extremities: BL LE: Trace pretibial edema   Mental Status:  Judgment, insight: Intact Orientation: Oriented to time, place, and person Mood and affect: No depression, anxiety, or agitation     DATA REVIEWED:  Latest Reference Range & Units 09/10/23 15:32  TSH 0.35 - 5.50 uIU/mL 1.50    Latest Reference Range & Units 09/10/23 15:32  WBC 4.0 - 10.5 K/uL 10.5  RBC 3.87 - 5.11 Mil/uL 5.27 (H)  Hemoglobin 12.0 - 15.0 g/dL 86.5  HCT 78.4 - 69.6 % 41.5  MCV 78.0 - 100.0 fl 78.8  MCHC 30.0 - 36.0 g/dL 29.5  RDW 28.4 - 13.2 % 14.8  Platelets 150.0 - 400.0 K/uL  391.0  Neutrophils 43.0 - 77.0 % 62.8  Lymphocytes 12.0 - 46.0 % 26.9  Monocytes Relative 3.0 - 12.0 % 8.1  Eosinophil 0.0 - 5.0 % 1.8  Basophil 0.0 - 3.0 % 0.4  NEUT# 1.4 - 7.7 K/uL 6.6  Lymphs Abs 0.7 - 4.0 K/uL 2.8  Monocyte # 0.1 - 1.0 K/uL 0.9  Eosinophils Absolute 0.0 - 0.7 K/uL 0.2  Basophils Absolute 0.0 - 0.1 K/uL 0.0    Latest Reference Range & Units 09/10/23 15:32  Sodium 135 - 145 mEq/L 137  Potassium 3.5 - 5.1 mEq/L 3.8  Chloride 96 - 112 mEq/L 100  CO2 19 - 32 mEq/L 32  Glucose 70 - 99 mg/dL 92  BUN 6 - 23 mg/dL 16  Creatinine 1.47 - 8.29 mg/dL 5.62  Calcium 8.4 - 13.0 mg/dL 9.4  GFR >86.57 mL/min 74.57      Thyroid  ultrasound 04/02/2023   Estimated total number of nodules >/= 1 cm: 3   Number of spongiform nodules >/=  2 cm not described below (TR1): 0   Number of mixed cystic and solid nodules >/= 1.5 cm not described below (TR2): 0   _________________________________________________________   Nodule # 1:   Location: Isthmus; Mid   Maximum size: 2.4 cm; Other 2 dimensions: 2.0 x 2.0 cm   Composition: solid/almost completely solid (2)   Echogenicity: isoechoic (1)   Shape: not taller-than-wide (0)   Margins: ill-defined (0)   Echogenic foci: none (0)   ACR TI-RADS total points: 3.   ACR TI-RADS risk category: TR3 (3 points).   ACR TI-RADS recommendations:   *Given size (>/= 1.5 - 2.4 cm) and appearance, a follow-up ultrasound in 1 year  should be considered based on TI-RADS criteria.   _________________________________________________________   Nodule # 2:   Location: Right; Inferior   Maximum size: 1.7 cm; Other 2 dimensions: 1.6 x 1.5 cm   Composition: solid/almost completely solid (2)   Echogenicity: isoechoic (1)   Shape: not taller-than-wide (0)   Margins: ill-defined (0)   Echogenic foci: none (0)   ACR TI-RADS total points: 3.   ACR TI-RADS risk category: TR3 (3 points).   ACR TI-RADS recommendations:   *Given size (>/= 1.5 - 2.4 cm) and appearance, a follow-up ultrasound in 1 year should be considered based on TI-RADS criteria.   _________________________________________________________   Nodule # 3:   Location: Left; Inferior   Maximum size: 3.3 cm; Other 2 dimensions: 3.2 x 2.6 cm   Composition: solid/almost completely solid (2)   Echogenicity: isoechoic (1)   Shape: not taller-than-wide (0)   Margins: ill-defined (0)   Echogenic foci: none (0)   ACR TI-RADS total points: 3.   ACR TI-RADS risk category: TR3 (3 points).   ACR TI-RADS recommendations:   **Given size (>/= 2.5 cm) and appearance, fine needle aspiration of this mildly suspicious nodule should be considered based on TI-RADS criteria.   _________________________________________________________   No hypervascularity or regional adenopathy.   IMPRESSION: 3.3 cm left inferior thyroid  TR 3 nodule meets criteria for biopsy as above. This correlates with the chest CT finding.   Isthmus and right inferior thyroid  TR 3 nodules meet criteria for follow-up in 1 year.     FNA left inferior Nodule  01/27/2024   Clinical History: Nodule #3: Left; Inferior, Maximum size: 3.3 cm; Other  2 dimensions: 3.2 x 2.6 cm, solid/almost completely solid, isoechoic,  TI-RADS total points: 3.  Specimen Submitted:  A. THYROID , LT INFERIOR, FINE NEEDLE ASPIRATION  FINAL MICROSCOPIC DIAGNOSIS:  - Benign follicular nodule  (Bethesda category II)    Old records , labs and images have been reviewed.     ASSESSMENT / PLAN / RECOMMENDATIONS:   Hyperthyroidism Secondary to Graves' Disease:   - Clinically she is euthyroid  -She had opted to continue on Methimazole  vs  alternative options of treatment such as radioactive iodine ablation versus surgery - TFT's ***     Medications   Continue methimazole  5 mg  , 1 tablet 6 days a week     2. Graves' Disease:    - No extrathyroidal manifestations of Grave's disease.   3. Right thyroid  nodule :  -No local neck symptoms - S/p benign FNA of the left inferior nodule 01/2024 - Will repeat thyroid  ultrasound 2026    F/U in 6 months       Signed electronically by: Natale Bail, MD  [facility] Endocrinology  [facility] [address] [place] [place], [place] [address] Phone: [phone] FAX: [phone]      CC: Arcadio Knuckles, MD [address] [place] [address] Phone: [phone]  Fax: [phone]   Return to Endocrinology clinic as below: Future Appointments  Date Time Provider Department Center  03/20/2024  9:00 AM Faustina Hood, MD [facility] None  04/08/2024  9:40 AM Ladd Picker, MD [facility] None  04/22/2024  3:20 PM Ladd Picker, MD [facility] None

## 2024-03-19 ENCOUNTER — Ambulatory Visit: Payer: Self-pay | Admitting: Internal Medicine

## 2024-03-19 MED ORDER — METHIMAZOLE 5 MG PO TABS: 5.0000 mg | ORAL_TABLET | ORAL | 3 refills | Status: DC

## 2024-03-19 NOTE — Progress Notes (Signed)
 05/31/14- 38 yoF former smoker followed for OSA complicated by obesity, HBP, allergic rhinitis Hx of daytime sleepiness, loud snoring, headaches. NPSG 05/21/14- Mild OSA, AHI 9.4/ hr, desat to 81%, weight 313 lbs She fights daytime sleepiness and is looking forward to getting machine. We discussed sleep hygiene, alternatives to CPAP, importance of weight, and driving responsibility. She is working on weight.   ========================================================================== 09/19/23 47 yoF for sleep evalution with hx OSA, last seen by me in 2015. Medical problem list includes Allergic Rhinitis, Morbid Obesity, Depression, Hyperthyroid, GERD, HTN, Migraine, Body weight today-329 lbs Epworth score -24 NPSG 2003 reportedly qualified for CPAP but she dropped off NPSG 10/17/2010- AHI 0.9/ hr, weight 290 lbs, did not qualify for CPAP. NPSG 05/21/14- Mild OSA, AHI 9.4/ hr, desat to 81%, weight 313 lbs> Advanced> CPAP auto 5-20  Discussed the use of AI scribe software for clinical note transcription with the patient, who gave verbal consent to proceed. History of Present Illness   The patient, with a history of sleep apnea, presents for an updated sleep apnea machine. She has been using her current machine inconsistently. The last sleep study was conducted in 2015 and the patient is due for a new one. The patient denies needing any sleep aids and does not consume caffeine or energy drinks to stay awake. She denies any history of surgery in the nose or tonsils and denies any lung or heart trouble. 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 She also has hyperthyroidism, which is being managed by an endocrinologist. Daytime sleepiness is intrusive and family tell her of loud snoring.    Assessment and Plan:     Obstructive sleep apnea Severe daytime sleepiness. Options discussed for testing and treatment Plan- schedule home sleep test  Morbid Obesity Continue to work  on weight with diet/ exercise  12/19/23- 47 yoF for sleep evalution with hx OSA, complicated by Allergic Rhinitis, Morbid Obesity, Depression, Hyperthyroid, GERD, HTN, Migraine, HST 10/08/23- AHI 18.9/hr, desat to 72%, body weight 330 lbs CPAP auto 5-20/ [facility], new order 11/12/23 Body weight today- Download compliance- 60%, AHI 0.4/hr Download reviewed. She is working on compliance, but has been acutely ill. She will keep f/u appt in June. Cute visit today,initially reporting sick for 2 days, but stating PCP evaluated with neg COVI and Flu tests last week. Note: Tested positive at this visit for Covid Discussed the use of AI scribe software for clinical note transcription with the patient, who gave verbal consent to proceed. History of Present Illness   The patient, who works at a high school, presents with a two-day history of headache, nausea, diarrhea, and cough. She initially attributed her symptoms to spring pollen but now believes she may have caught a viral infection. She denies recent exposure to anyone sick outside of her work environment.  In addition to her acute symptoms, the patient has been using a CPAP machine for an OSA. She reports good compliance with the machine, although she has had some skipped nights due to her current illness. She reports no issues with the mask or pressure settings, but has had difficulty using the machine due to congestion from her current illness.  The patient also reports a history of cough, for which she has previously tried Promethazine  without significant relief. She has also tried Mucinex and throat lozenges for symptom management. She has a known allergy to tramadol  and codeine.    03/20/24- 48 yoF followed for OSA complicated by Allergic  Rhinitis, Morbid Obesity, Depression, Hyperthyroid, GERD, HTN, Migraine, HST 10/08/23- AHI 18.9/hr, desat to 72%, body weight 330 lbs CPAP auto 5-20/ [facility], new order 11/12/23 Body weight today-332 lbs Download  compliance- 17%, AHI 3.1/hr Compliance goals reviewed with her. Discussed the use of AI scribe software for clinical note transcription with the patient, who gave verbal consent to proceed.  History of Present Illness   Alicia Hoover is a 48 year old female with obstructive sleep apnea who presents with difficulty using CPAP due to nasal congestion.  Nasal congestion affects her ability to use the CPAP machine, leading to a sensation of suffocation at night. Congestion is primarily nasal without associated wheezing. Her treatment includes Flonase, Xyzal , and Singulair . She uses a nasal pillow mask with CPAP, which improves comfort and energy levels. She has an asthma inhaler for severe colds or sinus infections but does not use it regularly. No wheezing is present. She is receptive to Allergy referral . I don't see obvious polyps anteriorly. She can tell CPAP helps- sleeps better and doesn't wake with morning headache when she can use it.     Assessment and Plan:    Obstructive sleep apnea Obstructive sleep apnea well-managed with CPAP therapy at 14-15 cm H2O. Nasal pillows effective, reducing apneas to fewer than five per hour. Reduced fatigue and headaches noted. Nasal congestion from allergic rhinitis affects CPAP tolerance. - Aim for CPAP use at least four hours per night, six nights a week. - Optimize nasal function to improve CPAP tolerance.  Allergic rhinitis Chronic nasal congestion worsened by pollen exposure, especially Spring and [place]. Current treatment includes Xyzal , Singulair , and Flonase. Persistent symptoms impact CPAP use. - Refer to allergist for further evaluation and management. - Educated on daily use of Flonase for consistent symptom control.     ROS-see HPI Constitutional:   No-   weight loss, night sweats, fevers, chills, +fatigue, lassitude. HEENT:   + headaches, No-difficulty swallowing, +tooth/dental problems, sore throat,                  No- sneezing,  No-itching, ear ache, +nasal congestion, post nasal drip,  CV:  No-   chest pain, orthopnea, PND, swelling in lower extremities, anasarca,                                   dizziness, palpitations Resp: + shortness of breath with exertion or at rest.              No-   productive cough, + non-productive cough,  No- coughing up of blood.              No-   change in color of mucus.  No- wheezing.   Skin: No-   rash or lesions. GI:  +heartburn, indigestion, No-abdominal pain, nausea, vomiting, GU:  MS:  No-   joint pain or swelling.   Neuro-     nothing unusual Psych:  No- change in mood or affect. + depression +anxiety.  No memory loss.   OBJ- Physical Exam General- Alert, Oriented, Affect-appropriate, Distress- none acute, +obese,  Skin- rash-none, lesions- none, excoriation- none Lymphadenopathy- none Head- atraumatic            Eyes- Gross vision intact, PERRLA, conjunctivae and secretions clear            Ears- Hearing, canals-normal            Nose- +  turbinate edema, no-Septal dev, mucus, polyps, erosion, perforation             Throat- Mallampati IV , mucosa clear , drainage- none, tonsils- atrophic, own teeth Neck- flexible , trachea midline, no stridor , thyroid  nl, carotid no bruit Chest - symmetrical excursion , unlabored           Heart/CV- RRR , no murmur , no gallop  , no rub, nl s1 s2                           - JVD- none , edema- none, stasis changes- none, varices- none           Lung-  wheeze-none, cough-none , dullness-none, rub- none           Chest wall-  Abd-  Br/ Gen/ Rectal- Not done, not indicated Extrem- cyanosis- none, clubbing, none, atrophy- none, strength- nl Neuro- grossly intact to observation

## 2024-03-20 ENCOUNTER — Ambulatory Visit: Admitting: Internal Medicine

## 2024-03-20 ENCOUNTER — Encounter: Payer: Self-pay | Admitting: Internal Medicine

## 2024-03-20 VITALS — BP 122/76 | HR 83 | Ht 65.0 in | Wt 332.0 lb

## 2024-03-20 DIAGNOSIS — J301 Allergic rhinitis due to pollen: Secondary | ICD-10-CM | POA: Diagnosis not present

## 2024-03-20 NOTE — Patient Instructions (Signed)
 Order- refer to [facility]- Allergy eval for Allergic Rhinitis  Try to use the CPAP any time you are sleeping.

## 2024-03-31 ENCOUNTER — Other Ambulatory Visit: Payer: Self-pay | Admitting: Internal Medicine

## 2024-03-31 DIAGNOSIS — K2100 Gastro-esophageal reflux disease with esophagitis, without bleeding: Secondary | ICD-10-CM

## 2024-03-31 DIAGNOSIS — I10 Essential (primary) hypertension: Secondary | ICD-10-CM

## 2024-04-02 ENCOUNTER — Encounter (INDEPENDENT_AMBULATORY_CARE_PROVIDER_SITE_OTHER): Payer: Self-pay

## 2024-04-08 ENCOUNTER — Ambulatory Visit (INDEPENDENT_AMBULATORY_CARE_PROVIDER_SITE_OTHER): Admitting: Internal Medicine

## 2024-04-08 ENCOUNTER — Encounter (INDEPENDENT_AMBULATORY_CARE_PROVIDER_SITE_OTHER): Payer: Self-pay | Admitting: Internal Medicine

## 2024-04-08 VITALS — BP 130/86 | HR 65 | Temp 98.1°F | Ht 65.0 in | Wt 322.0 lb

## 2024-04-08 DIAGNOSIS — E66813 Obesity, class 3: Secondary | ICD-10-CM | POA: Diagnosis not present

## 2024-04-08 DIAGNOSIS — R5383 Other fatigue: Secondary | ICD-10-CM

## 2024-04-08 DIAGNOSIS — E669 Obesity, unspecified: Secondary | ICD-10-CM | POA: Insufficient documentation

## 2024-04-08 DIAGNOSIS — Z1331 Encounter for screening for depression: Secondary | ICD-10-CM

## 2024-04-08 DIAGNOSIS — R7303 Prediabetes: Secondary | ICD-10-CM | POA: Diagnosis not present

## 2024-04-08 DIAGNOSIS — I10 Essential (primary) hypertension: Secondary | ICD-10-CM

## 2024-04-08 DIAGNOSIS — R0602 Shortness of breath: Secondary | ICD-10-CM | POA: Diagnosis not present

## 2024-04-08 DIAGNOSIS — G4733 Obstructive sleep apnea (adult) (pediatric): Secondary | ICD-10-CM | POA: Diagnosis not present

## 2024-04-08 DIAGNOSIS — Z6841 Body Mass Index (BMI) 40.0 and over, adult: Secondary | ICD-10-CM

## 2024-04-08 NOTE — Assessment & Plan Note (Addendum)
 Contributing factors:family history of obesity, disruption of circadian rhythm / sleep disordered breathing, consumption of processed foods, moderate to high levels of stress, reduced physical activity, chronic skipping of meals, mental health problems, menopause, need for convenience due to lack of time, multiple weight loss attempts in the past, sedentary job, and need for convenient foods  Patient had been identified as having binge eating disorder by primary care having on Vyvanse  in the past as well as Ozempic .  Has not received psychotherapy.  I do not think she fully meets criteria but this will be further assessed at the next office visit once we have stabilized her nutrition as some of her impaired satiety as well as cravings may be due to a diet high in carbohydrate intake and presence of insulin  resistance.  See obesity treatment plan

## 2024-04-08 NOTE — Progress Notes (Signed)
 [address] [place],  [place], [place] [address]  Office: [phone]  /  Fax: [phone]   Subjective   Initial Visit  Alicia Hoover (MR# [id number]) is a 48 y.o. female who presents for evaluation and treatment of obesity and related comorbidities. Current BMI is Body mass index is 53.58 kg/m. Alicia Hoover has been struggling with her weight for many years and has been unsuccessful in either losing weight, maintaining weight loss, or reaching her healthy weight goal.  Alicia Hoover is currently in the action stage of change and ready to dedicate time achieving and maintaining a healthier weight. Alicia Hoover is interested in becoming our patient and working on intensive lifestyle modifications including (but not limited to) diet and exercise for weight loss.  Weight history:  When asked how their weight has affected their life and health, she states: Has affected self-esteem, Contributed to medical problems, Having fatigue, Having poor endurance, Problems with eating patterns, and Has affected mood   When asked what else they would like to accomplish? She states: Adopt a healthier eating pattern and lifestyle, Improve energy levels and physical activity, Improve existing medical conditions, Improve quality of life, and Improve self-confidence  She starting to note weight gain during : pregnancy.  Life events associated with weight gain include : mental health problems and stress was pregnant at young age.   Other contributing factors: family history of obesity, disruption of circadian rhythm / sleep disordered breathing, consumption of processed foods, moderate to high levels of stress, reduced physical activity, chronic skipping of meals, mental health problems, menopause, need for convenience due to lack of time, multiple weight loss attempts in the past, sedentary job, and need for convenient foods.  Their highest weight has been:  322 lbs.  Desired weight: 200  Previous weight-loss programs :  Weight Watchers.  Their maximum weight loss was:  60 lbs.  Their greatest challenge with dieting: meal preparation and cooking.  Current or previous pharmacotherapy: GLP-1 (Ozempic ), Phentermine  (used 3+ yrs ago with no clinical effect/wt loss), and Vyvanse  (for Binge Eating Disorder, which did help with curving appetite along with Ozempic ). .  Response to medication: Lost weight initially but was unable to sustain weight loss   Nutritional History:  Current nutrition plan: None.  How many times do you eat outside the home: 1-2 per week  How often do they skip meals: skips lunch  What beverages do they drink: water, caffeinated beverages , regular soda , juice, smoothies, and alcohol, type: wine, twice a month.   Use of artificial sweetners : No  Food intolerances or dislikes: none.  Food triggers: Stress, Boredom, When angry or upset, Help stay awake, To help comfort self, and When Sad.  Food cravings: Sugary and Starches / Carbohydrates  Do they struggle with excessive hunger or portion control : Yes    Physical Activity:  Current level of physical activity: None  Barriers to Exercise: orthopedic problems and chronic pain   Past medical history includes:   Past Medical History:  Diagnosis Date   Acid reflux    Anxiety    Asthma    Binge eating disorder    Bladder incontinence    Depression    GERD (gastroesophageal reflux disease)    Graves disease    Hypersomnia with sleep apnea    Hypertension    Hyperthyroidism    Migraine    Obesity    OSA (obstructive sleep apnea)    Osteoarthritis of hip    Prediabetes    Rhinitis  Seasonal allergies    Vitiligo      Objective   BP 130/86   Pulse 65   Temp 98.1 F (36.7 C)   Ht 5' 5 (1.651 m)   Wt (!) 322 lb (146.1 kg)   LMP 03/13/2024   SpO2 100%   BMI 53.58 kg/m  She was weighed on the bioimpedance scale: Body mass index is 53.58 kg/m.    Anthropometrics:  Vitals Temp: 98.1 F (36.7  C) BP: 130/86 Pulse Rate: 65 SpO2: 100 %   Anthropometric Measurements Height: 5' 5 (1.651 m) Weight: (!) 322 lb (146.1 kg) BMI (Calculated): 53.58 Starting Weight: 322 lb Peak Weight: 330 lb   Body Composition  Body Fat %: 49.3 % Fat Mass (lbs): 159 lbs Muscle Mass (lbs): 155.2 lbs Total Body Water (lbs): 106 lbs Visceral Fat Rating : 19   Other Clinical Data RMR: 2102 Fasting: yes Labs: yes Today's Visit #: 1 Starting Date: 04/08/24    Physical Exam:  General: She is overweight, cooperative, alert, well developed, and in no acute distress. PSYCH: Has normal mood, affect and thought process.   HEENT: EOMI, sclerae are anicteric. Lungs: Normal breathing effort, no conversational dyspnea. Extremities: No edema.  Neurologic: No gross sensory or motor deficits. No tremors or fasciculations noted.    Diagnostic Data Reviewed  EKG: Normal sinus rhythm, rate 67 bpm. No conduction abnormalities, abnormal Q waves or chamber enlargement.  Indirect Calorimeter completed today shows a VO2 of 305 and a REE of 2102.  Her calculated basal metabolic rate is 7642 thus her resting energy expenditure slower than calculated.  Depression Screen  Almeter's PHQ-9 score was: 3.     04/08/2024    9:02 AM  Depression screen PHQ 2/9  Decreased Interest 0  Down, Depressed, Hopeless 0  PHQ - 2 Score 0  Altered sleeping 1  Tired, decreased energy 1  Change in appetite 0  Feeling bad or failure about yourself  0  Trouble concentrating 1  Moving slowly or fidgety/restless 0  Suicidal thoughts 0  PHQ-9 Score 3  Difficult doing work/chores Somewhat difficult    Screening for Sleep Related Breathing Disorders  Dakiyah admits to daytime somnolence and admits to waking up still tired. Patient has a history of symptoms of morning fatigue and morning headache. Alicia Hoover generally gets 4 to 6 hours of sleep per night, and states that she does not sleep well most nights. Snoring is present  when not wearing CPAP. Apneic episodes are present. Epworth Sleepiness Score is 24.   BMET    Component Value Date/Time   NA 137 09/10/2023 1532   K 3.8 09/10/2023 1532   CL 100 09/10/2023 1532   CO2 32 09/10/2023 1532   GLUCOSE 92 09/10/2023 1532   BUN 16 09/10/2023 1532   CREATININE 0.91 09/10/2023 1532   CALCIUM 9.4 09/10/2023 1532   GFRNONAA >60 10/03/2020 1347   GFRAA >60 10/31/2016 1530   Lab Results  Component Value Date   HGBA1C 5.9 07/25/2022   HGBA1C 6.1 02/24/2014   No results found for: INSULIN  CBC    Component Value Date/Time   WBC 10.5 09/10/2023 1532   RBC 5.27 (H) 09/10/2023 1532   HGB 13.4 09/10/2023 1532   HCT 41.5 09/10/2023 1532   PLT 391.0 09/10/2023 1532   MCV 78.8 09/10/2023 1532   MCH 26.4 10/03/2020 1347   MCHC 32.3 09/10/2023 1532   RDW 14.8 09/10/2023 1532   Iron/TIBC/Ferritin/ %Sat No results found for: IRON, TIBC, FERRITIN,  IRONPCTSAT Lipid Panel     Component Value Date/Time   CHOL 138 10/05/2021 0953   TRIG 140.0 10/05/2021 0953   HDL 35.80 (L) 10/05/2021 0953   CHOLHDL 4 10/05/2021 0953   VLDL 28.0 10/05/2021 0953   LDLCALC 74 10/05/2021 0953   LDLDIRECT 57.0 03/27/2017 1511   Hepatic Function Panel     Component Value Date/Time   PROT 7.0 03/18/2023 1614   ALBUMIN 4.0 03/18/2023 1614   AST 23 03/18/2023 1614   ALT 26 03/18/2023 1614   ALKPHOS 64 03/18/2023 1614   BILITOT 0.2 03/18/2023 1614   BILIDIR 0.1 03/18/2023 1614      Component Value Date/Time   TSH 1.21 03/17/2024 1119          Assessment and Plan   TREATMENT PLAN FOR OBESITY:  Recommended Dietary Goals  Suzann is currently in the action stage of change. As such, her goal is to implement medically supervised obesity management plan.  She has agreed to implement: the Category 3 plan - 1500 kcal per day  Behavioral Intervention  We discussed the following Behavioral Modification Strategies today: increasing lean protein intake to  established goals, decreasing simple carbohydrates , increasing vegetables, increasing lower glycemic fruits, increasing fiber rich foods, avoiding skipping meals, increasing water intake, work on meal planning and preparation, reading food labels , keeping healthy foods at home, identifying sources and decreasing liquid calories, decreasing eating out or consumption of processed foods, and making healthy choices when eating convenient foods, planning for success, and better snacking choices  Additional resources provided today: Handout on healthy eating and balanced plate, Handout on complex carbohydrates and lean sources of protein, and Category 3 packet, handout on basics of weight management  Recommended Physical Activity Goals  Chessa has been advised to work up to 150 minutes of moderate intensity aerobic activity a week and strengthening exercises 2-3 times per week for cardiovascular health, weight loss maintenance and preservation of muscle mass.   She has agreed to :  Think about enjoyable ways to increase daily physical activity and overcoming barriers to exercise and Increase physical activity in their day and reduce sedentary time (increase NEAT).  Medical Interventions and Pharmacotherapy We will work on building a Therapist, art and behavioral strategies. We will discuss the role of pharmacotherapy as an adjunct at subsequent visits.   ASSOCIATED CONDITIONS ADDRESSED TODAY  Other Fatigue Tya admits to daytime somnolence and admits to waking up still tired. Patient has a history of symptoms of morning fatigue and morning headache. Leanor generally gets 4 to 6 hours of sleep per night, and states that she does not sleep well most nights. Snoring is present when not wearing CPAP. Apneic episodes are present. Epworth Sleepiness Score is 24. SABRA Mare does feel that her weight is causing her energy to be lower than it should be. Fatigue may be related to obesity,  depression or many other causes. Labs will be ordered, and in the meanwhile, Saphyre will focus on self care including making healthy food choices, increasing physical activity and focusing on stress reduction.  Shortness of Breath Katherine notes increasing shortness of breath with physical activity and seems to be worsening over time with weight gain. She notes getting out of breath sooner with activity than she used to. This has not gotten worse recently. Suzzane denies shortness of breath at rest or orthopnea.  Other fatigue -     EKG 12-Lead  Depression screen  SOB (shortness of breath) on exertion  OSA (obstructive sleep apnea) - moderate on CPAP Assessment & Plan: According to patient moderate, untreated discussed bidirectional relationship between obesity and sleep apnea.  Losing 15% of body weight may reduce AHI.  She is clearly symptomatic had an Epworth score of 24.  She would benefit from a repeat sleep study.  She may also benefit from treatment with GLP-1.    Essential hypertension, benign Assessment & Plan: Blood pressure slightly above target of less than 130/80.  Currently on triamterene  hydrochlorothiazide .  We will check renal parameters today.  Losing 10% of body weight may improve blood pressure control.  Also treatment with GLP-1.  Orders: -     Vitamin B12 -     CBC with Differential/Platelet -     Comprehensive metabolic panel with GFR -     Hemoglobin A1c -     Insulin , random -     VITAMIN D  25 Hydroxy (Vit-D Deficiency, Fractures) -     Lipid Panel With LDL/HDL Ratio  Class 3 severe obesity with serious comorbidity and body mass index (BMI) of 50.0 to 59.9 in adult, unspecified obesity type Assessment & Plan: Contributing factors:family history of obesity, disruption of circadian rhythm / sleep disordered breathing, consumption of processed foods, moderate to high levels of stress, reduced physical activity, chronic skipping of meals, mental health problems,  menopause, need for convenience due to lack of time, multiple weight loss attempts in the past, sedentary job, and need for convenient foods  Patient had been identified as having binge eating disorder by primary care having on Vyvanse  in the past as well as Ozempic .  Has not received psychotherapy.  I do not think she fully meets criteria but this will be further assessed at the next office visit once we have stabilized her nutrition as some of her impaired satiety as well as cravings may be due to a diet high in carbohydrate intake and presence of insulin  resistance.  See obesity treatment plan  Orders: -     Vitamin B12 -     CBC with Differential/Platelet -     Comprehensive metabolic panel with GFR -     Hemoglobin A1c -     Insulin , random -     VITAMIN D  25 Hydroxy (Vit-D Deficiency, Fractures) -     Lipid Panel With LDL/HDL Ratio  Prediabetes Assessment & Plan: Most recent A1c is  Lab Results  Component Value Date   HGBA1C 5.9 07/25/2022   HGBA1C 6.1 02/24/2014    Patient aware of disease state and risk of progression. This may contribute to abnormal cravings, fatigue and diabetic complications without having diabetes.   We have discussed treatment options which include: losing 7 to 10% of body weight, increasing physical activity to a goal of 150 minutes a week at moderate intensity.  Advised to maintain a diet low on simple and processed carbohydrates.  She had been on Ozempic  in the past.  She may benefit from pharmacoprophylaxis with metformin and will look into coverage for either Wegovy  or Zepbound .   Orders: -     Vitamin B12 -     CBC with Differential/Platelet -     Comprehensive metabolic panel with GFR -     Hemoglobin A1c -     Insulin , random -     VITAMIN D  25 Hydroxy (Vit-D Deficiency, Fractures) -     Lipid Panel With LDL/HDL Ratio    Follow-up  She was informed of the importance of frequent follow-up visits to  maximize her success with intensive  lifestyle modifications for her multiple health conditions. She was informed we would discuss her lab results at her next visit unless there is a critical issue that needs to be addressed sooner. Gloristine agreed to keep her next visit at the agreed upon time to discuss these results.  Attestation Statement  This is the patient's intake visit at [facility]. The patient's Health Questionnaire was reviewed at length. Included in the packet: current and past health history, medications, allergies, ROS, gynecologic history (women only), surgical history, family history, social history, weight history, weight loss surgery history (for those that have had weight loss surgery), nutritional evaluation, mood and food questionnaire, PHQ9, Epworth questionnaire, sleep habits questionnaire, patient life and health improvement goals questionnaire. These will all be scanned into the patient's chart under media.   During the visit, I independently reviewed the patient's EKG, previous labs, bioimpedance scale results, and indirect calorimetry results. I used this information to medically tailor a meal plan for the patient that will help her to lose weight and will improve her obesity-related conditions. I performed a medically necessary appropriate examination and/or evaluation. I discussed the assessment and treatment plan with the patient. The patient was provided an opportunity to ask questions and all were answered. The patient agreed with the plan and demonstrated an understanding of the instructions. Labs were ordered at this visit and will be reviewed at the next visit unless critical results need to be addressed immediately. Clinical information was updated and documented in the EMR.   In addition, they received basic education on identification of processed foods and reduction of these, different sources of lean proteins and complex carbohydrates and how to eat balanced by incorporation of whole  foods.  Reviewed by clinician on day of visit: allergies, medications, problem list, medical history, surgical history, family history, social history, and previous encounter notes.  I have spent 61 minutes in the care of the patient today including: 5 minutes before the visit reviewing and preparing the chart. 41 minutes face-to-face assessing and reviewing listed medical problems as outlined in obesity care plan, providing nutritional and behavioral counseling on topics outlined in the obesity care plan, counseling regarding anti-obesity medication as outlined in obesity care plan, independently interpreting test results and goals of care, as described in assessment and plan, reviewing and discussing biometric information and progress, and ordering diagnostics - see orders 15 minutes after the visit updating chart and documentation of encounter.       Lucas Parker, MD

## 2024-04-08 NOTE — Assessment & Plan Note (Addendum)
 According to patient moderate, untreated discussed bidirectional relationship between obesity and sleep apnea.  Losing 15% of body weight may reduce AHI.  She is clearly symptomatic had an Epworth score of 24.  She would benefit from a repeat sleep study.  She may also benefit from treatment with GLP-1.

## 2024-04-08 NOTE — Assessment & Plan Note (Signed)
 Blood pressure slightly above target of less than 130/80.  Currently on triamterene  hydrochlorothiazide .  We will check renal parameters today.  Losing 10% of body weight may improve blood pressure control.  Also treatment with GLP-1.

## 2024-04-08 NOTE — Assessment & Plan Note (Signed)
 Most recent A1c is  Lab Results  Component Value Date   HGBA1C 5.9 07/25/2022   HGBA1C 6.1 02/24/2014    Patient aware of disease state and risk of progression. This may contribute to abnormal cravings, fatigue and diabetic complications without having diabetes.   We have discussed treatment options which include: losing 7 to 10% of body weight, increasing physical activity to a goal of 150 minutes a week at moderate intensity.  Advised to maintain a diet low on simple and processed carbohydrates.  She had been on Ozempic  in the past.  She may benefit from pharmacoprophylaxis with metformin and will look into coverage for either Wegovy  or Zepbound .

## 2024-04-09 LAB — COMPREHENSIVE METABOLIC PANEL WITH GFR
ALT: 24 IU/L (ref 0–32)
AST: 25 IU/L (ref 0–40)
Albumin: 4.2 g/dL (ref 3.9–4.9)
Alkaline Phosphatase: 74 IU/L (ref 44–121)
BUN/Creatinine Ratio: 14 (ref 9–23)
BUN: 13 mg/dL (ref 6–24)
Bilirubin Total: 0.3 mg/dL (ref 0.0–1.2)
CO2: 20 mmol/L (ref 20–29)
Calcium: 9.4 mg/dL (ref 8.7–10.2)
Chloride: 99 mmol/L (ref 96–106)
Creatinine, Ser: 0.96 mg/dL (ref 0.57–1.00)
Globulin, Total: 2.8 g/dL (ref 1.5–4.5)
Glucose: 91 mg/dL (ref 70–99)
Potassium: 3.8 mmol/L (ref 3.5–5.2)
Sodium: 139 mmol/L (ref 134–144)
Total Protein: 7 g/dL (ref 6.0–8.5)
eGFR: 73 mL/min/{1.73_m2} (ref >59)

## 2024-04-09 LAB — CBC WITH DIFFERENTIAL/PLATELET
Basophils Absolute: 0 10*3/uL (ref 0.0–0.2)
Basos: 0 %
EOS (ABSOLUTE): 0.2 10*3/uL (ref 0.0–0.4)
Eos: 2 %
Hematocrit: 42.6 % (ref 34.0–46.6)
Hemoglobin: 13.4 g/dL (ref 11.1–15.9)
Immature Grans (Abs): 0 10*3/uL (ref 0.0–0.1)
Immature Granulocytes: 0 %
Lymphocytes Absolute: 2.3 10*3/uL (ref 0.7–3.1)
Lymphs: 24 %
MCH: 25.6 pg — ABNORMAL LOW (ref 26.6–33.0)
MCHC: 31.5 g/dL (ref 31.5–35.7)
MCV: 82 fL (ref 79–97)
Monocytes Absolute: 0.7 10*3/uL (ref 0.1–0.9)
Monocytes: 7 %
Neutrophils Absolute: 6.4 10*3/uL (ref 1.4–7.0)
Neutrophils: 67 %
Platelets: 377 10*3/uL (ref 150–450)
RBC: 5.23 x10E6/uL (ref 3.77–5.28)
RDW: 14.2 % (ref 11.7–15.4)
WBC: 9.7 10*3/uL (ref 3.4–10.8)

## 2024-04-09 LAB — LIPID PANEL WITH LDL/HDL RATIO
Cholesterol, Total: 131 mg/dL (ref 100–199)
HDL: 43 mg/dL (ref >39)
LDL Chol Calc (NIH): 62 mg/dL (ref 0–99)
LDL/HDL Ratio: 1.4 ratio (ref 0.0–3.2)
Triglycerides: 150 mg/dL — ABNORMAL HIGH (ref 0–149)
VLDL Cholesterol Cal: 26 mg/dL (ref 5–40)

## 2024-04-09 LAB — INSULIN, RANDOM: INSULIN: 26.6 u[IU]/mL — ABNORMAL HIGH (ref 2.6–24.9)

## 2024-04-09 LAB — HEMOGLOBIN A1C
Est. average glucose Bld gHb Est-mCnc: 128 mg/dL
Hgb A1c MFr Bld: 6.1 % — ABNORMAL HIGH (ref 4.8–5.6)

## 2024-04-09 LAB — VITAMIN D 25 HYDROXY (VIT D DEFICIENCY, FRACTURES): Vit D, 25-Hydroxy: 53.1 ng/mL (ref 30.0–100.0)

## 2024-04-09 LAB — VITAMIN B12: Vitamin B-12: 653 pg/mL (ref 232–1245)

## 2024-04-22 ENCOUNTER — Encounter (INDEPENDENT_AMBULATORY_CARE_PROVIDER_SITE_OTHER): Payer: Self-pay | Admitting: Internal Medicine

## 2024-04-22 ENCOUNTER — Ambulatory Visit (INDEPENDENT_AMBULATORY_CARE_PROVIDER_SITE_OTHER): Admitting: Internal Medicine

## 2024-04-22 VITALS — BP 137/82 | HR 80 | Temp 98.2°F | Ht 65.0 in | Wt 319.0 lb

## 2024-04-22 DIAGNOSIS — G4733 Obstructive sleep apnea (adult) (pediatric): Secondary | ICD-10-CM | POA: Diagnosis not present

## 2024-04-22 DIAGNOSIS — E88819 Insulin resistance, unspecified: Secondary | ICD-10-CM | POA: Insufficient documentation

## 2024-04-22 DIAGNOSIS — Z723 Lack of physical exercise: Secondary | ICD-10-CM | POA: Insufficient documentation

## 2024-04-22 DIAGNOSIS — E66813 Obesity, class 3: Secondary | ICD-10-CM | POA: Diagnosis not present

## 2024-04-22 DIAGNOSIS — R7303 Prediabetes: Secondary | ICD-10-CM

## 2024-04-22 DIAGNOSIS — Z6841 Body Mass Index (BMI) 40.0 and over, adult: Secondary | ICD-10-CM

## 2024-04-22 MED ORDER — METFORMIN HCL ER 500 MG PO TB24
500.0000 mg | ORAL_TABLET | Freq: Two times a day (BID) | ORAL | 0 refills | Status: DC
Start: 2024-04-22 — End: 2024-06-15

## 2024-04-22 NOTE — Assessment & Plan Note (Signed)
 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 Optimal level < 1.9.   This is complex condition associated with genetics, ectopic fat and lifestyle factors. Insulin  resistance may result in increased fat storage, inhibition of the breakdown of fat, cause fluctuations in blood sugar leading to energy crashes and increased cravings for sugary or high carb foods and cause metabolic slowdown making it difficult to lose weight.  This may result in additional weight gain and lead to pre-diabetes and diabetes if untreated. In addition, hyperinsulinemia increases cardiovascular risk, chronic inflammatory response and may increase the risk of obesity related malignancies.  Lab Results  Component Value Date   HGBA1C 6.1 (H) 04/08/2024   Lab Results  Component Value Date   INSULIN  26.6 (H) 04/08/2024   Lab Results  Component Value Date   GLUCOSE 91 04/08/2024   GLUCOSE 85 02/24/2014    We reviewed treatment options which include losing 7 to 10% of body weight, increasing volume of physical activity and maintaining a diet low in saturated fats and with a low glycemic load.  Patient has also been educated on the carb insulin  model of obesity.  She will be started on metformin  XR 500 mg twice a day

## 2024-04-22 NOTE — Progress Notes (Signed)
 Office: [phone]  /  Fax: [phone]  Weight Summary and Body Composition Analysis (BIA)  Vitals Temp: 98.2 F (36.8 C) BP: 137/82 Pulse Rate: 80 SpO2: 96 %   Anthropometric Measurements Height: 5' 5 (1.651 m) Weight: (!) 319 lb (144.7 kg) BMI (Calculated): 53.08 Weight at Last Visit: 322 lb Weight Lost Since Last Visit: 3 lb Weight Gained Since Last Visit: 0 lb Starting Weight: 322 lb Total Weight Loss (lbs): 3 lb (1.361 kg) Peak Weight: 330 lb   Body Composition  Body Fat %: 49.3 % Fat Mass (lbs): 157.2 lbs Muscle Mass (lbs): 153.6 lbs Total Body Water (lbs): 110 lbs Visceral Fat Rating : 19    RMR: 2102  Today's Visit #: 2  Starting Date: 04/08/24   Subjective   Chief Complaint: Obesity  Interval History Discussed the use of AI scribe software for clinical note transcription with the patient, who gave verbal consent to proceed.  History of Present Illness   Alicia Hoover is a 48 year old female who presents for a post intake appointment following a weight loss program.  She has lost three pounds since her last office visit and is adhering to a 1500 calorie nutrition plan, focusing on whole foods, adequate protein intake, and hydration. She is not skipping meals but is not currently exercising. She is exploring ways to diversify her diet to avoid boredom, such as using an air fryer for cooking and incorporating different types of foods like [place] bacon and various vegetables. She is lactose intolerant but tolerates Fairlife milk well.  Her current breakfast options include eggs, sliced tomatoes, and Wheaties protein cereal. She is considering adding protein powder to her oatmeal and making protein pancakes. For lunch, she consumes meals like eggplant, squash, and green peppers with [place] yogurt coleslaw. She is actively tracking her food intake using a phone app and is mindful of her protein intake, aiming for 90 to 120 grams per day. She is trying  to manage her snack intake by packing snacks but finds herself eating them out of habit rather than hunger.  Her family history includes diabetes, as both her mother and father were diabetic. She has a history of smoking Alicia 2001 for one year but has since quit. 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  Recent blood work shows a blood sugar level of 91, a GFR of 73, normal liver enzymes, and a total cholesterol of 131 with LDL at 62. Her triglycerides are slightly elevated. Her A1c is 6.1, and she has high insulin  levels.       Challenges affecting patient progress: low volume of physical activity at present .    Pharmacotherapy for weight management: She is currently taking no anti-obesity medication.   Assessment and Plan   Treatment Plan For Obesity:  Recommended Dietary Goals  Alicia Hoover is currently Alicia the action stage of change. As such, her goal is to continue weight management plan. She has agreed to: continue current plan  Behavioral Health and Counseling  We discussed the following behavioral modification strategies today: continue to work on maintaining a reduced calorie state, getting the recommended amount of protein, incorporating whole foods, making healthy choices, staying well hydrated and practicing mindfulness when eating..  Additional education and resources provided today: None  Recommended Physical Activity Goals  Alicia Hoover has been advised to work up to 150 minutes of moderate intensity aerobic activity a week and strengthening exercises 2-3 times per week for cardiovascular health, weight loss  maintenance and preservation of muscle mass.   She has agreed to :  Think about enjoyable ways to increase daily physical activity and overcoming barriers to exercise and Increase physical activity Alicia their day and reduce sedentary time (increase NEAT).  Medical Interventions and Pharmacotherapy  We discussed various medication options to help Alicia Hoover with her weight  loss efforts and we both agreed to : Start metformin  XR 500 mg 1 tablet twice a day for diabetes prevention and weight management  Associated Conditions Impacted by Obesity Treatment  Assessment & Plan Class 3 severe obesity with serious comorbidity and body mass index (BMI) of 50.0 to 59.9 Alicia adult, unspecified obesity type She has lost three pounds since the last visit, adhering to a 1500 calorie nutrition plan. She is not currently exercising but is considering becoming more physically active. She is exploring healthier cooking methods and food options to avoid boredom and maintain her diet. She is motivated to reach her goal of losing 100 pounds without surgery. - Encourage continued adherence to the 1500 calorie nutrition plan. - Advise on using an air fryer, baking, and grilling as healthier cooking methods. - Discuss the use of light butter and oil sprays to reduce calorie intake. - Recommend increasing intake of fruits and vegetables. - Suggest exploring new recipes and food options to prevent dietary boredom. - Encourage the use of protein-rich foods and alternatives like Fairlife milk. - Advise on planning meals Alicia bulk to simplify meal preparation. - Encourage healthier snack options like air-popped popcorn, fruits with nuts, or veggies with hummus. Prediabetes Most recent A1c is  Lab Results  Component Value Date   HGBA1C 6.1 (H) 04/08/2024   HGBA1C 6.1 02/24/2014    Patient aware of disease state and risk of progression. This may contribute to abnormal cravings, fatigue and diabetic complications without having diabetes.   We have discussed treatment options which include: losing 7 to 10% of body weight, increasing physical activity to a goal of 150 minutes a week at moderate intensity.  Advised to maintain a diet low on simple and processed carbohydrates.  After discussion of benefits and side effect she will be started on metformin  XR 500 mg twice a day for  pharmacoprophylaxis.  OSA (obstructive sleep apnea) Untreated.  Consider follow-up sleep study.  Losing 15% of body weight may reduce AHI she may also be a candidate for GLP-1.  Insulin  resistance Her HOMA-IR is 5.84 which is elevated. Optimal level < 1.9.   This is complex condition associated with genetics, ectopic fat and lifestyle factors. Insulin  resistance may result Alicia increased fat storage, inhibition of the breakdown of fat, cause fluctuations Alicia blood sugar leading to energy crashes and increased cravings for sugary or high carb foods and cause metabolic slowdown making it difficult to lose weight.  This may result Alicia additional weight gain and lead to pre-diabetes and diabetes if untreated. Alicia Hoover, hyperinsulinemia increases cardiovascular risk, chronic inflammatory response and may increase the risk of obesity related malignancies.  Lab Results  Component Value Date   HGBA1C 6.1 (H) 04/08/2024   Lab Results  Component Value Date   INSULIN  26.6 (H) 04/08/2024   Lab Results  Component Value Date   GLUCOSE 91 04/08/2024   GLUCOSE 85 02/24/2014    We reviewed treatment options which include losing 7 to 10% of body weight, increasing volume of physical activity and maintaining a diet low Alicia saturated fats and with a low glycemic load.  Patient has also been educated on  the carb insulin  model of obesity.  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  We discussed the benefits of regular physical activity    General Health Maintenance She is up to date with mammogram and colon cancer screening. She is due for a Pap smear this year. She smoked for one year Alicia 2001. - Schedule a Pap smear as she is due this year. - Encourage continued adherence to cancer screening guidelines.        Objective   Physical Exam:  Blood pressure 137/82, pulse 80,  temperature 98.2 F (36.8 C), height 5' 5 (1.651 m), weight (!) 319 lb (144.7 kg), last menstrual period 03/13/2024, SpO2 96%. Body mass index is 53.08 kg/m.  General: She is overweight, cooperative, alert, well developed, and Alicia no acute distress. PSYCH: Has normal mood, affect and thought process.   HEENT: EOMI, sclerae are anicteric. Lungs: Normal breathing effort, no conversational dyspnea. Extremities: No edema.  Neurologic: No gross sensory or motor deficits. No tremors or fasciculations noted.    Diagnostic Data Reviewed:  BMET    Component Value Date/Time   NA 139 04/08/2024 1024   K 3.8 04/08/2024 1024   CL 99 04/08/2024 1024   CO2 20 04/08/2024 1024   GLUCOSE 91 04/08/2024 1024   GLUCOSE 92 09/10/2023 1532   BUN 13 04/08/2024 1024   CREATININE 0.96 04/08/2024 1024   CALCIUM 9.4 04/08/2024 1024   GFRNONAA >60 10/03/2020 1347   GFRAA >60 10/31/2016 1530   Lab Results  Component Value Date   HGBA1C 6.1 (H) 04/08/2024   HGBA1C 6.1 02/24/2014   Lab Results  Component Value Date   INSULIN  26.6 (H) 04/08/2024   Lab Results  Component Value Date   TSH 1.21 03/17/2024   CBC    Component Value Date/Time   WBC 9.7 04/08/2024 1024   WBC 10.5 09/10/2023 1532   RBC 5.23 04/08/2024 1024   RBC 5.27 (H) 09/10/2023 1532   HGB 13.4 04/08/2024 1024   HCT 42.6 04/08/2024 1024   PLT 377 04/08/2024 1024   MCV 82 04/08/2024 1024   MCH 25.6 (L) 04/08/2024 1024   MCH 26.4 10/03/2020 1347   MCHC 31.5 04/08/2024 1024   MCHC 32.3 09/10/2023 1532   RDW 14.2 04/08/2024 1024   Iron Studies No results found for: IRON, TIBC, FERRITIN, IRONPCTSAT Lipid Panel     Component Value Date/Time   CHOL 131 04/08/2024 1024   TRIG 150 (H) 04/08/2024 1024   HDL 43 04/08/2024 1024   CHOLHDL 4 10/05/2021 0953   VLDL 28.0 10/05/2021 0953   LDLCALC 62 04/08/2024 1024   LDLDIRECT 57.0 03/27/2017 1511   Hepatic Function Panel     Component Value Date/Time   PROT 7.0  04/08/2024 1024   ALBUMIN 4.2 04/08/2024 1024   AST 25 04/08/2024 1024   ALT 24 04/08/2024 1024   ALKPHOS 74 04/08/2024 1024   BILITOT 0.3 04/08/2024 1024   BILIDIR 0.1 03/18/2023 1614      Component Value Date/Time   TSH 1.21 03/17/2024 1119   Nutritional Lab Results  Component Value Date   VD25OH 53.1 04/08/2024   VD25OH 36.31 04/08/2019    Medications: Outpatient Encounter Medications as of 04/22/2024  Medication Sig   metFORMIN  (GLUCOPHAGE -XR) 500 MG 24 hr tablet Take 1 tablet (500 mg total) by mouth 2 (two) times daily with a  meal.   albuterol  (VENTOLIN  HFA) 108 (90 Base) MCG/ACT inhaler Inhale 2 puffs into the lungs every 4 (four) hours as needed for wheezing or shortness of breath.   diclofenac  (VOLTAREN ) 75 MG EC tablet Take 1 tablet (75 mg total) by mouth 2 (two) times daily.   FIBER-VITAMINS-MINERALS PO Take by mouth.   fluocinonide -emollient (LIDEX -E) 0.05 % cream APPLY TO AFFECTED AREA TWICE A DAY   fluticasone (FLONASE) 50 MCG/ACT nasal spray Place 2 sprays into the nose as needed for allergies.   HYDROcodone  bit-homatropine (HYCODAN) 5-1.5 MG/5ML syrup Take 5 mLs by mouth every 8 (eight) hours as needed for cough. (Patient not taking: Reported on 03/20/2024)   ibuprofen  (ADVIL ) 800 MG tablet TAKE 1 TABLET BY MOUTH EVERY 8 HOURS AS NEEDED   Insulin  Pen Needle 32G X 6 MM MISC 1 Act by Does not apply route once a week. (Patient not taking: Reported on 04/08/2024)   levocetirizine (XYZAL ) 5 MG tablet TAKE 1 TABLET BY MOUTH EVERY DAY Alicia THE EVENING   methimazole  (TAPAZOLE ) 5 MG tablet Take 1 tablet (5 mg total) by mouth as directed. 1 tablet Monday through Saturday and none on Sundays   montelukast  (SINGULAIR ) 10 MG tablet TAKE 1 TABLET BY MOUTH EVERYDAY AT BEDTIME   Multiple Vitamin (MULTIVITAMIN) tablet Take 1 tablet by mouth daily.   norgestimate -ethinyl estradiol  (ORTHO-CYCLEN) 0.25-35 MG-MCG tablet Take 1 tablet by mouth daily.   nystatin -triamcinolone  ointment (MYCOLOG)  Apply 1 Application topically as needed.   ondansetron  (ZOFRAN -ODT) 4 MG disintegrating tablet Take 4 mg by mouth as needed for nausea or vomiting. (Patient not taking: Reported on 04/08/2024)   pantoprazole  (PROTONIX ) 40 MG tablet TAKE 1 TABLET BY MOUTH TWICE A DAY   promethazine -dextromethorphan (PROMETHAZINE -DM) 6.25-15 MG/5ML syrup Take 5 mLs by mouth 4 (four) times daily as needed.   promethazine -dextromethorphan (PROMETHAZINE -DM) 6.25-15 MG/5ML syrup Take 5 mLs by mouth 4 (four) times daily as needed for cough. (Patient not taking: Reported on 03/17/2024)   triamterene -hydrochlorothiazide  (DYAZIDE) 37.5-25 MG capsule TAKE 1 EACH (1 CAPSULE TOTAL) BY MOUTH DAILY.   Trospium  Chloride 60 MG CP24 Take 1 capsule (60 mg total) by mouth daily.   [DISCONTINUED] dexlansoprazole  (DEXILANT ) 60 MG capsule Take 1 capsule (60 mg total) by mouth daily.   No facility-administered encounter medications on file as of 04/22/2024.     Follow-Up   Return Alicia about 3 weeks (around 05/13/2024) for For Weight Mangement with Dr. Francyne.SABRA She was informed of the importance of frequent follow up visits to maximize her success with intensive lifestyle modifications for her multiple health conditions.  Attestation Statement   Reviewed by clinician on day of visit: allergies, medications, problem list, medical history, surgical history, family history, social history, and previous encounter notes.  I have spent 43 minutes Alicia the care of the patient today including: 3 minutes before the visit reviewing and preparing the chart. 35 minutes face-to-face assessing and reviewing listed medical problems as outlined Alicia obesity care plan, providing nutritional and behavioral counseling on topics outlined Alicia the obesity care plan, independently interpreting test results and goals of care, as described Alicia assessment and plan, reviewing and discussing biometric information and progress, and ordering medications - see orders 5  minutes after the visit updating chart and documentation of encounter.    Lucas Francyne, MD

## 2024-04-22 NOTE — Assessment & Plan Note (Signed)
 Untreated.  Consider follow-up sleep study.  Losing 15% of body weight may reduce AHI she may also be a candidate for GLP-1.

## 2024-04-22 NOTE — Assessment & Plan Note (Signed)
 Most recent A1c is  Lab Results  Component Value Date   HGBA1C 6.1 (H) 04/08/2024   HGBA1C 6.1 02/24/2014    Patient aware of disease state and risk of progression. This may contribute to abnormal cravings, fatigue and diabetic complications without having diabetes.   We have discussed treatment options which include: losing 7 to 10% of body weight, increasing physical activity to a goal of 150 minutes a week at moderate intensity.  Advised to maintain a diet low on simple and processed carbohydrates.  After discussion of benefits and side effect she will be started on metformin  XR 500 mg twice a day for pharmacoprophylaxis.

## 2024-04-22 NOTE — Assessment & Plan Note (Signed)
 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  We discussed the benefits of regular physical activity

## 2024-04-22 NOTE — Assessment & Plan Note (Signed)
 She has lost three pounds since the last visit, adhering to a 1500 calorie nutrition plan. She is not currently exercising but is considering becoming more physically active. She is exploring healthier cooking methods and food options to avoid boredom and maintain her diet. She is motivated to reach her goal of losing 100 pounds without surgery. - Encourage continued adherence to the 1500 calorie nutrition plan. - Advise on using an air fryer, baking, and grilling as healthier cooking methods. - Discuss the use of light butter and oil sprays to reduce calorie intake. - Recommend increasing intake of fruits and vegetables. - Suggest exploring new recipes and food options to prevent dietary boredom. - Encourage the use of protein-rich foods and alternatives like Fairlife milk. - Advise on planning meals in bulk to simplify meal preparation. - Encourage healthier snack options like air-popped popcorn, fruits with nuts, or veggies with hummus.

## 2024-05-13 ENCOUNTER — Ambulatory Visit (INDEPENDENT_AMBULATORY_CARE_PROVIDER_SITE_OTHER): Admitting: Internal Medicine

## 2024-05-13 ENCOUNTER — Encounter (INDEPENDENT_AMBULATORY_CARE_PROVIDER_SITE_OTHER): Payer: Self-pay | Admitting: Internal Medicine

## 2024-05-13 VITALS — BP 130/86 | HR 68 | Temp 98.7°F | Ht 65.0 in | Wt 310.0 lb

## 2024-05-13 DIAGNOSIS — I10 Essential (primary) hypertension: Secondary | ICD-10-CM | POA: Diagnosis not present

## 2024-05-13 DIAGNOSIS — E88819 Insulin resistance, unspecified: Secondary | ICD-10-CM

## 2024-05-13 DIAGNOSIS — G4733 Obstructive sleep apnea (adult) (pediatric): Secondary | ICD-10-CM | POA: Diagnosis not present

## 2024-05-13 DIAGNOSIS — R7303 Prediabetes: Secondary | ICD-10-CM

## 2024-05-13 DIAGNOSIS — Z6841 Body Mass Index (BMI) 40.0 and over, adult: Secondary | ICD-10-CM

## 2024-05-13 NOTE — Assessment & Plan Note (Signed)
 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 Optimal level < 1.9.   This is complex condition associated with genetics, ectopic fat and lifestyle factors. Insulin  resistance may result in increased fat storage, inhibition of the breakdown of fat, cause fluctuations in blood sugar leading to energy crashes and increased cravings for sugary or high carb foods and cause metabolic slowdown making it difficult to lose weight.  This may result in additional weight gain and lead to pre-diabetes and diabetes if untreated. In addition, hyperinsulinemia increases cardiovascular risk, chronic inflammatory response and may increase the risk of obesity related malignancies.  Lab Results  Component Value Date   HGBA1C 6.1 (H) 04/08/2024   Lab Results  Component Value Date   INSULIN  26.6 (H) 04/08/2024   Lab Results  Component Value Date   GLUCOSE 91 04/08/2024   GLUCOSE 85 02/24/2014    We reviewed treatment options which include losing 7 to 10% of body weight, increasing volume of physical activity and maintaining a diet low in saturated fats and with a low glycemic load.  Patient has also been educated on the carb insulin  model of obesity.  She is now on metformin  at 1000 mg a day but is having side effects.  We discussed taking the 2 pills in the morning if still has intolerance reduced to 1 a day.  She is also interested in starting Zepbound  so we may discontinue metformin .

## 2024-05-13 NOTE — Assessment & Plan Note (Signed)
 Blood pressure slightly above target of less than 130/80.  Currently on triamterene  hydrochlorothiazide .  Recent renal parameters are within normal limits losing 10% of body weight may improve blood pressure control.  Also treatment with GLP-1.

## 2024-05-13 NOTE — Assessment & Plan Note (Signed)
 I reviewed sleep study she has moderate sleep apnea with significant oxygen desaturations.  Losing 15% of body weight may reduce AHI also weight loss assisted by GLP-1.

## 2024-05-13 NOTE — Progress Notes (Signed)
 Office: [phone]  /  Fax: [phone]  Weight Summary and Body Composition Analysis (BIA)  Vitals Temp: 98.7 F (37.1 C) BP: 130/86 Pulse Rate: 68 SpO2: 99 %   Anthropometric Measurements Height: 5' 5 (1.651 m) Weight: (!) 310 lb (140.6 kg) BMI (Calculated): 51.59 Weight at Last Visit: 319 lb Weight Lost Since Last Visit: 9 ln Weight Gained Since Last Visit: 0 lb Starting Weight: 322 lb Total Weight Loss (lbs): 12 lb (5.443 kg) Peak Weight: 330 lb   Body Composition  Body Fat %: 47.6 % Fat Mass (lbs): 147.8 lbs Muscle Mass (lbs): 154.4 lbs Total Body Water (lbs): 104.2 lbs Visceral Fat Rating : 18    RMR: 2102  Today's Visit #: 3  Starting Date: 04/08/24   Subjective   Chief Complaint: Obesity  Interval History Discussed the use of AI scribe software for clinical note transcription with the patient, who gave verbal consent to proceed.  History of Present Illness   Alicia Hoover is a 48 year old female with prediabetes, sleep apnea, and insulin  resistance who presents for medical weight management.  Alicia Hoover has successfully lost a total of twelve pounds, with nine pounds lost since Alicia Hoover last visit. Alicia Hoover adheres to a 1500-calorie nutrition plan, tracking Alicia Hoover intake, consuming more whole foods, ensuring adequate protein, and maintaining hydration. Alicia Hoover occasionally skips meals but reports adequate sleep. Alicia Hoover exercise routine includes 60 minutes of cardio and strength training two to seven days a week.  Alicia Hoover has a history of prediabetes, sleep apnea, and insulin  resistance, with a HOMA-IR score of 5.84. Previously physically inactive, Alicia Hoover has started walking and participating in a 30-day squat challenge. Alicia Hoover began metformin  treatment but experiences gastrointestinal discomfort with the second pill, so Alicia Hoover is currently taking one pill. Alicia Hoover notes a decrease in appetite and cravings for sweets, finding them 'too sweet' when consumed.  A sleep study conducted in  December 2024 showed oxygen desaturation to 72%. Alicia Hoover received a new CPAP machine earlier this year and is using it regularly.       Challenges affecting patient progress: None   Pharmacotherapy for weight management: Alicia Hoover is currently taking Metformin  (off label use for incretin effect and / or insulin  resistance and / or diabetes prevention) with adequate clinical response  and experiencing the following side effects: Upset stomach with 1000 mg a day..   Assessment and Plan   Treatment Plan For Obesity:  Recommended Dietary Goals  Alicia Hoover is currently in the action stage of change. As such, Alicia Hoover goal is to continue weight management plan. Alicia Hoover has agreed to: continue current plan  Behavioral Health and Counseling  We discussed the following behavioral modification strategies today: continue to work on maintaining a reduced calorie state, getting the recommended amount of protein, incorporating whole foods, making healthy choices, staying well hydrated and practicing mindfulness when eating..  Additional education and resources provided today: None  Recommended Physical Activity Goals  Alicia Hoover has been advised to work up to 150 minutes of moderate intensity aerobic activity a week and strengthening exercises 2-3 times per week for cardiovascular health, weight loss maintenance and preservation of muscle mass.   Alicia Hoover has agreed to :  Think about enjoyable ways to increase daily physical activity and overcoming barriers to exercise and Increase physical activity in their day and reduce sedentary time (increase NEAT).  Medical Interventions and Pharmacotherapy  We discussed various medication options to help Alicia Hoover with Alicia Hoover weight loss efforts and we both agreed to : Continue with  current nutritional and behavioral strategies, Adequate clinical response to anti-obesity medication, continue current regimen, and patient is interested in starting GLP-1 Alicia Hoover may qualify based on degree of obesity  and cardiometabolic risk.  Associated Conditions Impacted by Obesity Treatment  Assessment & Plan Essential hypertension, benign Blood pressure slightly above target of less than 130/80.  Currently on triamterene  hydrochlorothiazide .  Recent renal parameters are within normal limits losing 10% of body weight may improve blood pressure control.  Also treatment with GLP-1. Insulin  resistance Alicia Hoover HOMA-IR is 5.84 which is elevated. Optimal level < 1.9.   This is complex condition associated with genetics, ectopic fat and lifestyle factors. Insulin  resistance may result in increased fat storage, inhibition of the breakdown of fat, cause fluctuations in blood sugar leading to energy crashes and increased cravings for sugary or high carb foods and cause metabolic slowdown making it difficult to lose weight.  This may result in additional weight gain and lead to pre-diabetes and diabetes if untreated. In addition, hyperinsulinemia increases cardiovascular risk, chronic inflammatory response and may increase the risk of obesity related malignancies.  Lab Results  Component Value Date   HGBA1C 6.1 (H) 04/08/2024   Lab Results  Component Value Date   INSULIN  26.6 (H) 04/08/2024   Lab Results  Component Value Date   GLUCOSE 91 04/08/2024   GLUCOSE 85 02/24/2014    We reviewed treatment options which include losing 7 to 10% of body weight, increasing volume of physical activity and maintaining a diet low in saturated fats and with a low glycemic load.  Patient has also been educated on the carb insulin  model of obesity.  Alicia Hoover is now on metformin  at 1000 mg a day but is having side effects.  We discussed taking the 2 pills in the morning if still has intolerance reduced to 1 a day.  Alicia Hoover is also interested in starting Zepbound  so we may discontinue metformin .  OSA (obstructive sleep apnea) I reviewed sleep study Alicia Hoover has moderate sleep apnea with significant oxygen desaturations.  Losing 15% of body  weight may reduce AHI also weight loss assisted by GLP-1. Prediabetes Most recent A1c is  Lab Results  Component Value Date   HGBA1C 6.1 (H) 04/08/2024   HGBA1C 6.1 02/24/2014    Patient aware of disease state and risk of progression. This may contribute to abnormal cravings, fatigue and diabetic complications without having diabetes.   We have discussed treatment options which include: losing 7 to 10% of body weight, increasing physical activity to a goal of 150 minutes a week at moderate intensity.  Advised to maintain a diet low on simple and processed carbohydrates.  Alicia Hoover is now on metformin  at 1000 mg a day but is having side effects.  We discussed taking the 2 pills in the morning if still has intolerance reduced to 1 a day.  Alicia Hoover is also interested in starting Zepbound  so we may discontinue metformin .  Morbid obesity (HCC) Obesity with prediabetes, insulin  resistance, and moderate obstructive sleep apnea Obesity with associated prediabetes, insulin  resistance, and moderate obstructive sleep apnea. Alicia Hoover has lost a total of 12 pounds since starting the weight management program, with a recent loss of 9 pounds. Alicia Hoover is adhering to a 1500 calorie nutrition plan, increasing physical activity, and has improved Alicia Hoover body composition with a decrease in body fat percentage and an increase in muscle mass. Metformin  is being used to manage insulin  resistance, but Alicia Hoover experiences gastrointestinal side effects with the second dose. There is a concern about  the long-term use of metformin  and its effects on insulin  production, which was clarified as a misconception. Metformin  reduces the risk of diabetes and helps manage insulin  resistance. Discussed the potential use of GLP-1 receptor agonists, such as Zepbound , for weight management, especially given Alicia Hoover moderate sleep apnea and associated conditions. Insurance coverage and prerequisites for GLP-1 therapy were discussed, with a plan to explore this further  in the next visit. - Continue 1500 calorie nutrition plan and regular physical activity. - Continue metformin  500 mg once daily; consider taking two pills with breakfast if tolerated. - Discuss GLP-1 receptor agonist therapy (e.g., Zepbound ) at the next visit, including insurance coverage and prerequisites. - Schedule follow-up in four weeks to discuss medication options and insurance prerequisites.             Objective   Physical Exam:  Blood pressure 130/86, pulse 68, temperature 98.7 F (37.1 C), height 5' 5 (1.651 m), weight (!) 310 lb (140.6 kg), last menstrual period 04/30/2024, SpO2 99%. Body mass index is 51.59 kg/m.  General: Alicia Hoover is overweight, cooperative, alert, well developed, and in no acute distress. PSYCH: Has normal mood, affect and thought process.   HEENT: EOMI, sclerae are anicteric. Lungs: Normal breathing effort, no conversational dyspnea. Extremities: No edema.  Neurologic: No gross sensory or motor deficits. No tremors or fasciculations noted.    Diagnostic Data Reviewed:  BMET    Component Value Date/Time   NA 139 04/08/2024 1024   K 3.8 04/08/2024 1024   CL 99 04/08/2024 1024   CO2 20 04/08/2024 1024   GLUCOSE 91 04/08/2024 1024   GLUCOSE 92 09/10/2023 1532   BUN 13 04/08/2024 1024   CREATININE 0.96 04/08/2024 1024   CALCIUM 9.4 04/08/2024 1024   GFRNONAA >60 10/03/2020 1347   GFRAA >60 10/31/2016 1530   Lab Results  Component Value Date   HGBA1C 6.1 (H) 04/08/2024   HGBA1C 6.1 02/24/2014   Lab Results  Component Value Date   INSULIN  26.6 (H) 04/08/2024   Lab Results  Component Value Date   TSH 1.21 03/17/2024   CBC    Component Value Date/Time   WBC 9.7 04/08/2024 1024   WBC 10.5 09/10/2023 1532   RBC 5.23 04/08/2024 1024   RBC 5.27 (H) 09/10/2023 1532   HGB 13.4 04/08/2024 1024   HCT 42.6 04/08/2024 1024   PLT 377 04/08/2024 1024   MCV 82 04/08/2024 1024   MCH 25.6 (L) 04/08/2024 1024   MCH 26.4 10/03/2020 1347    MCHC 31.5 04/08/2024 1024   MCHC 32.3 09/10/2023 1532   RDW 14.2 04/08/2024 1024   Iron Studies No results found for: IRON, TIBC, FERRITIN, IRONPCTSAT Lipid Panel     Component Value Date/Time   CHOL 131 04/08/2024 1024   TRIG 150 (H) 04/08/2024 1024   HDL 43 04/08/2024 1024   CHOLHDL 4 10/05/2021 0953   VLDL 28.0 10/05/2021 0953   LDLCALC 62 04/08/2024 1024   LDLDIRECT 57.0 03/27/2017 1511   Hepatic Function Panel     Component Value Date/Time   PROT 7.0 04/08/2024 1024   ALBUMIN 4.2 04/08/2024 1024   AST 25 04/08/2024 1024   ALT 24 04/08/2024 1024   ALKPHOS 74 04/08/2024 1024   BILITOT 0.3 04/08/2024 1024   BILIDIR 0.1 03/18/2023 1614      Component Value Date/Time   TSH 1.21 03/17/2024 1119   Nutritional Lab Results  Component Value Date   VD25OH 53.1 04/08/2024   VD25OH 36.31 04/08/2019  Medications: Outpatient Encounter Medications as of 05/13/2024  Medication Sig   albuterol  (VENTOLIN  HFA) 108 (90 Base) MCG/ACT inhaler Inhale 2 puffs into the lungs every 4 (four) hours as needed for wheezing or shortness of breath.   diclofenac  (VOLTAREN ) 75 MG EC tablet Take 1 tablet (75 mg total) by mouth 2 (two) times daily.   FIBER-VITAMINS-MINERALS PO Take by mouth.   fluocinonide -emollient (LIDEX -E) 0.05 % cream APPLY TO AFFECTED AREA TWICE A DAY   fluticasone (FLONASE) 50 MCG/ACT nasal spray Place 2 sprays into the nose as needed for allergies.   ibuprofen  (ADVIL ) 800 MG tablet TAKE 1 TABLET BY MOUTH EVERY 8 HOURS AS NEEDED   levocetirizine (XYZAL ) 5 MG tablet TAKE 1 TABLET BY MOUTH EVERY DAY IN THE EVENING   metFORMIN  (GLUCOPHAGE -XR) 500 MG 24 hr tablet Take 1 tablet (500 mg total) by mouth 2 (two) times daily with a meal.   methimazole  (TAPAZOLE ) 5 MG tablet Take 1 tablet (5 mg total) by mouth as directed. 1 tablet Monday through Saturday and none on Sundays   montelukast  (SINGULAIR ) 10 MG tablet TAKE 1 TABLET BY MOUTH EVERYDAY AT BEDTIME   Multiple Vitamin  (MULTIVITAMIN) tablet Take 1 tablet by mouth daily.   norgestimate -ethinyl estradiol  (ORTHO-CYCLEN) 0.25-35 MG-MCG tablet Take 1 tablet by mouth daily.   nystatin -triamcinolone  ointment (MYCOLOG) Apply 1 Application topically as needed.   pantoprazole  (PROTONIX ) 40 MG tablet TAKE 1 TABLET BY MOUTH TWICE A DAY   triamterene -hydrochlorothiazide  (DYAZIDE) 37.5-25 MG capsule TAKE 1 EACH (1 CAPSULE TOTAL) BY MOUTH DAILY.   Trospium  Chloride 60 MG CP24 Take 1 capsule (60 mg total) by mouth daily.   [DISCONTINUED] dexlansoprazole  (DEXILANT ) 60 MG capsule Take 1 capsule (60 mg total) by mouth daily.   [DISCONTINUED] promethazine -dextromethorphan (PROMETHAZINE -DM) 6.25-15 MG/5ML syrup Take 5 mLs by mouth 4 (four) times daily as needed.   No facility-administered encounter medications on file as of 05/13/2024.     Follow-Up   Return in about 4 weeks (around 06/10/2024) for For Weight Mangement with Dr. [email or web address] Alicia Hoover was informed of the importance of frequent follow up visits to maximize Alicia Hoover success with intensive lifestyle modifications for Alicia Hoover multiple health conditions.  Attestation Statement   Reviewed by clinician on day of visit: allergies, medications, problem list, medical history, surgical history, family history, social history, and previous encounter notes.     Lucas Francyne, MD

## 2024-05-13 NOTE — Assessment & Plan Note (Signed)
 Most recent A1c is  Lab Results  Component Value Date   HGBA1C 6.1 (H) 04/08/2024   HGBA1C 6.1 02/24/2014    Patient aware of disease state and risk of progression. This may contribute to abnormal cravings, fatigue and diabetic complications without having diabetes.   We have discussed treatment options which include: losing 7 to 10% of body weight, increasing physical activity to a goal of 150 minutes a week at moderate intensity.  Advised to maintain a diet low on simple and processed carbohydrates.  She is now on metformin  at 1000 mg a day but is having side effects.  We discussed taking the 2 pills in the morning if still has intolerance reduced to 1 a day.  She is also interested in starting Zepbound  so we may discontinue metformin .

## 2024-05-13 NOTE — Assessment & Plan Note (Signed)
 Obesity with prediabetes, insulin  resistance, and moderate obstructive sleep apnea Obesity with associated prediabetes, insulin  resistance, and moderate obstructive sleep apnea. She has lost a total of 12 pounds since starting the weight management program, with a recent loss of 9 pounds. She is adhering to a 1500 calorie nutrition plan, increasing physical activity, and has improved her body composition with a decrease in body fat percentage and an increase in muscle mass. Metformin  is being used to manage insulin  resistance, but she experiences gastrointestinal side effects with the second dose. There is a concern about the long-term use of metformin  and its effects on insulin  production, which was clarified as a misconception. Metformin  reduces the risk of diabetes and helps manage insulin  resistance. Discussed the potential use of GLP-1 receptor agonists, such as Zepbound , for weight management, especially given her moderate sleep apnea and associated conditions. Insurance coverage and prerequisites for GLP-1 therapy were discussed, with a plan to explore this further in the next visit. - Continue 1500 calorie nutrition plan and regular physical activity. - Continue metformin  500 mg once daily; consider taking two pills with breakfast if tolerated. - Discuss GLP-1 receptor agonist therapy (e.g., Zepbound ) at the next visit, including insurance coverage and prerequisites. - Schedule follow-up in four weeks to discuss medication options and insurance prerequisites.

## 2024-05-15 ENCOUNTER — Other Ambulatory Visit (INDEPENDENT_AMBULATORY_CARE_PROVIDER_SITE_OTHER): Payer: Self-pay | Admitting: Internal Medicine

## 2024-05-15 DIAGNOSIS — R7303 Prediabetes: Secondary | ICD-10-CM

## 2024-05-15 DIAGNOSIS — E88819 Insulin resistance, unspecified: Secondary | ICD-10-CM

## 2024-06-04 ENCOUNTER — Other Ambulatory Visit: Payer: Self-pay | Admitting: Internal Medicine

## 2024-06-04 DIAGNOSIS — L239 Allergic contact dermatitis, unspecified cause: Secondary | ICD-10-CM

## 2024-06-04 DIAGNOSIS — J301 Allergic rhinitis due to pollen: Secondary | ICD-10-CM

## 2024-06-12 ENCOUNTER — Other Ambulatory Visit: Payer: Self-pay

## 2024-06-12 DIAGNOSIS — Z3041 Encounter for surveillance of contraceptive pills: Secondary | ICD-10-CM

## 2024-06-12 MED ORDER — NORGESTIMATE-ETH ESTRADIOL 0.25-35 MG-MCG PO TABS: 1.0000 | ORAL_TABLET | Freq: Every day | ORAL | 0 refills | Status: AC

## 2024-06-14 ENCOUNTER — Other Ambulatory Visit (INDEPENDENT_AMBULATORY_CARE_PROVIDER_SITE_OTHER): Payer: Self-pay | Admitting: Internal Medicine

## 2024-06-14 DIAGNOSIS — R7303 Prediabetes: Secondary | ICD-10-CM

## 2024-06-14 DIAGNOSIS — E88819 Insulin resistance, unspecified: Secondary | ICD-10-CM

## 2024-06-18 ENCOUNTER — Ambulatory Visit (INDEPENDENT_AMBULATORY_CARE_PROVIDER_SITE_OTHER): Admitting: Internal Medicine

## 2024-06-23 ENCOUNTER — Encounter (INDEPENDENT_AMBULATORY_CARE_PROVIDER_SITE_OTHER): Payer: Self-pay | Admitting: Internal Medicine

## 2024-06-23 ENCOUNTER — Ambulatory Visit (INDEPENDENT_AMBULATORY_CARE_PROVIDER_SITE_OTHER): Admitting: Internal Medicine

## 2024-06-23 VITALS — BP 137/89 | HR 75 | Temp 98.2°F | Ht 65.0 in | Wt 302.0 lb

## 2024-06-23 DIAGNOSIS — G4733 Obstructive sleep apnea (adult) (pediatric): Secondary | ICD-10-CM | POA: Diagnosis not present

## 2024-06-23 DIAGNOSIS — E88819 Insulin resistance, unspecified: Secondary | ICD-10-CM

## 2024-06-23 DIAGNOSIS — I10 Essential (primary) hypertension: Secondary | ICD-10-CM | POA: Diagnosis not present

## 2024-06-23 DIAGNOSIS — Z6841 Body Mass Index (BMI) 40.0 and over, adult: Secondary | ICD-10-CM

## 2024-06-23 DIAGNOSIS — E66813 Obesity, class 3: Secondary | ICD-10-CM | POA: Diagnosis not present

## 2024-06-23 DIAGNOSIS — R7303 Prediabetes: Secondary | ICD-10-CM | POA: Diagnosis not present

## 2024-06-23 MED ORDER — METFORMIN HCL ER 500 MG PO TB24: 500.0000 mg | ORAL_TABLET | Freq: Two times a day (BID) | ORAL | 0 refills | Status: DC

## 2024-06-23 NOTE — Assessment & Plan Note (Signed)
 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 Optimal level < 1.9.   This is complex condition associated with genetics, ectopic fat and lifestyle factors. Insulin  resistance may result in increased fat storage, inhibition of the breakdown of fat, cause fluctuations in blood sugar leading to energy crashes and increased cravings for sugary or high carb foods and cause metabolic slowdown making it difficult to lose weight.  This may result in additional weight gain and lead to pre-diabetes and diabetes if untreated. In addition, hyperinsulinemia increases cardiovascular risk, chronic inflammatory response and may increase the risk of obesity related malignancies.  Lab Results  Component Value Date   HGBA1C 6.1 (H) 04/08/2024   Lab Results  Component Value Date   INSULIN  26.6 (H) 04/08/2024   Lab Results  Component Value Date   GLUCOSE 91 04/08/2024   GLUCOSE 85 02/24/2014    We reviewed treatment options which include losing 7 to 10% of body weight, increasing volume of physical activity and maintaining a diet low in saturated fats and with a low glycemic load.  Patient has also been educated on the carb insulin  model of obesity.  Continue metformin  for pharmacoprophylaxis.

## 2024-06-23 NOTE — Progress Notes (Signed)
 Office: [phone]  /  Fax: [phone]  Weight Summary and Body Composition Analysis (BIA)  Vitals Temp: 98.2 F (36.8 C) BP: 137/89 Pulse Rate: 75 SpO2: 100 %   Anthropometric Measurements Height: 5' 5 (1.651 m) Weight: (!) 302 lb (137 kg) BMI (Calculated): 50.26 Weight at Last Visit: 310 lb Weight Lost Since Last Visit: 8 lb Weight Gained Since Last Visit: 0 lb Starting Weight: 322 lb Total Weight Loss (lbs): 20 lb (9.072 kg) Peak Weight: 330 lb   Body Composition  Body Fat %: 47.8 % Fat Mass (lbs): 144.6 lbs Muscle Mass (lbs): 150 lbs Total Body Water (lbs): 103.4 lbs Visceral Fat Rating : 17    RMR: 2102  Today's Visit #: 4  Starting Date: 04/08/24   Subjective   Chief Complaint: Obesity  Interval History Discussed the use of AI scribe software for clinical note transcription with the patient, who gave verbal consent to proceed.  History of Present Illness Alicia Hoover is a 48 year old female with hypertension, insulin  resistance, sleep apnea, and prediabetes who presents for medical weight management.  She has lost eight pounds since her last visit and follows a 1500 calorie nutrition plan 90% of the time. She tracks her caloric intake, consumes more whole foods, ensures adequate protein intake, and maintains hydration. She exercises three days a week for 30 minutes, incorporating squats and walking. She experiences improved appetite control and mindfulness regarding her eating habits.  Her hypertension is managed with medication, although she did not take her blood pressure medication this morning, as she usually takes it at work. She is on metformin  for insulin  resistance and prediabetes, taking both pills in the morning to minimize gastrointestinal side effects. Her last A1c was 6.1, and insulin  levels were 26.  She has moderate sleep apnea and uses a CPAP machine nightly. She has made significant dietary changes, reducing carbohydrate intake  and increasing protein and fiber. Her taste preferences have shifted, finding sweet foods overly sweet, and she regularly drinks water, occasionally consuming zero-calorie drinks.     Challenges affecting patient progress: none.    Pharmacotherapy for weight management: She is currently taking Metformin  (off label use for weight management and / or insulin  resistance and / or diabetes prevention) with adequate clinical response  and without side effects..   Assessment and Plan   Treatment Plan For Obesity:  Recommended Dietary Goals  Syrai is currently in the action stage of change. As such, her goal is to continue weight management plan. She has agreed to: continue current plan  Behavioral Health and Counseling  We discussed the following behavioral modification strategies today: continue to work on maintaining a reduced calorie state, getting the recommended amount of protein, incorporating whole foods, making healthy choices, staying well hydrated and practicing mindfulness when eating. and increase protein intake, fibrous foods (25 grams per day for women, 30 grams for men) and water to improve satiety and decrease hunger signals. .  Additional education and resources provided today: None  Recommended Physical Activity Goals  Skarleth has been advised to work up to 150 minutes of moderate intensity aerobic activity a week and strengthening exercises 2-3 times per week for cardiovascular health, weight loss maintenance and preservation of muscle mass.  She has agreed to :  Increase volume of physical activity to a goal of 240 minutes a week and Combine aerobic and strengthening exercises for efficiency and improved cardiometabolic health.  Medical Interventions and Pharmacotherapy  We discussed various medication options  to help Syndey with her weight loss efforts and we both agreed to : Continue with current nutritional and behavioral strategies  Associated Conditions Impacted  by Obesity Treatment  Assessment & Plan Prediabetes Most recent A1c is  Lab Results  Component Value Date   HGBA1C 6.1 (H) 04/08/2024   HGBA1C 6.1 02/24/2014    Patient aware of disease state and risk of progression. This may contribute to abnormal cravings, fatigue and diabetic complications without having diabetes.   We have discussed treatment options which include: losing 7 to 10% of body weight, increasing physical activity to a goal of 150 minutes a week at moderate intensity.  Advised to maintain a diet low on simple and processed carbohydrates.  Currently on metformin  XR 500 mg 2 tablets in the morning without any adverse effects.  Continue medication along with nutritional behavioral strategies  Insulin  resistance Her HOMA-IR is 5.84 which is elevated. Optimal level < 1.9.   This is complex condition associated with genetics, ectopic fat and lifestyle factors. Insulin  resistance may result in increased fat storage, inhibition of the breakdown of fat, cause fluctuations in blood sugar leading to energy crashes and increased cravings for sugary or high carb foods and cause metabolic slowdown making it difficult to lose weight.  This may result in additional weight gain and lead to pre-diabetes and diabetes if untreated. In addition, hyperinsulinemia increases cardiovascular risk, chronic inflammatory response and may increase the risk of obesity related malignancies.  Lab Results  Component Value Date   HGBA1C 6.1 (H) 04/08/2024   Lab Results  Component Value Date   INSULIN  26.6 (H) 04/08/2024   Lab Results  Component Value Date   GLUCOSE 91 04/08/2024   GLUCOSE 85 02/24/2014    We reviewed treatment options which include losing 7 to 10% of body weight, increasing volume of physical activity and maintaining a diet low in saturated fats and with a low glycemic load.  Patient has also been educated on the carb insulin  model of obesity.  Continue metformin  for  pharmacoprophylaxis.  OSA (obstructive sleep apnea) - on CPAP - moderate OSA I reviewed sleep study she has moderate sleep apnea with significant oxygen desaturations.  She reports good compliance with CPAP treatment.  Losing 15% of body weight may reduce AHI.  Continue current weight management strategy Essential hypertension, benign  Class 3 severe obesity with serious comorbidity and body mass index (BMI) of 50.0 to 59.9 in adult, unspecified obesity type Obesity with associated hypertension, insulin  resistance, prediabetes, and obstructive sleep apnea Significant weight loss of 35 pounds since April, with 30 pounds lost since July, indicating a rapid weight loss trajectory. Current weight loss exceeds the recommended 1-2 pounds per week.  Overall preservation of muscle mass.  Blood pressure today was elevated at 137/89 mmHg, possibly due to missed medication dose. Insulin  resistance and prediabetes are managed with metformin , which is well-tolerated with some gastrointestinal side effects. Current dietary and exercise regimen is effective, focusing on reducing carbohydrate intake and increasing protein and fiber consumption. GLP-1 agonists were discussed but deferred due to current successful weight loss and high copay costs. For every two pounds lost, blood pressure is expected to decrease by about one point. - Continue current 1500 calorie nutrition plan with focus on whole foods, protein, and fiber. - Continue metformin , 2 tablets in the morning, and refill prescription at [facility]. - Monitor blood pressure in the morning and before bedtime; report to Dr. Joshua if consistently above 130/80 mmHg. - Increase physical activity  to 240 minutes per week, including strength training. - Reassess insulin  levels and A1c in approximately two months. - Consider GLP-1 agonists if weight loss plateaus or hunger increases.         Objective   Physical Exam:  Blood pressure 137/89, pulse 75, temperature  98.2 F (36.8 C), height 5' 5 (1.651 m), weight (!) 302 lb (137 kg), last menstrual period 06/12/2024, SpO2 100%. Body mass index is 50.26 kg/m.  General: She is overweight, cooperative, alert, well developed, and in no acute distress. PSYCH: Has normal mood, affect and thought process.   HEENT: EOMI, sclerae are anicteric. Lungs: Normal breathing effort, no conversational dyspnea. Extremities: No edema.  Neurologic: No gross sensory or motor deficits. No tremors or fasciculations noted.    Diagnostic Data Reviewed:  BMET    Component Value Date/Time   NA 139 04/08/2024 1024   K 3.8 04/08/2024 1024   CL 99 04/08/2024 1024   CO2 20 04/08/2024 1024   GLUCOSE 91 04/08/2024 1024   GLUCOSE 92 09/10/2023 1532   BUN 13 04/08/2024 1024   CREATININE 0.96 04/08/2024 1024   CALCIUM 9.4 04/08/2024 1024   GFRNONAA >60 10/03/2020 1347   GFRAA >60 10/31/2016 1530   Lab Results  Component Value Date   HGBA1C 6.1 (H) 04/08/2024   HGBA1C 6.1 02/24/2014   Lab Results  Component Value Date   INSULIN  26.6 (H) 04/08/2024   Lab Results  Component Value Date   TSH 1.21 03/17/2024   CBC    Component Value Date/Time   WBC 9.7 04/08/2024 1024   WBC 10.5 09/10/2023 1532   RBC 5.23 04/08/2024 1024   RBC 5.27 (H) 09/10/2023 1532   HGB 13.4 04/08/2024 1024   HCT 42.6 04/08/2024 1024   PLT 377 04/08/2024 1024   MCV 82 04/08/2024 1024   MCH 25.6 (L) 04/08/2024 1024   MCH 26.4 10/03/2020 1347   MCHC 31.5 04/08/2024 1024   MCHC 32.3 09/10/2023 1532   RDW 14.2 04/08/2024 1024   Iron Studies No results found for: IRON, TIBC, FERRITIN, IRONPCTSAT Lipid Panel     Component Value Date/Time   CHOL 131 04/08/2024 1024   TRIG 150 (H) 04/08/2024 1024   HDL 43 04/08/2024 1024   CHOLHDL 4 10/05/2021 0953   VLDL 28.0 10/05/2021 0953   LDLCALC 62 04/08/2024 1024   LDLDIRECT 57.0 03/27/2017 1511   Hepatic Function Panel     Component Value Date/Time   PROT 7.0 04/08/2024 1024    ALBUMIN 4.2 04/08/2024 1024   AST 25 04/08/2024 1024   ALT 24 04/08/2024 1024   ALKPHOS 74 04/08/2024 1024   BILITOT 0.3 04/08/2024 1024   BILIDIR 0.1 03/18/2023 1614      Component Value Date/Time   TSH 1.21 03/17/2024 1119   Nutritional Lab Results  Component Value Date   VD25OH 53.1 04/08/2024   VD25OH 36.31 04/08/2019    Medications: Outpatient Encounter Medications as of 06/23/2024  Medication Sig   albuterol  (VENTOLIN  HFA) 108 (90 Base) MCG/ACT inhaler Inhale 2 puffs into the lungs every 4 (four) hours as needed for wheezing or shortness of breath.   diclofenac  (VOLTAREN ) 75 MG EC tablet Take 1 tablet (75 mg total) by mouth 2 (two) times daily.   FIBER-VITAMINS-MINERALS PO Take by mouth.   fluocinonide -emollient (LIDEX -E) 0.05 % cream APPLY TO AFFECTED AREA TWICE A DAY   fluticasone (FLONASE) 50 MCG/ACT nasal spray Place 2 sprays into the nose as needed for allergies.   ibuprofen  (ADVIL )  800 MG tablet TAKE 1 TABLET BY MOUTH EVERY 8 HOURS AS NEEDED   levocetirizine (XYZAL ) 5 MG tablet TAKE 1 TABLET BY MOUTH EVERY DAY IN THE EVENING   methimazole  (TAPAZOLE ) 5 MG tablet Take 1 tablet (5 mg total) by mouth as directed. 1 tablet Monday through Saturday and none on Sundays   montelukast  (SINGULAIR ) 10 MG tablet TAKE 1 TABLET BY MOUTH EVERYDAY AT BEDTIME   Multiple Vitamin (MULTIVITAMIN) tablet Take 1 tablet by mouth daily.   norgestimate -ethinyl estradiol  (ORTHO-CYCLEN) 0.25-35 MG-MCG tablet Take 1 tablet by mouth daily. Please schedule annual exam for more refills   nystatin -triamcinolone  ointment (MYCOLOG) Apply 1 Application topically as needed.   pantoprazole  (PROTONIX ) 40 MG tablet TAKE 1 TABLET BY MOUTH TWICE A DAY   triamterene -hydrochlorothiazide  (DYAZIDE) 37.5-25 MG capsule TAKE 1 EACH (1 CAPSULE TOTAL) BY MOUTH DAILY.   Trospium  Chloride 60 MG CP24 Take 1 capsule (60 mg total) by mouth daily.   [DISCONTINUED] metFORMIN  (GLUCOPHAGE -XR) 500 MG 24 hr tablet TAKE 1 TABLET BY  MOUTH 2 TIMES DAILY WITH A MEAL.   metFORMIN  (GLUCOPHAGE -XR) 500 MG 24 hr tablet Take 1 tablet (500 mg total) by mouth 2 (two) times daily with a meal.   [DISCONTINUED] dexlansoprazole  (DEXILANT ) 60 MG capsule Take 1 capsule (60 mg total) by mouth daily.   No facility-administered encounter medications on file as of 06/23/2024.     Follow-Up   Return in about 4 weeks (around 07/21/2024) for For Weight Mangement with Dr. Francyne.SABRA She was informed of the importance of frequent follow up visits to maximize her success with intensive lifestyle modifications for her multiple health conditions.  Attestation Statement   Reviewed by clinician on day of visit: allergies, medications, problem list, medical history, surgical history, family history, social history, and previous encounter notes.     Lucas Francyne, MD

## 2024-06-23 NOTE — Assessment & Plan Note (Signed)
 I reviewed sleep study she has moderate sleep apnea with significant oxygen desaturations.  She reports good compliance with CPAP treatment.  Losing 15% of body weight may reduce AHI.  Continue current weight management strategy

## 2024-06-23 NOTE — Assessment & Plan Note (Signed)
 Obesity with associated hypertension, insulin  resistance, prediabetes, and obstructive sleep apnea Significant weight loss of 35 pounds since April, with 30 pounds lost since July, indicating a rapid weight loss trajectory. Current weight loss exceeds the recommended 1-2 pounds per week.  Overall preservation of muscle mass.  Blood pressure today was elevated at 137/89 mmHg, possibly due to missed medication dose. Insulin  resistance and prediabetes are managed with metformin , which is well-tolerated with some gastrointestinal side effects. Current dietary and exercise regimen is effective, focusing on reducing carbohydrate intake and increasing protein and fiber consumption. GLP-1 agonists were discussed but deferred due to current successful weight loss and high copay costs. For every two pounds lost, blood pressure is expected to decrease by about one point. - Continue current 1500 calorie nutrition plan with focus on whole foods, protein, and fiber. - Continue metformin , 2 tablets in the morning, and [facility]ill [facility] at [facility]. - Monitor blood pressure in the morning and before bedtime; report to Dr. Joshua if consistently above 130/80 mmHg. - Increase physical activity to 240 minutes per week, including strength training. - Reassess insulin  levels and A1c in approximately two months. - Consider GLP-1 agonists if weight loss plateaus or hunger increases.

## 2024-06-23 NOTE — Assessment & Plan Note (Signed)
 Most recent A1c is  Lab Results  Component Value Date   HGBA1C 6.1 (H) 04/08/2024   HGBA1C 6.1 02/24/2014    Patient aware of disease state and risk of progression. This may contribute to abnormal cravings, fatigue and diabetic complications without having diabetes.   We have discussed treatment options which include: losing 7 to 10% of body weight, increasing physical activity to a goal of 150 minutes a week at moderate intensity.  Advised to maintain a diet low on simple and processed carbohydrates.  Currently on metformin  XR 500 mg 2 tablets in the morning without any adverse effects.  Continue medication along with nutritional behavioral strategies

## 2024-07-10 ENCOUNTER — Ambulatory Visit: Admitting: Obstetrics and Gynecology

## 2024-07-18 ENCOUNTER — Other Ambulatory Visit (INDEPENDENT_AMBULATORY_CARE_PROVIDER_SITE_OTHER): Payer: Self-pay | Admitting: Internal Medicine

## 2024-07-18 DIAGNOSIS — E88819 Insulin resistance, unspecified: Secondary | ICD-10-CM

## 2024-07-18 DIAGNOSIS — R7303 Prediabetes: Secondary | ICD-10-CM

## 2024-07-20 ENCOUNTER — Ambulatory Visit: Admitting: Internal Medicine

## 2024-07-21 ENCOUNTER — Ambulatory Visit (INDEPENDENT_AMBULATORY_CARE_PROVIDER_SITE_OTHER): Admitting: Internal Medicine

## 2024-07-21 VITALS — BP 138/87 | HR 73 | Temp 98.1°F | Ht 65.0 in | Wt 298.0 lb

## 2024-07-21 DIAGNOSIS — I10 Essential (primary) hypertension: Secondary | ICD-10-CM

## 2024-07-21 DIAGNOSIS — E66813 Obesity, class 3: Secondary | ICD-10-CM

## 2024-07-21 DIAGNOSIS — R7303 Prediabetes: Secondary | ICD-10-CM

## 2024-07-21 DIAGNOSIS — E88819 Insulin resistance, unspecified: Secondary | ICD-10-CM

## 2024-07-21 DIAGNOSIS — Z6841 Body Mass Index (BMI) 40.0 and over, adult: Secondary | ICD-10-CM

## 2024-07-21 DIAGNOSIS — G4733 Obstructive sleep apnea (adult) (pediatric): Secondary | ICD-10-CM

## 2024-07-21 MED ORDER — METFORMIN HCL ER 500 MG PO TB24: 500.0000 mg | ORAL_TABLET | Freq: Two times a day (BID) | ORAL | 0 refills | Status: DC

## 2024-07-21 NOTE — Assessment & Plan Note (Signed)
 She has moderate obstructive sleep apnea with significant oxygen desaturation events. She uses her CPAP machine two to three times a week, which is insufficient for optimal management. Explained the serious health implications of untreated sleep apnea, including its impact on blood pressure, risk of sudden death, heart rhythm problems, and its effect on weight loss efforts. Emphasized the importance of consistent CPAP use to improve sleep quality and overall health. - Encourage consistent use of CPAP machine every night

## 2024-07-21 NOTE — Assessment & Plan Note (Signed)
 Blood pressure was recorded at 138/87 mmHg, slightly elevated. She is on triamterene  chlorothiazide but had not taken her medication before the appointment. Suspect that untreated sleep apnea may be contributing to her elevated blood pressure. Advised maintaining blood pressure closer to 130/80 mmHg or less. - Continue current antihypertensive medication regimen - Encourage adherence to medication schedule - Monitor blood pressure regularly -Her blood pressure should improve as she continues to lose weight

## 2024-07-21 NOTE — Progress Notes (Signed)
 Office: [phone]  /  Fax: [phone]  Weight Summary and Body Composition Analysis (BIA)  Vitals Temp: 98.1 F (36.7 C) BP: 138/87 Pulse Rate: 73 SpO2: 98 %   Anthropometric Measurements Height: 5' 5 (1.651 m) Weight: 298 lb (135.2 kg) BMI (Calculated): 49.59 Weight at Last Visit: 302 lb Weight Lost Since Last Visit: 4 lb Weight Gained Since Last Visit: 0 lb Starting Weight: 322 lb Total Weight Loss (lbs): 24 lb (10.9 kg) Peak Weight: 330 lb   Body Composition  Body Fat %: 52 % Fat Mass (lbs): 155 lbs Muscle Mass (lbs): 136 lbs Total Body Water (lbs): 103 lbs Visceral Fat Rating : 19    RMR: 2102  Today's Visit #: 5  Starting Date: 04/08/24   Subjective   Chief Complaint: Obesity  Interval History Discussed the use of AI scribe software for clinical note transcription with the patient, who gave verbal consent to proceed.  History of Present Illness Alicia Hoover is a 48 year old female who presents for medical weight management.  She has lost four pounds since her last visit and approximately 40 pounds over the past six months, which is about 12% of her body weight. She follows a 1500 calorie nutrition plan 80% of the time, tracks her intake, consumes more whole foods, ensures adequate protein intake, and maintains hydration. Her goal is to lose 50 more pounds by her 49th birthday. She exercises five days a week for 30 to 45 minutes and participates in 30-day challenges for motivation.  She is currently taking metformin , two tablets in the morning, and has experienced improved appetite control. She uses a CPAP machine two to three times a week but struggles with consistent use due to seasonal allergies affecting her sinuses, causing panic when she cannot breathe through her nose.  She is on triamterene  chlorothiazide but had not taken her blood pressure medication this morning. She usually takes it with her morning meal along with metformin .  Previous blood pressures have been close to target levels.  She reports difficulty with grocery expenses but tries to make healthy choices, including using protein shakes in the morning for convenience. She incorporates protein powder into her diet and engages in physical activities like squats and using weights, despite some knee pain that limits her activities.     Challenges affecting patient progress: orthopedic problems, medical conditions or chronic pain affecting mobility and inadequate sleep .    Pharmacotherapy for weight management: She is currently taking Metformin  (off label use for weight management and / or insulin  resistance and / or diabetes prevention) with adequate clinical response  and without side effects..   Assessment and Plan   Treatment Plan For Obesity:  Recommended Dietary Goals  Alicia Hoover is currently in the action stage of change. As such, her goal is to continue weight management plan. She has agreed to: continue current plan  Behavioral Health and Counseling  We discussed the following behavioral modification strategies today: continue to work on maintaining a reduced calorie state, getting the recommended amount of protein, incorporating whole foods, making healthy choices, staying well hydrated and practicing mindfulness when eating. and increase protein intake, fibrous foods (25 grams per day for women, 30 grams for men) and water to improve satiety and decrease hunger signals. .  Additional education and resources provided today: None  Recommended Physical Activity Goals  Alicia Hoover has been advised to work up to 150 minutes of moderate intensity aerobic activity a week and strengthening exercises 2-3 times  per week for cardiovascular health, weight loss maintenance and preservation of muscle mass.  She has agreed to :  Think about enjoyable ways to increase daily physical activity and overcoming barriers to exercise, Increase physical activity in their day  and reduce sedentary time (increase NEAT)., Increase volume of physical activity to a goal of 240 minutes a week, and Combine aerobic and strengthening exercises for efficiency and improved cardiometabolic health.  Medical Interventions and Pharmacotherapy  We discussed various medication options to help Alicia Hoover with her weight loss efforts and we both agreed to : Adequate clinical response to anti-obesity medication, continue current regimen  Associated Conditions Impacted by Obesity Treatment  Assessment & Plan Prediabetes Most recent A1c is  Lab Results  Component Value Date   HGBA1C 6.1 (H) 04/08/2024   HGBA1C 6.1 02/24/2014    Patient aware of disease state and risk of progression. This may contribute to abnormal cravings, fatigue and diabetic complications without having diabetes.   We have discussed treatment options which include: losing 7 to 10% of body weight, increasing physical activity to a goal of 150 minutes a week at moderate intensity.  Has also been educated on low carbohydrate insulin  model in the past.  Currently on metformin  XR 500 mg 2 tablets in the morning without any adverse effects.  Continue medication along with nutritional behavioral strategies  Check disease monitoring labs in January Insulin  resistance Her HOMA-IR is 5.84 which is elevated. Optimal level < 1.9.   This is complex condition associated with genetics, ectopic fat and lifestyle factors. Insulin  resistance may result in increased fat storage, inhibition of the breakdown of fat, cause fluctuations in blood sugar leading to energy crashes and increased cravings for sugary or high carb foods and cause metabolic slowdown making it difficult to lose weight.  This may result in additional weight gain and lead to pre-diabetes and diabetes if untreated. In addition, hyperinsulinemia increases cardiovascular risk, chronic inflammatory response and may increase the risk of obesity related  malignancies.  Lab Results  Component Value Date   HGBA1C 6.1 (H) 04/08/2024   Lab Results  Component Value Date   INSULIN  26.6 (H) 04/08/2024   Lab Results  Component Value Date   GLUCOSE 91 04/08/2024   GLUCOSE 85 02/24/2014    We reviewed treatment options which include losing 7 to 10% of body weight, increasing volume of physical activity and maintaining a diet low in saturated fats and with a low glycemic load.  Patient has also been educated on the carb insulin  model of obesity.  Continue metformin  for pharmacoprophylaxis.  Essential hypertension, benign Blood pressure was recorded at 138/87 mmHg, slightly elevated. She is on triamterene  chlorothiazide but had not taken her medication before the appointment. Suspect that untreated sleep apnea may be contributing to her elevated blood pressure. Advised maintaining blood pressure closer to 130/80 mmHg or less. - Continue current antihypertensive medication regimen - Encourage adherence to medication schedule - Monitor blood pressure regularly -Her blood pressure should improve as she continues to lose weight OSA (obstructive sleep apnea) She has moderate obstructive sleep apnea with significant oxygen desaturation events. She uses her CPAP machine two to three times a week, which is insufficient for optimal management. Explained the serious health implications of untreated sleep apnea, including its impact on blood pressure, risk of sudden death, heart rhythm problems, and its effect on weight loss efforts. Emphasized the importance of consistent CPAP use to improve sleep quality and overall health. - Encourage consistent use of CPAP  machine every night Class 3 severe obesity with serious comorbidity and body mass index (BMI) of 45.0 to 49.9 in adult, unspecified obesity type (HCC) Weight: decrease of 39.8 lb (11.8%) over 6 months, 1 week  Start: 01/10/2024 337 lb 12.8 oz (153.2 kg) (H)  End: 07/21/2024 298 lb (135.2 kg)  She has  lost four pounds since the last visit, totaling a weight loss of nearly 40 pounds over six months, approximately 12% of her body weight. She follows a 1500 calorie nutrition plan 80% of the time, consumes more whole foods, and maintains adequate hydration. She exercises five days a week for 30 to 45 minutes. Her goal is to lose 50 more pounds by her 49th birthday. Emphasized the importance of setting realistic goals to avoid disappointment and encouraged consistency over perfection. - Continue 1500 calorie nutrition plan - Continue exercise regimen five days a week - Encourage setting realistic weight loss goals - Continue metformin  for diabetes prevention and weight management -Increase adherence with CPAP treatment.      Objective   Physical Exam:  Blood pressure 138/87, pulse 73, temperature 98.1 F (36.7 C), height 5' 5 (1.651 m), weight 298 lb (135.2 kg), last menstrual period 07/07/2024, SpO2 98%. Body mass index is 49.59 kg/m.  General: She is overweight, cooperative, alert, well developed, and in no acute distress. PSYCH: Has normal mood, affect and thought process.   HEENT: EOMI, sclerae are anicteric. Lungs: Normal breathing effort, no conversational dyspnea. Extremities: No edema.  Neurologic: No gross sensory or motor deficits. No tremors or fasciculations noted.    Diagnostic Data Reviewed:  BMET    Component Value Date/Time   NA 139 04/08/2024 1024   K 3.8 04/08/2024 1024   CL 99 04/08/2024 1024   CO2 20 04/08/2024 1024   GLUCOSE 91 04/08/2024 1024   GLUCOSE 92 09/10/2023 1532   BUN 13 04/08/2024 1024   CREATININE 0.96 04/08/2024 1024   CALCIUM 9.4 04/08/2024 1024   GFRNONAA >60 10/03/2020 1347   GFRAA >60 10/31/2016 1530   Lab Results  Component Value Date   HGBA1C 6.1 (H) 04/08/2024   HGBA1C 6.1 02/24/2014   Lab Results  Component Value Date   INSULIN  26.6 (H) 04/08/2024   Lab Results  Component Value Date   TSH 1.21 03/17/2024   CBC     Component Value Date/Time   WBC 9.7 04/08/2024 1024   WBC 10.5 09/10/2023 1532   RBC 5.23 04/08/2024 1024   RBC 5.27 (H) 09/10/2023 1532   HGB 13.4 04/08/2024 1024   HCT 42.6 04/08/2024 1024   PLT 377 04/08/2024 1024   MCV 82 04/08/2024 1024   MCH 25.6 (L) 04/08/2024 1024   MCH 26.4 10/03/2020 1347   MCHC 31.5 04/08/2024 1024   MCHC 32.3 09/10/2023 1532   RDW 14.2 04/08/2024 1024   Iron Studies No results found for: IRON, TIBC, FERRITIN, IRONPCTSAT Lipid Panel     Component Value Date/Time   CHOL 131 04/08/2024 1024   TRIG 150 (H) 04/08/2024 1024   HDL 43 04/08/2024 1024   CHOLHDL 4 10/05/2021 0953   VLDL 28.0 10/05/2021 0953   LDLCALC 62 04/08/2024 1024   LDLDIRECT 57.0 03/27/2017 1511   Hepatic Function Panel     Component Value Date/Time   PROT 7.0 04/08/2024 1024   ALBUMIN 4.2 04/08/2024 1024   AST 25 04/08/2024 1024   ALT 24 04/08/2024 1024   ALKPHOS 74 04/08/2024 1024   BILITOT 0.3 04/08/2024 1024   BILIDIR 0.1  03/18/2023 1614      Component Value Date/Time   TSH 1.21 03/17/2024 1119   Nutritional Lab Results  Component Value Date   VD25OH 53.1 04/08/2024   VD25OH 36.31 04/08/2019    Medications: Outpatient Encounter Medications as of 07/21/2024  Medication Sig   albuterol  (VENTOLIN  HFA) 108 (90 Base) MCG/ACT inhaler Inhale 2 puffs into the lungs every 4 (four) hours as needed for wheezing or shortness of breath.   diclofenac  (VOLTAREN ) 75 MG EC tablet Take 1 tablet (75 mg total) by mouth 2 (two) times daily.   FIBER-VITAMINS-MINERALS PO Take by mouth.   fluocinonide -emollient (LIDEX -E) 0.05 % cream APPLY TO AFFECTED AREA TWICE A DAY   fluticasone (FLONASE) 50 MCG/ACT nasal spray Place 2 sprays into the nose as needed for allergies.   ibuprofen  (ADVIL ) 800 MG tablet TAKE 1 TABLET BY MOUTH EVERY 8 HOURS AS NEEDED   levocetirizine (XYZAL ) 5 MG tablet TAKE 1 TABLET BY MOUTH EVERY DAY IN THE EVENING   methimazole  (TAPAZOLE ) 5 MG tablet Take 1  tablet (5 mg total) by mouth as directed. 1 tablet Monday through Saturday and none on Sundays   montelukast  (SINGULAIR ) 10 MG tablet TAKE 1 TABLET BY MOUTH EVERYDAY AT BEDTIME   Multiple Vitamin (MULTIVITAMIN) tablet Take 1 tablet by mouth daily.   norgestimate -ethinyl estradiol  (ORTHO-CYCLEN) 0.25-35 MG-MCG tablet Take 1 tablet by mouth daily. Please schedule annual exam for more refills   nystatin -triamcinolone  ointment (MYCOLOG) Apply 1 Application topically as needed.   pantoprazole  (PROTONIX ) 40 MG tablet TAKE 1 TABLET BY MOUTH TWICE A DAY   triamterene -hydrochlorothiazide  (DYAZIDE) 37.5-25 MG capsule TAKE 1 EACH (1 CAPSULE TOTAL) BY MOUTH DAILY.   Trospium  Chloride 60 MG CP24 Take 1 capsule (60 mg total) by mouth daily.   [DISCONTINUED] metFORMIN  (GLUCOPHAGE -XR) 500 MG 24 hr tablet Take 1 tablet (500 mg total) by mouth 2 (two) times daily with a meal.   metFORMIN  (GLUCOPHAGE -XR) 500 MG 24 hr tablet Take 1 tablet (500 mg total) by mouth 2 (two) times daily with a meal.   [DISCONTINUED] dexlansoprazole  (DEXILANT ) 60 MG capsule Take 1 capsule (60 mg total) by mouth daily.   No facility-administered encounter medications on file as of 07/21/2024.     Follow-Up   Return in about 4 weeks (around 08/18/2024) for For Weight Mangement with Dr. Francyne.SABRA She was informed of the importance of frequent follow up visits to maximize her success with intensive lifestyle modifications for her multiple health conditions.  Attestation Statement   Reviewed by clinician on day of visit: allergies, medications, problem list, medical history, surgical history, family history, social history, and previous encounter notes.     Lucas Francyne, MD

## 2024-07-21 NOTE — Assessment & Plan Note (Signed)
 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 Optimal level < 1.9.   This is complex condition associated with genetics, ectopic fat and lifestyle factors. Insulin  resistance may result in increased fat storage, inhibition of the breakdown of fat, cause fluctuations in blood sugar leading to energy crashes and increased cravings for sugary or high carb foods and cause metabolic slowdown making it difficult to lose weight.  This may result in additional weight gain and lead to pre-diabetes and diabetes if untreated. In addition, hyperinsulinemia increases cardiovascular risk, chronic inflammatory response and may increase the risk of obesity related malignancies.  Lab Results  Component Value Date   HGBA1C 6.1 (H) 04/08/2024   Lab Results  Component Value Date   INSULIN  26.6 (H) 04/08/2024   Lab Results  Component Value Date   GLUCOSE 91 04/08/2024   GLUCOSE 85 02/24/2014    We reviewed treatment options which include losing 7 to 10% of body weight, increasing volume of physical activity and maintaining a diet low in saturated fats and with a low glycemic load.  Patient has also been educated on the carb insulin  model of obesity.  Continue metformin  for pharmacoprophylaxis.

## 2024-07-21 NOTE — Assessment & Plan Note (Signed)
 Most recent A1c is  Lab Results  Component Value Date   HGBA1C 6.1 (H) 04/08/2024   HGBA1C 6.1 02/24/2014    Patient aware of disease state and risk of progression. This may contribute to abnormal cravings, fatigue and diabetic complications without having diabetes.   We have discussed treatment options which include: losing 7 to 10% of body weight, increasing physical activity to a goal of 150 minutes a week at moderate intensity.  Has also been educated on low carbohydrate insulin  model in the past.  Currently on metformin  XR 500 mg 2 tablets in the morning without any adverse effects.  Continue medication along with nutritional behavioral strategies  Check disease monitoring labs in January

## 2024-07-21 NOTE — Assessment & Plan Note (Signed)
 Weight: decrease of 39.8 lb (11.8%) over 6 months, 1 week  Start: 01/10/2024 337 lb 12.8 oz (153.2 kg) (H)  End: 07/21/2024 298 lb (135.2 kg)  She has lost four pounds since the last visit, totaling a weight loss of nearly 40 pounds over six months, approximately 12% of her body weight. She follows a 1500 calorie nutrition plan 80% of the time, consumes more whole foods, and maintains adequate hydration. She exercises five days a week for 30 to 45 minutes. Her goal is to lose 50 more pounds by her 49th birthday. Emphasized the importance of setting realistic goals to avoid disappointment and encouraged consistency over perfection. - Continue 1500 calorie nutrition plan - Continue exercise regimen five days a week - Encourage setting realistic weight loss goals - Continue metformin  for diabetes prevention and weight management -Increase adherence with CPAP treatment.

## 2024-08-10 ENCOUNTER — Encounter: Payer: Self-pay | Admitting: Radiology

## 2024-08-18 ENCOUNTER — Ambulatory Visit (INDEPENDENT_AMBULATORY_CARE_PROVIDER_SITE_OTHER): Payer: Self-pay | Admitting: Internal Medicine

## 2024-08-27 ENCOUNTER — Encounter: Payer: Self-pay | Admitting: Obstetrics and Gynecology

## 2024-08-27 ENCOUNTER — Other Ambulatory Visit (HOSPITAL_COMMUNITY)
Admission: RE | Admit: 2024-08-27 | Discharge: 2024-08-27 | Disposition: A | Source: Ambulatory Visit | Attending: Obstetrics and Gynecology | Admitting: Obstetrics and Gynecology

## 2024-08-27 ENCOUNTER — Other Ambulatory Visit: Payer: Self-pay | Admitting: Obstetrics and Gynecology

## 2024-08-27 ENCOUNTER — Ambulatory Visit (INDEPENDENT_AMBULATORY_CARE_PROVIDER_SITE_OTHER): Admitting: Obstetrics and Gynecology

## 2024-08-27 VITALS — BP 133/82 | HR 69 | Ht 65.0 in | Wt 304.0 lb

## 2024-08-27 DIAGNOSIS — Z3041 Encounter for surveillance of contraceptive pills: Secondary | ICD-10-CM

## 2024-08-27 DIAGNOSIS — Z01419 Encounter for gynecological examination (general) (routine) without abnormal findings: Secondary | ICD-10-CM | POA: Diagnosis present

## 2024-08-27 MED ORDER — SLYND 4 MG PO TABS: 1.0000 | ORAL_TABLET | Freq: Every day | ORAL | 4 refills | Status: DC

## 2024-08-27 NOTE — Progress Notes (Addendum)
 ANNUAL EXAM Patient name: Alicia Hoover MRN [id number]  Date of birth: 1976/03/29 Chief Complaint:   Gynecologic Exam  History of Present Illness:   Alicia Hoover is a 48 y.o. [id number] African-American female being seen today for a routine annual exam. She is overall doing well and reports she has regular cycles with minimal cramping. She denies dyspareunia, dysuria, diarrhea, or constipation. She relays being on OCPs for the last 18 years since the birth of her last daughter. She has previously been interested in BTL and still expresses interest in that today.  Current complaints: None  Patient's last menstrual period was 07/30/2024 (exact date).   The pregnancy intention screening data noted above was reviewed. Potential methods of contraception were discussed. The patient elected to proceed with No data recorded.   Last pap 11/01/2021. Results were: NILM w/ HRHPV negative. H/O abnormal pap: yes Last mammogram: 09/2023. Results were: normal. Family h/o breast cancer: no Last colonoscopy: Never done. Results of cologuard were: normal in 10/2021. Family h/o colorectal cancer: no     08/27/2024   10:51 AM 04/08/2024    9:02 AM 05/27/2023    2:37 PM 05/27/2023    2:29 PM 11/12/2022   10:20 AM  Depression screen PHQ 2/9  Decreased Interest 0 0  0 0  Down, Depressed, Hopeless 0 0 0 0 0  PHQ - 2 Score 0 0 0 0 0  Altered sleeping 0 1  0   Tired, decreased energy 0 1 2 0   Change in appetite 0 0 2 2   Feeling bad or failure about yourself  0 0  0   Trouble concentrating 1 1  0   Moving slowly or fidgety/restless 0 0  0   Suicidal thoughts 0 0  0   PHQ-9 Score 1 3   2     Difficult doing work/chores Not difficult at all Somewhat difficult  Not difficult at all      Data saved with a previous flowsheet row definition        08/27/2024   10:51 AM 05/27/2023    2:30 PM 04/24/2019    8:24 AM 03/27/2017    4:32 PM  GAD 7 : Generalized Anxiety Score  Nervous, Anxious, on Edge 0 0 3 1   Control/stop worrying 0 0 3 1  Worry too much - different things 0 0 3 1  Trouble relaxing 0 0 3 1  Restless 0 0 3 0  Easily annoyed or irritable 0 0 3 1  Afraid - awful might happen 0 0 3 1  Total GAD 7 Score 0 0 21 6  Anxiety Difficulty Not difficult at all Not difficult at all Not difficult at all Not difficult at all     Review of Systems:   Pertinent items are noted in HPI Denies any headaches, blurred vision, fatigue, shortness of breath, chest pain, abdominal pain, abnormal vaginal discharge/itching/odor/irritation, problems with periods, bowel movements, urination, or intercourse unless otherwise stated above. Pertinent History Reviewed:  Reviewed past medical,surgical, social and family history.  Reviewed problem list, medications and allergies. Physical Assessment:   Vitals:   08/27/24 1045  BP: 133/82  Pulse: 69  Weight: (!) 304 lb (137.9 kg)  Height: 5' 5 (1.651 m)  Body mass index is 50.59 kg/m.        Physical Examination:   General appearance - well appearing, and in no distress  Mental status - alert, oriented to person, place, and time  Psych:  She has a normal mood and affect  Skin - warm and dry, normal color, no suspicious lesions noted  Chest - effort normal, all lung fields clear to auscultation bilaterally   Pelvic - VULVA: normal appearing vulva with no masses, tenderness or lesions  VAGINA: normal appearing vagina with normal color and discharge, no lesions  CERVIX: normal appearing cervix without discharge or lesions, no CMT  Thin prep pap is done with reflex HR HPV cotesting  Extremities:  No swelling or varicosities noted  Chaperone present for exam  No results found for this or any previous visit (from the past 24 hours).  Assessment & Plan:  1) Well-Woman Exam Ms. Aughenbaugh overall is doing well with regular periods with minimal cramping and no other concerns at this time. Pap collected and patient will be called with abnormal results.  - Pap  cytology  2) Contraception:  Given history of cHTN managed with medication, combined hormone contraception increases risk of cardiovascular event. Shared decision making used and patient amenable to started progesterone only pill rather than continuing with OCPs or pursuing BTL at this time.  - Drospiernone (Slynd) 4 mg tablets  Labs/procedures today: Pap cytology  Mammogram: on scheduled date 09/2024, or sooner if problems Colonoscopy: schedule screening colonoscopy as soon as possible, or sooner if problems  Orders Placed This Encounter  Procedures   MM 3D SCREENING MAMMOGRAM BILATERAL BREAST    Meds:  Meds ordered this encounter  Medications   Drospirenone (SLYND) 4 MG TABS    Sig: Take 1 tablet (4 mg total) by mouth daily.    Dispense:  84 tablet    Refill:  4    Follow-up: Return in 1 year for annual. If results are normal, may return for repeat PAP in 3 years  Brad Prey, Medical Student 08/27/2024 1:27 PM

## 2024-08-27 NOTE — Progress Notes (Signed)
 48 y.o. GYN presents for AEX.  Pt is interested in getting a BTS.  She is currently using OCP for [facility].   Last PAP01/25/2023  NILM Last Mammogram 09/12/23 Negative

## 2024-08-28 ENCOUNTER — Encounter: Payer: Self-pay | Admitting: Obstetrics and Gynecology

## 2024-08-28 LAB — CERVICOVAGINAL ANCILLARY ONLY
Chlamydia: NEGATIVE
Comment: NEGATIVE
Comment: NORMAL
Neisseria Gonorrhea: NEGATIVE

## 2024-08-29 ENCOUNTER — Encounter: Payer: Self-pay | Admitting: Obstetrics and Gynecology

## 2024-08-30 ENCOUNTER — Other Ambulatory Visit: Payer: Self-pay | Admitting: Obstetrics and Gynecology

## 2024-08-30 DIAGNOSIS — Z3041 Encounter for surveillance of contraceptive pills: Secondary | ICD-10-CM

## 2024-08-31 ENCOUNTER — Other Ambulatory Visit: Payer: Self-pay

## 2024-08-31 LAB — CYTOLOGY - PAP
Adequacy: ABSENT
Comment: NEGATIVE
Diagnosis: NEGATIVE
High risk HPV: NEGATIVE

## 2024-08-31 MED ORDER — SLYND 4 MG PO TABS
1.0000 | ORAL_TABLET | Freq: Every day | ORAL | 4 refills | Status: AC
Start: 2024-08-31 — End: ?

## 2024-09-09 ENCOUNTER — Other Ambulatory Visit: Payer: Self-pay | Admitting: Obstetrics and Gynecology

## 2024-09-09 DIAGNOSIS — N3281 Overactive bladder: Secondary | ICD-10-CM

## 2024-09-14 ENCOUNTER — Ambulatory Visit (INDEPENDENT_AMBULATORY_CARE_PROVIDER_SITE_OTHER): Admitting: Internal Medicine

## 2024-09-14 VITALS — BP 139/86 | HR 77 | Temp 98.2°F | Ht 65.0 in | Wt 296.0 lb

## 2024-09-14 DIAGNOSIS — E66813 Obesity, class 3: Secondary | ICD-10-CM

## 2024-09-14 DIAGNOSIS — I10 Essential (primary) hypertension: Secondary | ICD-10-CM

## 2024-09-14 DIAGNOSIS — Z6841 Body Mass Index (BMI) 40.0 and over, adult: Secondary | ICD-10-CM

## 2024-09-14 DIAGNOSIS — R7303 Prediabetes: Secondary | ICD-10-CM

## 2024-09-14 DIAGNOSIS — E88819 Insulin resistance, unspecified: Secondary | ICD-10-CM

## 2024-09-14 NOTE — Assessment & Plan Note (Signed)
 Weight: decrease of 41.8 lb (12.4%) over 8 months  Start: 01/10/2024 337 lb 12.8 oz (153.2 kg) (H)  End: 09/14/2024 296 lb (134.3 kg)   Continue current weight management plan.  Continue metformin  medication will be increased to 1000 mg twice a day.  Were checking hemoglobin A1c today

## 2024-09-14 NOTE — Assessment & Plan Note (Signed)
 Obesity and medical weight management She has lost 41 pounds over eight months and 2 pounds since the last visit. She follows a 1500 calorie nutrition plan and exercises 1-2 days a week for 30 minutes. Financial constraints affect her food choices, but she manages with affordable options like Healthy Choice and Progresso soups. She is satisfied with her appetite control and does not experience excessive hunger. She is considering increasing her metformin  dosage to support further weight loss. - Continue 1500 calorie nutrition plan. - Encouraged exploration of affordable protein sources such as Kevins, Amy's, Progresso soups, canned chicken, and tuna. - Encouraged consideration of chair exercises to accommodate heel spur and knee pain. - Increased metformin  to two tablets in the morning and one tablet in the evening for one week, then increase to two tablets twice a day if tolerated. - Monitor weight and adjust calorie intake as needed to avoid weight loss plateau.

## 2024-09-14 NOTE — Assessment & Plan Note (Signed)
 Her blood pressure is slightly above target at 139/86 mmHg. She is on Dyazide and has not been monitoring her blood pressure at home. Weight loss is expected to help lower her blood pressure. - Monitor blood pressure at home before breakfast and before bedtime, three to four days a week. - Will consider additional antihypertensive medication if blood pressure remains elevated. -Discussed goal blood pressure of less than 130/80

## 2024-09-14 NOTE — Progress Notes (Signed)
 Office: [phone]  /  Fax: [phone]  Weight Summary and Body Composition Analysis (BIA)  Vitals Temp: 98.2 F (36.8 C) BP: 139/86 Pulse Rate: 77 SpO2: 96 %   Anthropometric Measurements Height: 5' 5 (1.651 m) Weight: 296 lb (134.3 kg) BMI (Calculated): 49.26 Weight at Last Visit: 298 lb Weight Lost Since Last Visit: 2 lb Weight Gained Since Last Visit: 0 lb Starting Weight: 322 lb Total Weight Loss (lbs): 26 lb (11.8 kg) Peak Weight: 330 lb   Body Composition  Body Fat %: 50.4 % Fat Mass (lbs): 149.2 lbs Muscle Mass (lbs): 139.6 lbs Total Body Water (lbs): 101.8 lbs Visceral Fat Rating : 18    RMR: 2102  Today's Visit #: 6  Starting Date: 04/08/24   Subjective   Chief Complaint: Obesity  Interval History Discussed the use of AI scribe software for clinical note transcription with the patient, who gave verbal consent to proceed.  History of Present Illness Alicia DINKEL is a 48 year old female who presents for medical weight management.  She has lost a total of 41 pounds over the past eight months, including a two-pound loss since her last office visit. She follows a 1500 calorie nutrition plan about 50% of the time and exercises one to two days a week for 30 minutes. Financial constraints affect her ability to consistently follow her nutrition plan, leading her to purchase Healthy Choice frozen meals and consider other affordable options like Kevin's and Amy's meals. She is also contemplating making homemade soups with beans, lentils, and chickpeas to manage costs and maintain protein intake.  She experiences occasional hunger between meals but manages it with snacks like cheese sticks or yogurt. She feels satisfied after meals and has minimized her snacking.  Her physical activity is impacted by a heel spur and right knee pain. She is currently taking metformin  without side effects. She also takes triamterene  for blood pressure management but has  not checked her blood pressure at home recently.  She has moderate sleep apnea with significant oxygen desaturation and has previously encountered high costs for treatment options.  Her social history includes financial challenges related to supporting a senior in high school and preparing for college expenses.     Challenges affecting patient progress: low volume of physical activity at present .    Pharmacotherapy for weight management: She is currently taking Metformin  (off label use for weight management and / or insulin  resistance and / or diabetes prevention) with adequate clinical response , without side effects., and GLP-1's are cost prohibitive.   Assessment and Plan   Treatment Plan For Obesity:  Recommended Dietary Goals  Trishia is currently in the action stage of change. As such, her goal is to continue weight management plan. She has agreed to: follow the Category 3 plan - 1500 kcal per day  Behavioral Health and Counseling  We discussed the following behavioral modification strategies today: increasing lean protein intake to established goals, decreasing simple carbohydrates , work on meal planning and preparation, work on tracking and journaling calories using tracking application, continue to work on maintaining a reduced calorie state, getting the recommended amount of protein, incorporating whole foods, making healthy choices, staying well hydrated and practicing mindfulness when eating., and increase protein intake, fibrous foods (25 grams per day for women, 30 grams for men) and water to improve satiety and decrease hunger signals. .  Additional education and resources provided today: Handout on traveling and holiday eating strategies  Recommended Physical Activity  Goals  Kerria has been advised to work up to 150 minutes of moderate intensity aerobic activity a week and strengthening exercises 2-3 times per week for cardiovascular health, weight loss maintenance and  preservation of muscle mass.  She has agreed to :  Think about enjoyable ways to increase daily physical activity and overcoming barriers to exercise, Increase physical activity in their day and reduce sedentary time (increase NEAT)., Increase volume of physical activity to a goal of 240 minutes a week, and Combine aerobic and strengthening exercises for efficiency and improved cardiometabolic health.  Medical Interventions and Pharmacotherapy  We discussed various medication options to help Cathie with her weight loss efforts and we both agreed to : Increase metformin  to 1000 mg twice daily.  She will do this with current supply of medication, if tolerance is not an issue she will reach out to us  for a 90-day supply  Associated Conditions Impacted by Obesity Treatment  Assessment & Plan Prediabetes Insulin  resistance Weight: decrease of 41.8 lb (12.4%) over 8 months  Start: 01/10/2024 337 lb 12.8 oz (153.2 kg) (H)  End: 09/14/2024 296 lb (134.3 kg)   Continue current weight management plan.  Continue metformin  medication will be increased to 1000 mg twice a day.  Were checking hemoglobin A1c today Essential hypertension, benign Her blood pressure is slightly above target at 139/86 mmHg. She is on Dyazide and has not been monitoring her blood pressure at home. Weight loss is expected to help lower her blood pressure. - Monitor blood pressure at home before breakfast and before bedtime, three to four days a week. - Will consider additional antihypertensive medication if blood pressure remains elevated. -Discussed goal blood pressure of less than 130/80 Class 3 severe obesity with serious comorbidity and body mass index (BMI) of 45.0 to 49.9 in adult, unspecified obesity type (HCC) Obesity and medical weight management She has lost 41 pounds over eight months and 2 pounds since the last visit. She follows a 1500 calorie nutrition plan and exercises 1-2 days a week for 30 minutes. Financial  constraints affect her food choices, but she manages with affordable options like Healthy Choice and Progresso soups. She is satisfied with her appetite control and does not experience excessive hunger. She is considering increasing her metformin  dosage to support further weight loss. - Continue 1500 calorie nutrition plan. - Encouraged exploration of affordable protein sources such as Kevins, Amy's, Progresso soups, canned chicken, and tuna. - Encouraged consideration of chair exercises to accommodate heel spur and knee pain. - Increased metformin  to two tablets in the morning and one tablet in the evening for one week, then increase to two tablets twice a day if tolerated. - Monitor weight and adjust calorie intake as needed to avoid weight loss plateau.        Objective   Physical Exam:  Blood pressure 139/86, pulse 77, temperature 98.2 F (36.8 C), height 5' 5 (1.651 m), weight 296 lb (134.3 kg), last menstrual period 09/02/2024, SpO2 96%. Body mass index is 49.26 kg/m.  General: She is overweight, cooperative, alert, well developed, and in no acute distress. PSYCH: Has normal mood, affect and thought process.   HEENT: EOMI, sclerae are anicteric. Lungs: Normal breathing effort, no conversational dyspnea. Extremities: No edema.  Neurologic: No gross sensory or motor deficits. No tremors or fasciculations noted.    Diagnostic Data Reviewed:  BMET    Component Value Date/Time   NA 139 04/08/2024 1024   K 3.8 04/08/2024 1024   CL 99  04/08/2024 1024   CO2 20 04/08/2024 1024   GLUCOSE 91 04/08/2024 1024   GLUCOSE 92 09/10/2023 1532   BUN 13 04/08/2024 1024   CREATININE 0.96 04/08/2024 1024   CALCIUM 9.4 04/08/2024 1024   GFRNONAA >60 10/03/2020 1347   GFRAA >60 10/31/2016 1530   Lab Results  Component Value Date   HGBA1C 6.1 (H) 04/08/2024   HGBA1C 6.1 02/24/2014   Lab Results  Component Value Date   INSULIN  26.6 (H) 04/08/2024   Lab Results  Component Value Date    TSH 1.21 03/17/2024   CBC    Component Value Date/Time   WBC 9.7 04/08/2024 1024   WBC 10.5 09/10/2023 1532   RBC 5.23 04/08/2024 1024   RBC 5.27 (H) 09/10/2023 1532   HGB 13.4 04/08/2024 1024   HCT 42.6 04/08/2024 1024   PLT 377 04/08/2024 1024   MCV 82 04/08/2024 1024   MCH 25.6 (L) 04/08/2024 1024   MCH 26.4 10/03/2020 1347   MCHC 31.5 04/08/2024 1024   MCHC 32.3 09/10/2023 1532   RDW 14.2 04/08/2024 1024   Iron Studies No results found for: IRON, TIBC, FERRITIN, IRONPCTSAT Lipid Panel     Component Value Date/Time   CHOL 131 04/08/2024 1024   TRIG 150 (H) 04/08/2024 1024   HDL 43 04/08/2024 1024   CHOLHDL 4 10/05/2021 0953   VLDL 28.0 10/05/2021 0953   LDLCALC 62 04/08/2024 1024   LDLDIRECT 57.0 03/27/2017 1511   Hepatic Function Panel     Component Value Date/Time   PROT 7.0 04/08/2024 1024   ALBUMIN 4.2 04/08/2024 1024   AST 25 04/08/2024 1024   ALT 24 04/08/2024 1024   ALKPHOS 74 04/08/2024 1024   BILITOT 0.3 04/08/2024 1024   BILIDIR 0.1 03/18/2023 1614      Component Value Date/Time   TSH 1.21 03/17/2024 1119   Nutritional Lab Results  Component Value Date   VD25OH 53.1 04/08/2024   VD25OH 36.31 04/08/2019    Medications: Outpatient Encounter Medications as of 09/14/2024  Medication Sig   albuterol  (VENTOLIN  HFA) 108 (90 Base) MCG/ACT inhaler Inhale 2 puffs into the lungs every 4 (four) hours as needed for wheezing or shortness of breath.   diclofenac  (VOLTAREN ) 75 MG EC tablet Take 1 tablet (75 mg total) by mouth 2 (two) times daily.   Drospirenone  (SLYND ) 4 MG TABS Take 1 tablet (4 mg total) by mouth daily.   FIBER-VITAMINS-MINERALS PO Take by mouth.   fluocinonide -emollient (LIDEX -E) 0.05 % cream APPLY TO AFFECTED AREA TWICE A DAY   fluticasone (FLONASE) 50 MCG/ACT nasal spray Place 2 sprays into the nose as needed for allergies.   ibuprofen  (ADVIL ) 800 MG tablet TAKE 1 TABLET BY MOUTH EVERY 8 HOURS AS NEEDED   levocetirizine  (XYZAL ) 5 MG tablet TAKE 1 TABLET BY MOUTH EVERY DAY IN THE EVENING   metFORMIN  (GLUCOPHAGE -XR) 500 MG 24 hr tablet Take 1 tablet (500 mg total) by mouth 2 (two) times daily with a meal.   methimazole  (TAPAZOLE ) 5 MG tablet Take 1 tablet (5 mg total) by mouth as directed. 1 tablet Monday through Saturday and none on Sundays   montelukast  (SINGULAIR ) 10 MG tablet TAKE 1 TABLET BY MOUTH EVERYDAY AT BEDTIME   Multiple Vitamin (MULTIVITAMIN) tablet Take 1 tablet by mouth daily.   norgestimate -ethinyl estradiol  (ORTHO-CYCLEN) 0.25-35 MG-MCG tablet Take 1 tablet by mouth daily. Please schedule annual exam for more refills   nystatin -triamcinolone  ointment (MYCOLOG) Apply 1 Application topically as needed.   pantoprazole  (  PROTONIX ) 40 MG tablet TAKE 1 TABLET BY MOUTH TWICE A DAY   triamterene -hydrochlorothiazide  (DYAZIDE) 37.5-25 MG capsule TAKE 1 EACH (1 CAPSULE TOTAL) BY MOUTH DAILY.   Trospium  Chloride 60 MG CP24 Take 1 capsule (60 mg total) by mouth daily.   [DISCONTINUED] dexlansoprazole  (DEXILANT ) 60 MG capsule Take 1 capsule (60 mg total) by mouth daily.   No facility-administered encounter medications on file as of 09/14/2024.     Follow-Up   No follow-ups on file.SABRA She was informed of the importance of frequent follow up visits to maximize her success with intensive lifestyle modifications for her multiple health conditions.  Attestation Statement   Reviewed by clinician on day of visit: allergies, medications, problem list, medical history, surgical history, family history, social history, and previous encounter notes.     Lucas Parker, MD

## 2024-09-15 ENCOUNTER — Ambulatory Visit (INDEPENDENT_AMBULATORY_CARE_PROVIDER_SITE_OTHER): Payer: Self-pay | Admitting: Internal Medicine

## 2024-09-15 LAB — HEMOGLOBIN A1C
Est. average glucose Bld gHb Est-mCnc: 123 mg/dL
Hgb A1c MFr Bld: 5.9 % — ABNORMAL HIGH (ref 4.8–5.6)

## 2024-09-15 NOTE — Progress Notes (Unsigned)
 Name: Alicia Hoover  MRN/ DOB: [id number], [birth year]    Age/ Sex: 48 y.o., female     PCP: Joshua Debby LITTIE, MD   Reason for Endocrinology Evaluation: Hyperthyroidism     Initial Endocrinology Clinic Visit: 07/10/2019    PATIENT IDENTIFIER: Alicia Hoover is a 48 y.o., female with a past medical history of HTN, Obesity and Depression. She has followed with [facility] since 07/10/2019 for consultative assistance with management of her hyperthyroidism  HISTORICAL SUMMARY: The patient was first diagnosed with subclinical hyperthyroidism in 04/2019 with a TSh of 0.31 uIU/mL . She was started on Methimazole  04/2019 TRAb elevated at 14.60 IU/L  No Amiodarone  Was on Biotin until 05/2019   No radiation exposure.   Brother with thyroid  disease   Thyroid  ultrasound revealed MNG 03/2023 with left inferior 3.3 cm nodule meeting FNA criteria.  She is s/p benign FNA of the left inferior nodule 01/2024   HYPOKALEMIA HISTORY: Pt has been noted with persistent and spontaneous hypokalemia since 08/2020 despite being on spironolactone .  Aldo was 10 mg/DL, renin was normal at 7.827 and normal Aldo/renin ratio-of note these were done while the patient on spironolactone  Patient did not submit 24-hour urinary cortisol   SUBJECTIVE:    Today (09/15/2024):  [name] is here for a follow up on hyperthyroidism secondary to Graves' disease and multinodular goiter.   The patient continues to follow-up with [facility] Denies local neck swelling  Denies diarrhea or constipation  Denies eye symptoms  Denies palpitations  Denies tremors     Methimazole  5 mg, 6 days a week ( Skips Sunday )     HISTORY:  Past Medical History:  Past Medical History:  Diagnosis Date   Acid reflux    Anxiety    Asthma    Binge eating disorder    Bladder incontinence    Depression    GERD (gastroesophageal reflux disease)    Graves disease     Hypersomnia with sleep apnea    Hypertension    Hyperthyroidism    Migraine    Obesity    OSA (obstructive sleep apnea)    Osteoarthritis of hip    Prediabetes    Rhinitis    Seasonal allergies    Vitiligo    Past Surgical History:  Past Surgical History:  Procedure Laterality Date   CHOLECYSTECTOMY     TOOTH EXTRACTION     Social History:  reports that she quit smoking about 24 years ago. Her smoking use included cigarettes. She started smoking about 25 years ago. She has a 0.1 pack-year smoking history. She has never used smokeless tobacco. She reports current alcohol use. She reports that she does not use drugs. Family History:  Family History  Problem Relation Age of Onset   Diabetes Mother    COPD Mother    Rheum arthritis Mother    Diabetes Father    Early death Father    Hyperlipidemia Brother    Thyroid  disease Brother    Alcohol abuse Neg Hx    Cancer Neg Hx    Depression Neg Hx    Drug abuse Neg Hx    Hearing loss Neg Hx    Heart disease Neg Hx    Hypertension Neg Hx    Stroke Neg Hx    Breast cancer Neg Hx      HOME MEDICATIONS: Allergies as of 09/16/2024       Reactions  Codeine Hives, Nausea And Vomiting   Tramadol  Diarrhea, Nausea And Vomiting        Medication List        Accurate as of September 15, 2024  2:33 PM. If you have any questions, ask your nurse or doctor.          albuterol  108 (90 Base) MCG/ACT inhaler Commonly known as: VENTOLIN  HFA Inhale 2 puffs into the lungs every 4 (four) hours as needed for wheezing or shortness of breath.   diclofenac  75 MG EC tablet Commonly known as: VOLTAREN  Take 1 tablet (75 mg total) by mouth 2 (two) times daily.   FIBER-VITAMINS-MINERALS PO Take by mouth.   fluocinonide -emollient 0.05 % cream Commonly known as: LIDEX -E APPLY TO AFFECTED AREA TWICE A DAY   fluticasone 50 MCG/ACT nasal spray Commonly known as: FLONASE Place 2 sprays into the nose as needed for allergies.   ibuprofen   800 MG tablet Commonly known as: ADVIL  TAKE 1 TABLET BY MOUTH EVERY 8 HOURS AS NEEDED   levocetirizine 5 MG tablet Commonly known as: XYZAL  TAKE 1 TABLET BY MOUTH EVERY DAY IN THE EVENING   metFORMIN  500 MG 24 hr tablet Commonly known as: GLUCOPHAGE -XR Take 1 tablet (500 mg total) by mouth 2 (two) times daily with a meal.   methimazole  5 MG tablet Commonly known as: TAPAZOLE  Take 1 tablet (5 mg total) by mouth as directed. 1 tablet Monday through Saturday and none on Sundays   montelukast  10 MG tablet Commonly known as: SINGULAIR  TAKE 1 TABLET BY MOUTH EVERYDAY AT BEDTIME   multivitamin tablet Take 1 tablet by mouth daily.   norgestimate -ethinyl estradiol  0.25-35 MG-MCG tablet Commonly known as: ORTHO-CYCLEN Take 1 tablet by mouth daily. Please schedule annual exam for more refills   nystatin -triamcinolone  ointment Commonly known as: MYCOLOG Apply 1 Application topically as needed.   pantoprazole  40 MG tablet Commonly known as: PROTONIX  TAKE 1 TABLET BY MOUTH TWICE A DAY   Slynd  4 MG Tabs Generic drug: Drospirenone  Take 1 tablet (4 mg total) by mouth daily.   triamterene -hydrochlorothiazide  37.5-25 MG capsule Commonly known as: DYAZIDE TAKE 1 EACH (1 CAPSULE TOTAL) BY MOUTH DAILY.   Trospium  Chloride 60 MG Cp24 Take 1 capsule (60 mg total) by mouth daily.          OBJECTIVE:   PHYSICAL EXAM: VS: LMP 09/02/2024 (Exact Date)    EXAM: General: Pt appears well and is in NAD  Neck:  Thyroid : Thyroid  size normal.  No goiter or nodules appreciated.  Lungs: Clear with good BS bilat   Heart: Auscultation: RRR.  Extremities: BL LE: Trace pretibial edema   Mental Status: Judgment, insight: Intact Orientation: Oriented to time, place, and person Mood and affect: No depression, anxiety, or agitation     DATA REVIEWED:   Latest Reference Range & Units 03/17/24 11:19  TSH mIU/L 1.21  Triiodothyronine,Free,Serum 2.3 - 4.2 pg/mL 2.8  T4,Free(Direct) 0.8 - 1.8  ng/dL 1.2      Latest Reference Range & Units 09/10/23 15:32  WBC 4.0 - 10.5 K/uL 10.5  RBC 3.87 - 5.11 Mil/uL 5.27 (H)  Hemoglobin 12.0 - 15.0 g/dL 86.5  HCT 63.9 - 53.9 % 41.5  MCV 78.0 - 100.0 fl 78.8  MCHC 30.0 - 36.0 g/dL 67.6  RDW 88.4 - 84.4 % 14.8  Platelets 150.0 - 400.0 K/uL 391.0  Neutrophils 43.0 - 77.0 % 62.8  Lymphocytes 12.0 - 46.0 % 26.9  Monocytes Relative 3.0 - 12.0 % 8.1  Eosinophil 0.0 -  5.0 % 1.8  Basophil 0.0 - 3.0 % 0.4  NEUT# 1.4 - 7.7 K/uL 6.6  Lymphs Abs 0.7 - 4.0 K/uL 2.8  Monocyte # 0.1 - 1.0 K/uL 0.9  Eosinophils Absolute 0.0 - 0.7 K/uL 0.2  Basophils Absolute 0.0 - 0.1 K/uL 0.0    Latest Reference Range & Units 09/10/23 15:32  Sodium 135 - 145 mEq/L 137  Potassium 3.5 - 5.1 mEq/L 3.8  Chloride 96 - 112 mEq/L 100  CO2 19 - 32 mEq/L 32  Glucose 70 - 99 mg/dL 92  BUN 6 - 23 mg/dL 16  Creatinine 9.59 - 8.79 mg/dL 9.08  Calcium 8.4 - 89.4 mg/dL 9.4  GFR >39.99 mL/min 74.57      Thyroid  ultrasound 04/02/2023   Estimated total number of nodules >/= 1 cm: 3   Number of spongiform nodules >/=  2 cm not described below (TR1): 0   Number of mixed cystic and solid nodules >/= 1.5 cm not described below (TR2): 0   _________________________________________________________   Nodule # 1:   Location: Isthmus; Mid   Maximum size: 2.4 cm; Other 2 dimensions: 2.0 x 2.0 cm   Composition: solid/almost completely solid (2)   Echogenicity: isoechoic (1)   Shape: not taller-than-wide (0)   Margins: ill-defined (0)   Echogenic foci: none (0)   ACR TI-RADS total points: 3.   ACR TI-RADS risk category: TR3 (3 points).   ACR TI-RADS recommendations:   *Given size (>/= 1.5 - 2.4 cm) and appearance, a follow-up ultrasound in 1 year should be considered based on TI-RADS criteria.   _________________________________________________________   Nodule # 2:   Location: Right; Inferior   Maximum size: 1.7 cm; Other 2 dimensions: 1.6 x 1.5 cm    Composition: solid/almost completely solid (2)   Echogenicity: isoechoic (1)   Shape: not taller-than-wide (0)   Margins: ill-defined (0)   Echogenic foci: none (0)   ACR TI-RADS total points: 3.   ACR TI-RADS risk category: TR3 (3 points).   ACR TI-RADS recommendations:   *Given size (>/= 1.5 - 2.4 cm) and appearance, a follow-up ultrasound in 1 year should be considered based on TI-RADS criteria.   _________________________________________________________   Nodule # 3:   Location: Left; Inferior   Maximum size: 3.3 cm; Other 2 dimensions: 3.2 x 2.6 cm   Composition: solid/almost completely solid (2)   Echogenicity: isoechoic (1)   Shape: not taller-than-wide (0)   Margins: ill-defined (0)   Echogenic foci: none (0)   ACR TI-RADS total points: 3.   ACR TI-RADS risk category: TR3 (3 points).   ACR TI-RADS recommendations:   **Given size (>/= 2.5 cm) and appearance, fine needle aspiration of this mildly suspicious nodule should be considered based on TI-RADS criteria.   _________________________________________________________   No hypervascularity or regional adenopathy.   IMPRESSION: 3.3 cm left inferior thyroid  TR 3 nodule meets criteria for biopsy as above. This correlates with the chest CT finding.   Isthmus and right inferior thyroid  TR 3 nodules meet criteria for follow-up in 1 year.     FNA left inferior Nodule  01/27/2024   Clinical History: Nodule #3: Left; Inferior, Maximum size: 3.3 cm; Other  2 dimensions: 3.2 x 2.6 cm, solid/almost completely solid, isoechoic,  TI-RADS total points: 3.  Specimen Submitted:  A. THYROID , LT INFERIOR, FINE NEEDLE ASPIRATION    FINAL MICROSCOPIC DIAGNOSIS:  - Benign follicular nodule (Bethesda category II)    Old records , labs and images have been reviewed.  ASSESSMENT / PLAN / RECOMMENDATIONS:   Hyperthyroidism Secondary to Graves' Disease:   - Clinically she is euthyroid  -She had  opted to continue on Methimazole  vs  alternative options of treatment such as radioactive iodine ablation versus surgery - TFT's remain within normal range, no change     Medications   Continue methimazole  5 mg  , 1 tablet 6 days a week     2. Graves' Disease:    - No extrathyroidal manifestations of Grave's disease.   3. Right thyroid  nodule :  -No local neck symptoms - S/p benign FNA of the left inferior nodule 01/2024 - Will repeat thyroid  ultrasound 2026    F/U in 6 months       Signed electronically by: Stefano Redgie Butts, MD  [facility]  [facility] [address] [place] [place], [place] [address] Phone: [phone] FAX: [phone]      CC: Joshua Debby CROME, MD [address] [place] [address] Phone: [phone]  Fax: [phone]   Return to Endocrinology clinic as below: Future Appointments  Date Time Provider Department Center  09/16/2024  7:30 AM Antonetta Clanton, Donell Redgie, MD [facility] None  09/25/2024  7:40 AM [facility] 3 [facility]  10/14/2024 10:40 AM Francyne Romano, MD [facility] None

## 2024-09-16 ENCOUNTER — Ambulatory Visit: Admitting: Internal Medicine

## 2024-09-16 ENCOUNTER — Other Ambulatory Visit

## 2024-09-16 ENCOUNTER — Encounter: Payer: Self-pay | Admitting: Internal Medicine

## 2024-09-16 VITALS — BP 160/96 | HR 74 | Ht 65.0 in | Wt 297.0 lb

## 2024-09-16 DIAGNOSIS — E0500 Thyrotoxicosis with diffuse goiter without thyrotoxic crisis or storm: Secondary | ICD-10-CM | POA: Diagnosis not present

## 2024-09-16 DIAGNOSIS — E0590 Thyrotoxicosis, unspecified without thyrotoxic crisis or storm: Secondary | ICD-10-CM

## 2024-09-17 LAB — T4, FREE: Free T4: 1.3 ng/dL (ref 0.8–1.8)

## 2024-09-17 LAB — TSH: TSH: 0.73 m[IU]/L

## 2024-09-17 MED ORDER — METHIMAZOLE 5 MG PO TABS: 5.0000 mg | ORAL_TABLET | ORAL | 3 refills | Status: AC

## 2024-09-25 ENCOUNTER — Ambulatory Visit

## 2024-09-29 ENCOUNTER — Encounter (INDEPENDENT_AMBULATORY_CARE_PROVIDER_SITE_OTHER): Payer: Self-pay | Admitting: Internal Medicine

## 2024-09-29 DIAGNOSIS — R7303 Prediabetes: Secondary | ICD-10-CM

## 2024-09-29 DIAGNOSIS — E88819 Insulin resistance, unspecified: Secondary | ICD-10-CM

## 2024-09-29 NOTE — Telephone Encounter (Signed)
 Dr Allie Area

## 2024-10-05 ENCOUNTER — Other Ambulatory Visit (INDEPENDENT_AMBULATORY_CARE_PROVIDER_SITE_OTHER): Payer: Self-pay | Admitting: Internal Medicine

## 2024-10-05 DIAGNOSIS — R7303 Prediabetes: Secondary | ICD-10-CM

## 2024-10-05 DIAGNOSIS — E88819 Insulin resistance, unspecified: Secondary | ICD-10-CM

## 2024-10-05 MED ORDER — METFORMIN HCL ER (MOD) 1000 MG PO TB24: 1000.0000 mg | ORAL_TABLET | Freq: Two times a day (BID) | ORAL | 0 refills | Status: DC

## 2024-10-05 NOTE — Telephone Encounter (Signed)
 Per last office note dated on 09/14/2024 provider agreed to send in new dosage of Metformin  if patient tolerated dosage increase.  we both agreed to : Increase metformin  to 1000 mg twice daily.  She will do this with current supply of medication, if tolerance is not an issue she will reach out to us  for a 90-day supply  Rx sent to pharmacy on file and patient has been notified.

## 2024-10-06 ENCOUNTER — Encounter: Payer: Self-pay | Admitting: Obstetrics and Gynecology

## 2024-10-14 ENCOUNTER — Ambulatory Visit (INDEPENDENT_AMBULATORY_CARE_PROVIDER_SITE_OTHER): Admitting: Internal Medicine

## 2024-10-19 ENCOUNTER — Encounter: Payer: Self-pay | Admitting: *Deleted

## 2024-10-29 ENCOUNTER — Ambulatory Visit (INDEPENDENT_AMBULATORY_CARE_PROVIDER_SITE_OTHER): Admitting: Internal Medicine

## 2024-10-29 ENCOUNTER — Encounter (INDEPENDENT_AMBULATORY_CARE_PROVIDER_SITE_OTHER): Payer: Self-pay | Admitting: Internal Medicine

## 2024-10-29 VITALS — BP 140/86 | HR 69 | Temp 98.4°F | Ht 65.0 in | Wt 298.0 lb

## 2024-10-29 DIAGNOSIS — E66813 Obesity, class 3: Secondary | ICD-10-CM

## 2024-10-29 DIAGNOSIS — R7303 Prediabetes: Secondary | ICD-10-CM | POA: Diagnosis not present

## 2024-10-29 DIAGNOSIS — I10 Essential (primary) hypertension: Secondary | ICD-10-CM | POA: Diagnosis not present

## 2024-10-29 DIAGNOSIS — G4733 Obstructive sleep apnea (adult) (pediatric): Secondary | ICD-10-CM | POA: Diagnosis not present

## 2024-10-29 DIAGNOSIS — Z6841 Body Mass Index (BMI) 40.0 and over, adult: Secondary | ICD-10-CM | POA: Diagnosis not present

## 2024-10-29 DIAGNOSIS — Z5941 Food insecurity: Secondary | ICD-10-CM

## 2024-10-29 MED ORDER — WEGOVY 0.25 MG/0.5ML ~~LOC~~ SOAJ: 0.2500 mg | SUBCUTANEOUS | 0 refills | Status: AC

## 2024-10-29 NOTE — Assessment & Plan Note (Signed)
 Lab Results  Component Value Date   HGBA1C 5.9 (H) 09/14/2024   HGBA1C 6.1 (H) 04/08/2024   HGBA1C 5.9 07/25/2022   Stable at this time. Glycemic control is being addressed through the weight management plan above inclusive of metformin , with expected improvement in insulin  resistance and metabolic parameters as weight loss progresses. Will continue to monitor A1c and glucose trends.

## 2024-10-29 NOTE — Assessment & Plan Note (Signed)
 Recent elevated reading likely due to missed medication dose. Blood pressure management is crucial due to comorbidities. Discussed the importance of adherence to medication regimen. - Ensure adherence to blood pressure medication regimen

## 2024-10-29 NOTE — Assessment & Plan Note (Signed)
 Class 3 obesity with a BMI of 49, associated with prediabetes, essential hypertension, and moderate sleep apnea. Recent weight gain of 2 pounds over the holidays. Previous weight loss of 26 pounds. Current dietary efforts include budget-friendly meal planning and use of Prozents meals. Exercise limited due to knee pain. Discussed potential insurance coverage for Wegovy  as a weight management option. Emphasized the importance of weight management due to increased risk of complications from comorbidities. - Referred to child psychotherapist for assistance with resources - Initiated Prep program through [employer] for exercise and nutrition support - Submitted prescription for Wegovy  and checked insurance coverage - Educated on Wegovy  injection technique - Continue current dietary efforts with focus on budget-friendly meal planning

## 2024-10-29 NOTE — Progress Notes (Signed)
 "  Office: [phone]  /  Fax: [phone]  Weight Summary and Body Composition Analysis (BIA)  Vitals Temp: 98.4 F (36.9 C) BP: (!) 140/86 Pulse Rate: 69 SpO2: 100 %   Anthropometric Measurements Height: 5' 5 (1.651 m) Weight: 298 lb (135.2 kg) BMI (Calculated): 49.59 Weight at Last Visit: 296 lb Weight Lost Since Last Visit: 0 lb Weight Gained Since Last Visit: 2 lb Starting Weight: 322 lb Total Weight Loss (lbs): 24 lb (10.9 kg) Peak Weight: 330 lb   Body Composition  Body Fat %: 51.2 % Fat Mass (lbs): 152.6 lbs Muscle Mass (lbs): 138.2 lbs Total Body Water (lbs): 100.8 lbs Visceral Fat Rating : 18    RMR: 2102  Today's Visit #: 7  Starting Date: 04/08/24   Subjective   Chief Complaint: Obesity  Interval History  Discussed the use of AI scribe software for clinical note transcription with the patient, who gave verbal consent to proceed.  History of Present Illness Alicia Hoover is a 49 year old female with prediabetes, insulin  resistance, and essential hypertension who presents for medical weight management.  She has experienced a weight gain of two pounds over the holidays. She follows a 1500 calorie diet but adheres to it only about 40% of the time. Exercise has been limited due to knee problems. She is managing her expenses and seeks cost-effective meal planning options, incorporating protein soups and salad kits with chickpeas into her diet.  She has prediabetes and insulin  resistance, with her metformin  dose increased to 2000 mg per day during her last visit. Initially, she experienced gastrointestinal side effects, but she now tolerates the medication well. No significant changes in hunger, fullness, or cravings for sweets have been noted since the dose increase.  She has essential hypertension and forgot to take her blood pressure medication this morning, planning to take it upon returning home. She has a family history of complications  related to hypertension.  She has sleep apnea and regularly uses a CPAP machine, feeling more rested in the mornings and experiencing headaches if she misses a night of use.     Challenges affecting patient progress: cost of healthy foods.    Pharmacotherapy for weight management: She is currently taking Metformin  (off label use for weight management and / or insulin  resistance and / or diabetes prevention) with adequate clinical response  and without side effects..   Assessment and Plan   Treatment Plan For Obesity:  Recommended Dietary Goals  Alicia Hoover is currently in the action stage of change. As such, her goal is to continue weight management plan. She has agreed to: follow a balanced (30%/40%/30%), whole foods-based, reduced-calorie meal plan (RCNP) targeting 1500 kcal per day  Behavioral Health and Counseling  We discussed the following behavioral modification strategies today: continue to work on maintaining a reduced calorie state, getting the recommended amount of protein, incorporating whole foods, making healthy choices, staying well hydrated and practicing mindfulness when eating. and increase protein intake, fibrous foods (25 grams per day for women, 30 grams for men) and water to improve satiety and decrease hunger signals. .  Additional education and resources provided today: None  Recommended Physical Activity Goals  Alicia Hoover has been advised to work up to 150 minutes of moderate intensity aerobic activity a week and strengthening exercises 2-3 times per week for cardiovascular health, weight loss maintenance and preservation of muscle mass.  She has agreed to :  Think about enjoyable ways to increase daily physical activity and overcoming barriers  to exercise, Increase physical activity in their day and reduce sedentary time (increase NEAT)., Increase volume of physical activity to a goal of 240 minutes a week, and Combine aerobic and strengthening exercises for efficiency  and improved cardiometabolic health.  Medical Interventions and Pharmacotherapy  We discussed various medication options to help Alicia Hoover with her weight loss efforts and we both agreed to : Adequate clinical response to anti-obesity medication, continue current anti-obesity regimen  Associated Conditions Impacted by Obesity Treatment  Assessment & Plan Food insecurity Challenges in meal planning and budgeting. Utilizing Prozents meals and exploring budget-friendly options. Discussed use of Food Finder app for [employer]. - Referred to social worker for assistance with resources - Encouraged use of Catering Manager for marsh[employer] - Provided guidance on budget-friendly meal planning, including bean-based dishes Prediabetes Lab Results  Component Value Date   HGBA1C 5.9 (H) 09/14/2024   HGBA1C 6.1 (H) 04/08/2024   HGBA1C 5.9 07/25/2022   Stable at this time. Glycemic control is being addressed through the weight management plan above inclusive of metformin , with expected improvement in insulin  resistance and metabolic parameters as weight loss progresses. Will continue to monitor A1c and glucose trends.   Class 3 severe obesity with serious comorbidity and body mass index (BMI) of 45.0 to 49.9 in adult, unspecified obesity type (HCC) Class 3 obesity with a BMI of 49, associated with prediabetes, essential hypertension, and moderate sleep apnea. Recent weight gain of 2 pounds over the holidays. Previous weight loss of 26 pounds. Current dietary efforts include budget-friendly meal planning and use of Prozents meals. Exercise limited due to knee pain. Discussed potential insurance coverage for Wegovy  as a weight management option. Emphasized the importance of weight management due to increased risk of complications from comorbidities. - Referred to child psychotherapist for assistance with resources - Initiated Prep program through [facility] for exercise and nutrition support - Submitted  prescription for Wegovy  and checked insurance coverage - Educated on Wegovy  injection technique - Continue current dietary efforts with focus on budget-friendly meal planning Essential hypertension, benign Recent elevated reading likely due to missed medication dose. Blood pressure management is crucial due to comorbidities. Discussed the importance of adherence to medication regimen. - Ensure adherence to blood pressure medication regimen OSA (obstructive sleep apnea) Moderate severity sleep apnea managed with CPAP. Reports feeling more rested with consistent use. Missed nights result in headaches. - Continue CPAP therapy          Objective   Physical Exam:  Blood pressure (!) 140/86, pulse 69, temperature 98.4 F (36.9 C), height 5' 5 (1.651 m), weight 298 lb (135.2 kg), last menstrual period 10/12/2024, SpO2 100%. Body mass index is 49.59 kg/m.  General: She is overweight, cooperative, alert, well developed, and in no acute distress. PSYCH: Has normal mood, affect and thought process.   HEENT: EOMI, sclerae are anicteric. Lungs: Normal breathing effort, no conversational dyspnea. Extremities: No edema.  Neurologic: No gross sensory or motor deficits. No tremors or fasciculations noted.    Diagnostic Data Reviewed:  BMET    Component Value Date/Time   NA 139 04/08/2024 1024   K 3.8 04/08/2024 1024   CL 99 04/08/2024 1024   CO2 20 04/08/2024 1024   GLUCOSE 91 04/08/2024 1024   GLUCOSE 92 09/10/2023 1532   BUN 13 04/08/2024 1024   CREATININE 0.96 04/08/2024 1024   CALCIUM 9.4 04/08/2024 1024   GFRNONAA >60 10/03/2020 1347   GFRAA >60 10/31/2016 1530   Lab Results  Component Value  Date   HGBA1C 5.9 (H) 09/14/2024   HGBA1C 6.1 02/24/2014   Lab Results  Component Value Date   INSULIN  26.6 (H) 04/08/2024   Lab Results  Component Value Date   TSH 0.73 09/16/2024   CBC    Component Value Date/Time   WBC 9.7 04/08/2024 1024   WBC 10.5 09/10/2023 1532   RBC  5.23 04/08/2024 1024   RBC 5.27 (H) 09/10/2023 1532   HGB 13.4 04/08/2024 1024   HCT 42.6 04/08/2024 1024   PLT 377 04/08/2024 1024   MCV 82 04/08/2024 1024   MCH 25.6 (L) 04/08/2024 1024   MCH 26.4 10/03/2020 1347   MCHC 31.5 04/08/2024 1024   MCHC 32.3 09/10/2023 1532   RDW 14.2 04/08/2024 1024   Iron Studies No results found for: IRON, TIBC, FERRITIN, IRONPCTSAT Lipid Panel     Component Value Date/Time   CHOL 131 04/08/2024 1024   TRIG 150 (H) 04/08/2024 1024   HDL 43 04/08/2024 1024   CHOLHDL 4 10/05/2021 0953   VLDL 28.0 10/05/2021 0953   LDLCALC 62 04/08/2024 1024   LDLDIRECT 57.0 03/27/2017 1511   Hepatic Function Panel     Component Value Date/Time   PROT 7.0 04/08/2024 1024   ALBUMIN 4.2 04/08/2024 1024   AST 25 04/08/2024 1024   ALT 24 04/08/2024 1024   ALKPHOS 74 04/08/2024 1024   BILITOT 0.3 04/08/2024 1024   BILIDIR 0.1 03/18/2023 1614      Component Value Date/Time   TSH 0.73 09/16/2024 0804   Nutritional Lab Results  Component Value Date   VD25OH 53.1 04/08/2024   VD25OH 36.31 04/08/2019    Medications: Outpatient Encounter Medications as of 10/29/2024  Medication Sig   albuterol  (VENTOLIN  HFA) 108 (90 Base) MCG/ACT inhaler Inhale 2 puffs into the lungs every 4 (four) hours as needed for wheezing or shortness of breath.   diclofenac  (VOLTAREN ) 75 MG EC tablet Take 1 tablet (75 mg total) by mouth 2 (two) times daily.   Drospirenone  (SLYND ) 4 MG TABS Take 1 tablet (4 mg total) by mouth daily.   FIBER-VITAMINS-MINERALS PO Take by mouth.   fluocinonide -emollient (LIDEX -E) 0.05 % cream APPLY TO AFFECTED AREA TWICE A DAY   fluticasone (FLONASE) 50 MCG/ACT nasal spray Place 2 sprays into the nose as needed for allergies.   ibuprofen  (ADVIL ) 800 MG tablet TAKE 1 TABLET BY MOUTH EVERY 8 HOURS AS NEEDED   levocetirizine (XYZAL ) 5 MG tablet TAKE 1 TABLET BY MOUTH EVERY DAY IN THE EVENING   metFORMIN  (GLUCOPHAGE -XR) 500 MG 24 hr tablet Take 2  tablets (1,000 mg total) by mouth 2 (two) times daily with a meal.   methimazole  (TAPAZOLE ) 5 MG tablet Take 1 tablet (5 mg total) by mouth as directed. 1 tablet Monday through Saturday and none on Sundays   montelukast  (SINGULAIR ) 10 MG tablet TAKE 1 TABLET BY MOUTH EVERYDAY AT BEDTIME   Multiple Vitamin (MULTIVITAMIN) tablet Take 1 tablet by mouth daily.   norgestimate -ethinyl estradiol  (ORTHO-CYCLEN) 0.25-35 MG-MCG tablet Take 1 tablet by mouth daily. Please schedule annual exam for more refills   nystatin -triamcinolone  ointment (MYCOLOG) Apply 1 Application topically as needed.   pantoprazole  (PROTONIX ) 40 MG tablet TAKE 1 TABLET BY MOUTH TWICE A DAY   triamterene -hydrochlorothiazide  (DYAZIDE) 37.5-25 MG capsule TAKE 1 EACH (1 CAPSULE TOTAL) BY MOUTH DAILY.   Trospium  Chloride 60 MG CP24 Take 1 capsule (60 mg total) by mouth daily.   [DISCONTINUED] dexlansoprazole  (DEXILANT ) 60 MG capsule Take 1 capsule (60 mg  total) by mouth daily.   No facility-administered encounter medications on file as of 10/29/2024.     Follow-Up   No follow-ups on file.SABRA She was informed of the importance of frequent follow up visits to maximize her success with intensive lifestyle modifications for her multiple health conditions.  Attestation Statement   Reviewed by clinician on day of visit: allergies, medications, problem list, medical history, surgical history, family history, social history, and previous encounter notes.     Lucas Parker, MD  "

## 2024-10-29 NOTE — Assessment & Plan Note (Signed)
 Moderate severity sleep apnea managed with CPAP. Reports feeling more rested with consistent use. Missed nights result in headaches. - Continue CPAP therapy

## 2024-11-02 ENCOUNTER — Telehealth: Payer: Self-pay

## 2024-11-02 NOTE — Telephone Encounter (Signed)
 Is interested in attending the [facility] PREP Class starting Feb 2. Scheduled initial assessment for Jan 29th at 12:00.

## 2024-11-02 NOTE — Progress Notes (Unsigned)
 Complex Care Management Note Care Guide Note  11/02/2024 Name: Alicia Hoover MRN: [id number] DOB: [birth year]   Complex Care Management Outreach Attempts: An unsuccessful telephone outreach was attempted today to offer the patient information about available complex care management services.  Follow Up Plan:  Additional outreach attempts will be made to offer the patient complex care management information and services.   Encounter Outcome:  No Answer  Doyce [facility]  [facility], [facility] Guide Direct Dial: [phone]  Fax: [phone]

## 2024-11-03 NOTE — Progress Notes (Signed)
 Complex Care Management Note  Care Guide Note 11/03/2024 Name: [name] MRN: [id number] DOB: 1975/12/08  [name] is a 49 y.o. year old female who sees Joshua Debby LITTIE, MD for primary care. I reached out to [name] by phone today to offer complex care management services.  Ms. Umeda was given information about Complex Care Management services today including:   The Complex Care Management services include support from the care team which includes your Nurse Care Manager, Clinical Social Worker, or Pharmacist.  The Complex Care Management team is here to help remove barriers to the health concerns and goals most important to you. Complex Care Management services are voluntary, and the patient may decline or stop services at any time by request to their care team member.   Complex Care Management Consent Status: Patient agreed to services and verbal consent obtained.   Follow up plan:  Telephone appointment with complex care management team member scheduled for:  11/04/2024 and 11/16/2024.  Encounter Outcome:  Patient Scheduled  Doyce Razor [facility]  [facility], [facility] Guide Direct Dial: [phone]  Fax: [phone]

## 2024-11-04 ENCOUNTER — Telehealth: Payer: Self-pay

## 2024-11-05 ENCOUNTER — Other Ambulatory Visit: Payer: Self-pay

## 2024-11-05 ENCOUNTER — Other Ambulatory Visit: Payer: Self-pay | Admitting: Internal Medicine

## 2024-11-05 DIAGNOSIS — I10 Essential (primary) hypertension: Secondary | ICD-10-CM

## 2024-11-05 NOTE — Patient Outreach (Signed)
 Social Drivers of Health  Community Resource and Care Coordination Visit Note   11/05/2024  Name: Alicia Hoover MRN: [id number] DOB:Feb 20, 1976  Situation: Referral received for [facility] needs assessment and assistance related to Housing  Financial [employer] . I obtained verbal consent from Patient.  Visit completed with Patient on the phone.   Background:   SDOH Interventions Today    Flowsheet Row Most Recent Value  SDOH Interventions   Food Insecurity Interventions Community Resources Provided  Housing Interventions Community Resources Provided  Utilities Interventions Community Resources Provided  Financial Strain Interventions Community Resources Provided     Assessment:   BSW outreached patient today to assess for SDOH barriers during the call. During the assessment, food insecurity, housing instability, and utility concerns were identified as barriers. Patient stated that she is currently living in a home with her daughter. She reported that she has been behind on rent in the past; however, she has not been evicted at this time. Patient stated that she does not receive SNAP benefits due to her income but has accessed food assistance through [employer]. Patient also reported difficulty keeping up with utility payments and shared that her gas bill is currently overdue. Patient stated that she applied for the [facility] program this year but was informed that funding had been exhausted. BSW will send the patient a list of community resources to assist with food and utility support. Patient was encouraged to reach out with any questions or additional needs.    Goals Addressed             This Visit's Progress    COMPLETED: BSW Goals       Current SDOH Barriers:  Limited access to food Housing barriers  Interventions: Patient interviewed and appropriate screenings performed Referred patient to community resources           Recommendation:    attend all scheduled provider appointments call for transportation assistance at least one week before appointments ask for help if you don't understand your health insurance benefits  Follow Up Plan:   Patient has achieved all patient stated goals. [employer] will be closed. Patient has been provided contact information should new needs arise.   Orlean Fey, BSW [place]  [facility] Social Worker, [employer] [facility] [phone]

## 2024-11-05 NOTE — Patient Instructions (Signed)
 Visit Information  Thank you for taking time to visit with me today. Please don't hesitate to contact me if I can be of assistance to you before our next scheduled appointment.  Our next appointment is no further scheduled appointments.   Please call the care guide team at [phone] if you need to cancel or reschedule your appointment.   Following is a copy of your care plan:   Goals Addressed             This Visit's Progress    COMPLETED: BSW Goals       Current SDOH Barriers:  Limited access to food Housing barriers  Interventions: Patient interviewed and appropriate screenings performed Referred patient to community resources           Please call the Suicide and Crisis Lifeline: 988 call the [place]  National Suicide Prevention Lifeline: 479 745 4706 or TTY: (431)054-0244 TTY 586-597-4920) to talk to a trained counselor call 1-800-273-TALK (toll free, 24 hour hotline) go to [facility] [address] [place] [phone]) call 911 if you are experiencing a Mental Health or Behavioral Health Crisis or need someone to talk to.  Patient verbalized understanding of Care plan and visit instructions communicated this visit  Orlean Fey, BSW [facility]  [facility] Social Worker, [employer] [facility] [phone]

## 2024-11-05 NOTE — Progress Notes (Signed)
 [facility] PREP Evaluation  Patient Details  Name: Alicia Hoover MRN: [id number] Date of Birth: 1976/04/26 Age: 49 y.o. PCP: Joshua Debby LITTIE, MD  Vitals:   11/05/24 1234  BP: 138/84  Weight: (!) 300 lb 4.8 oz (136.2 kg)     [facility] Eval - 11/05/24 1200       [facility] PREP Location   [facility] PREP Location [name]      Referral    Referring Provider [name]    Reason for referral Hypertension;Obesitity/Overweight    Program Start Date 11/09/24      Measurement   Waist Circumference 60.25 inches    Hip Circumference 59.75 inches      Information for Trainer   Goals --   Lose Weight   Current Exercise --   None   Orthopedic Concerns --   Knees, hips, arthritis   Current Barriers --   Work     Mobility and Daily Activities   I find it easy to walk up or down two or more flights of stairs. 1    I have no trouble taking out the trash. 4    I do housework such as vacuuming and dusting on my own without difficulty. 4    I can easily lift a gallon of milk (8lbs). 4    I can easily walk a mile. 1    I have no trouble reaching into high cupboards or reaching down to pick up something from the floor. 4    I do not have trouble doing out-door work such as loss adjuster, chartered, raking leaves, or gardening. 1      Mobility and Daily Activities   I feel younger than my age. 2    I feel independent. 4    I feel energetic. 2    I live an active life.  2    I feel strong. 2    I feel healthy. 1    I feel active as other people my age. 1      How fit and strong are you.   Fit and Strong Total Score 33         Past Medical History:  Diagnosis Date   Acid reflux    Anxiety    Asthma    Binge eating disorder    Bladder incontinence    Depression    GERD (gastroesophageal reflux disease)    Graves disease    Hypersomnia with sleep apnea    Hypertension    Hyperthyroidism    Migraine    Obesity    OSA (obstructive sleep apnea)    Osteoarthritis of hip     Prediabetes    Rhinitis    Seasonal allergies    Vitiligo    Past Surgical History:  Procedure Laterality Date   CHOLECYSTECTOMY     TOOTH EXTRACTION     Tobacco Use History[1]  [name] is ready to start PREP class at [name] on February 2, MW at 1:00.  [name] 11/05/2024, 12:38 PM      [1]  Social History Tobacco Use  Smoking Status Former   Current packs/day: 0.00   Average packs/day: 0.1 packs/day for 1 year (0.1 ttl pk-yrs)   Types: Cigarettes   Start date: 10/08/1998   Quit date: 10/09/1999   Years since quitting: 25.0  Smokeless Tobacco Never

## 2024-11-13 ENCOUNTER — Ambulatory Visit

## 2024-11-16 ENCOUNTER — Telehealth

## 2024-11-25 ENCOUNTER — Ambulatory Visit (INDEPENDENT_AMBULATORY_CARE_PROVIDER_SITE_OTHER): Admitting: Internal Medicine

## 2024-11-27 ENCOUNTER — Ambulatory Visit

## 2025-03-17 ENCOUNTER — Ambulatory Visit: Admitting: Internal Medicine
